# Patient Record
Sex: Female | Born: 1980 | Race: Black or African American | Hispanic: No | Marital: Single | Smoking: Current every day smoker
Health system: Southern US, Community
[De-identification: ages and names within clinical notes are randomized; demographics above are authoritative.]

## PROBLEM LIST (undated history)

## (undated) DIAGNOSIS — T7840XA Allergy, unspecified, initial encounter: Secondary | ICD-10-CM

## (undated) DIAGNOSIS — F329 Major depressive disorder, single episode, unspecified: Secondary | ICD-10-CM

## (undated) DIAGNOSIS — F32A Depression, unspecified: Secondary | ICD-10-CM

## (undated) DIAGNOSIS — F419 Anxiety disorder, unspecified: Secondary | ICD-10-CM

## (undated) DIAGNOSIS — I10 Essential (primary) hypertension: Secondary | ICD-10-CM

## (undated) DIAGNOSIS — E237 Disorder of pituitary gland, unspecified: Secondary | ICD-10-CM

## (undated) DIAGNOSIS — D509 Iron deficiency anemia, unspecified: Secondary | ICD-10-CM

## (undated) DIAGNOSIS — K5900 Constipation, unspecified: Secondary | ICD-10-CM

## (undated) DIAGNOSIS — E039 Hypothyroidism, unspecified: Secondary | ICD-10-CM

## (undated) DIAGNOSIS — I272 Pulmonary hypertension, unspecified: Secondary | ICD-10-CM

## (undated) HISTORY — DX: Disorder of pituitary gland, unspecified: E23.7

## (undated) HISTORY — DX: Pulmonary hypertension, unspecified: I27.20

## (undated) HISTORY — DX: Iron deficiency anemia, unspecified: D50.9

## (undated) HISTORY — PX: TUBAL LIGATION: SHX77

## (undated) HISTORY — PX: OTHER SURGICAL HISTORY: SHX169

## (undated) HISTORY — DX: Allergy, unspecified, initial encounter: T78.40XA

## (undated) HISTORY — PX: BREAST CYST EXCISION: SHX579

## (undated) HISTORY — PX: COLONOSCOPY: SHX174

## (undated) HISTORY — DX: Constipation, unspecified: K59.00

## (undated) HISTORY — PX: UPPER GASTROINTESTINAL ENDOSCOPY: SHX188

## (undated) HISTORY — DX: Hypothyroidism, unspecified: E03.9

---

## 2008-03-01 ENCOUNTER — Emergency Department (HOSPITAL_COMMUNITY): Admission: EM | Admit: 2008-03-01 | Discharge: 2008-03-02 | Payer: Self-pay | Admitting: Emergency Medicine

## 2010-12-23 ENCOUNTER — Encounter: Payer: Self-pay | Admitting: *Deleted

## 2010-12-23 ENCOUNTER — Emergency Department (INDEPENDENT_AMBULATORY_CARE_PROVIDER_SITE_OTHER): Payer: Medicaid Other

## 2010-12-23 ENCOUNTER — Emergency Department (HOSPITAL_BASED_OUTPATIENT_CLINIC_OR_DEPARTMENT_OTHER)
Admission: EM | Admit: 2010-12-23 | Discharge: 2010-12-23 | Disposition: A | Discharge status: Home / Self Care | Payer: Medicaid Other | Attending: Emergency Medicine | Admitting: Emergency Medicine

## 2010-12-23 DIAGNOSIS — F17200 Nicotine dependence, unspecified, uncomplicated: Secondary | ICD-10-CM | POA: Insufficient documentation

## 2010-12-23 DIAGNOSIS — IMO0001 Reserved for inherently not codable concepts without codable children: Secondary | ICD-10-CM | POA: Insufficient documentation

## 2010-12-23 DIAGNOSIS — I10 Essential (primary) hypertension: Secondary | ICD-10-CM | POA: Insufficient documentation

## 2010-12-23 DIAGNOSIS — M79609 Pain in unspecified limb: Secondary | ICD-10-CM

## 2010-12-23 DIAGNOSIS — M791 Myalgia, unspecified site: Secondary | ICD-10-CM

## 2010-12-23 DIAGNOSIS — R0602 Shortness of breath: Secondary | ICD-10-CM | POA: Insufficient documentation

## 2010-12-23 HISTORY — DX: Essential (primary) hypertension: I10

## 2010-12-23 NOTE — ED Notes (Signed)
Pt sts she is concerned about "discoloration" to LLE. Pt denies injury and sts "it just appeared when I was in the shower". Pt sts she feels short of breath since last night. Pt speaking in full sentences, sp02 100%, nad noted.

## 2010-12-23 NOTE — ED Notes (Signed)
Pt c/o left leg pain x 2 weeks with SOB x 2 days

## 2010-12-23 NOTE — ED Provider Notes (Signed)
History     Chief Complaint  Patient presents with  . Leg Pain  . Shortness of Breath   Patient is a 30 y.o. female presenting with leg pain. The history is provided by the patient.  Leg Pain  The incident occurred yesterday. The pain is present in the left leg. The quality of the pain is described as sharp. The pain is moderate. The pain has been constant since onset. Pertinent negatives include no numbness and no tingling. She reports no foreign bodies present. The symptoms are aggravated by palpation. She has tried nothing for the symptoms.  pt c/o left lower leg pain for past day. No hx same. Denies injury. No swelling. No cp or sob. No hx dvt or pe.   Past Medical History  Diagnosis Date  . Hypertension     Past Surgical History  Procedure Date  . Cesarean section   . Breast surgery   . Tubal ligation     History reviewed. No pertinent family history.  History  Substance Use Topics  . Smoking status: Current Everyday Smoker -- 0.5 packs/day  . Smokeless tobacco: Not on file  . Alcohol Use: No    OB History    Grav Para Term Preterm Abortions TAB SAB Ect Mult Living                  Review of Systems  Constitutional: Negative for fever and chills.  HENT: Negative for neck pain.   Eyes: Negative for redness.  Respiratory: Negative for shortness of breath.   Cardiovascular: Negative for chest pain.  Gastrointestinal: Negative for abdominal pain.  Genitourinary: Negative for dysuria.  Skin: Negative for rash.  Neurological: Negative for tingling, numbness and headaches.  Hematological: Does not bruise/bleed easily.  Psychiatric/Behavioral: Negative for behavioral problems.    Physical Exam  BP 138/87  Pulse 94  Temp 98.9 F (37.2 C)  Resp 18  Wt 195 lb (88.451 kg)  SpO2 100%  LMP 12/09/2010  Physical Exam  Nursing note and vitals reviewed. Constitutional: She appears well-developed and well-nourished. No distress.  Eyes: Conjunctivae are normal. No  scleral icterus.  Neck: Neck supple. No tracheal deviation present.  Cardiovascular: Normal rate and normal heart sounds.  Exam reveals no gallop and no friction rub.   No murmur heard. Pulmonary/Chest: Effort normal and breath sounds normal. No respiratory distress.  Abdominal: Normal appearance. She exhibits no distension.  Musculoskeletal: She exhibits no edema.       Tenderness left lower leg laterally. No skin changes or erythema. No sts. No palp cord. Distal pulses palp. Good rom at knee/ankle without pain  Neurological: She is alert.  Skin: Skin is warm and dry. No rash noted.  Psychiatric: She has a normal mood and affect.    ED Course  Procedures  MDM Vascular dopplers ordered Dopplers neg for dvt. Discussed results w pt. No sob/ no cp.      Suzi Roots, MD 12/23/10 [phone]

## 2011-04-28 ENCOUNTER — Other Ambulatory Visit: Payer: Self-pay | Admitting: Internal Medicine

## 2011-04-28 DIAGNOSIS — E010 Iodine-deficiency related diffuse (endemic) goiter: Secondary | ICD-10-CM

## 2011-05-11 ENCOUNTER — Other Ambulatory Visit: Payer: Self-pay

## 2011-05-11 ENCOUNTER — Ambulatory Visit
Admission: RE | Admit: 2011-05-11 | Discharge: 2011-05-11 | Disposition: A | Discharge status: Home / Self Care | Payer: Medicaid Other | Source: Ambulatory Visit | Attending: Internal Medicine | Admitting: Internal Medicine

## 2011-05-11 DIAGNOSIS — E010 Iodine-deficiency related diffuse (endemic) goiter: Secondary | ICD-10-CM

## 2011-06-07 ENCOUNTER — Emergency Department (INDEPENDENT_AMBULATORY_CARE_PROVIDER_SITE_OTHER): Payer: Medicaid Other

## 2011-06-07 ENCOUNTER — Emergency Department (HOSPITAL_BASED_OUTPATIENT_CLINIC_OR_DEPARTMENT_OTHER)
Admission: EM | Admit: 2011-06-07 | Discharge: 2011-06-07 | Disposition: A | Discharge status: Home / Self Care | Payer: Medicaid Other | Attending: Emergency Medicine | Admitting: Emergency Medicine

## 2011-06-07 ENCOUNTER — Encounter (HOSPITAL_BASED_OUTPATIENT_CLINIC_OR_DEPARTMENT_OTHER): Payer: Self-pay | Admitting: Emergency Medicine

## 2011-06-07 DIAGNOSIS — M898X9 Other specified disorders of bone, unspecified site: Secondary | ICD-10-CM

## 2011-06-07 DIAGNOSIS — I10 Essential (primary) hypertension: Secondary | ICD-10-CM | POA: Insufficient documentation

## 2011-06-07 DIAGNOSIS — M7989 Other specified soft tissue disorders: Secondary | ICD-10-CM

## 2011-06-07 DIAGNOSIS — F17200 Nicotine dependence, unspecified, uncomplicated: Secondary | ICD-10-CM | POA: Insufficient documentation

## 2011-06-07 DIAGNOSIS — M25669 Stiffness of unspecified knee, not elsewhere classified: Secondary | ICD-10-CM

## 2011-06-07 DIAGNOSIS — M25462 Effusion, left knee: Secondary | ICD-10-CM

## 2011-06-07 DIAGNOSIS — M25469 Effusion, unspecified knee: Secondary | ICD-10-CM | POA: Insufficient documentation

## 2011-06-07 DIAGNOSIS — M25569 Pain in unspecified knee: Secondary | ICD-10-CM

## 2011-06-07 MED ORDER — IBUPROFEN 800 MG PO TABS: 800.0000 mg | ORAL_TABLET | Freq: Three times a day (TID) | ORAL | Status: AC

## 2011-06-07 NOTE — ED Provider Notes (Signed)
History     CSN: [id number]  Arrival date & time 06/07/11  1623   First MD Initiated Contact with Patient 06/07/11 1658      Chief Complaint  Patient presents with  . Knee Pain    (Consider location/radiation/quality/duration/timing/severity/associated sxs/prior treatment) Patient is a 30 y.o. female presenting with knee pain. The history is provided by the patient. No language interpreter was used.  Knee Pain This is a new problem. The current episode started yesterday. The problem occurs constantly. The problem has been gradually worsening. Associated symptoms include joint swelling. Pertinent negatives include no fever or rash. The symptoms are aggravated by bending. She has tried nothing for the symptoms.  Pt reports left knee is swollen and painful.  Pt denies any injury.  Past Medical History  Diagnosis Date  . Hypertension     Past Surgical History  Procedure Date  . Cesarean section   . Breast surgery   . Tubal ligation     No family history on file.  History  Substance Use Topics  . Smoking status: Current Everyday Smoker -- 0.5 packs/day  . Smokeless tobacco: Not on file  . Alcohol Use: No    OB History    Grav Para Term Preterm Abortions TAB SAB Ect Mult Living                  Review of Systems  Constitutional: Negative for fever.  Musculoskeletal: Positive for joint swelling.  Skin: Negative for rash.  All other systems reviewed and are negative.    Allergies  Codeine; Darvocet; Percocet; and Tylenol  Home Medications   Current Outpatient Rx  Name Route Sig Dispense Refill  . BIOTIN 10 MG PO TABS Oral Take 1 tablet by mouth daily.      Marland Kitchen ONE-DAILY MULTI VITAMINS PO TABS Oral Take 1 tablet by mouth daily.        BP 143/86  Pulse 78  Temp(Src) 98.5 F (36.9 C) (Oral)  Resp 16  SpO2 100%  LMP 06/07/2011  Physical Exam  Nursing note and vitals reviewed. Constitutional: She appears well-developed and well-nourished.  HENT:  Head:  Normocephalic and atraumatic.  Eyes: Pupils are equal, round, and reactive to light.  Musculoskeletal: She exhibits edema and tenderness.  Neurological: She is alert.  Skin: Skin is warm.  Psychiatric: She has a normal mood and affect.    ED Course  Procedures (including critical care time)  Labs Reviewed - No data to display Dg Knee Complete 4 Views Left  06/07/2011  *RADIOLOGY REPORT*  Clinical Data: 30 year old female with left anterior knee pain, swelling and stiffness.  LEFT KNEE - COMPLETE 4+ VIEW  Comparison: None.  Findings: No definite joint effusion.  Degenerative spurring of the patella.  Joint spaces appear relatively preserved.  Mild medial compartment spurring.  No acute fracture or dislocation.  IMPRESSION: Mild degenerative spurring. No acute osseous abnormality identified about the left knee.  Original Report Authenticated By: Harley Hallmark, M.D.     No diagnosis found.    MDM  Pt placed in knee imbolizer,  I advised follow up with orthopaedist for recheck.  rx for [employer], [place] 06/07/11 1750

## 2011-06-07 NOTE — ED Notes (Signed)
Pt c/o pain to LT knee & edema since yesterday, no known injury; also c/o numbess, tingling in hands intermittently x "weeks"

## 2011-06-08 NOTE — ED Provider Notes (Signed)
History/physical exam/procedure(s) were performed by non-physician practitioner and as supervising physician I was immediately available for consultation/collaboration. I have reviewed all notes and am in agreement with care and plan.   Bransen Fassnacht S Evan Mackie, MD 06/08/11 0720 

## 2011-06-26 ENCOUNTER — Encounter (HOSPITAL_BASED_OUTPATIENT_CLINIC_OR_DEPARTMENT_OTHER): Payer: Self-pay | Admitting: *Deleted

## 2011-06-26 ENCOUNTER — Emergency Department (HOSPITAL_BASED_OUTPATIENT_CLINIC_OR_DEPARTMENT_OTHER)
Admission: EM | Admit: 2011-06-26 | Discharge: 2011-06-26 | Disposition: A | Discharge status: Home / Self Care | Payer: Medicaid Other | Attending: Emergency Medicine | Admitting: Emergency Medicine

## 2011-06-26 DIAGNOSIS — N9489 Other specified conditions associated with female genital organs and menstrual cycle: Secondary | ICD-10-CM | POA: Insufficient documentation

## 2011-06-26 DIAGNOSIS — R102 Pelvic and perineal pain: Secondary | ICD-10-CM

## 2011-06-26 DIAGNOSIS — N760 Acute vaginitis: Secondary | ICD-10-CM | POA: Insufficient documentation

## 2011-06-26 DIAGNOSIS — A499 Bacterial infection, unspecified: Secondary | ICD-10-CM | POA: Insufficient documentation

## 2011-06-26 DIAGNOSIS — F17200 Nicotine dependence, unspecified, uncomplicated: Secondary | ICD-10-CM | POA: Insufficient documentation

## 2011-06-26 DIAGNOSIS — I10 Essential (primary) hypertension: Secondary | ICD-10-CM | POA: Insufficient documentation

## 2011-06-26 DIAGNOSIS — B9689 Other specified bacterial agents as the cause of diseases classified elsewhere: Secondary | ICD-10-CM | POA: Insufficient documentation

## 2011-06-26 DIAGNOSIS — R109 Unspecified abdominal pain: Secondary | ICD-10-CM | POA: Insufficient documentation

## 2011-06-26 LAB — URINALYSIS, ROUTINE W REFLEX MICROSCOPIC
Glucose, UA: NEGATIVE mg/dL
Hgb urine dipstick: NEGATIVE
Specific Gravity, Urine: 1.015 (ref 1.005–1.030)

## 2011-06-26 LAB — WET PREP, GENITAL
Trich, Wet Prep: NONE SEEN
Yeast Wet Prep HPF POC: NONE SEEN

## 2011-06-26 LAB — PREGNANCY, URINE: Preg Test, Ur: NEGATIVE

## 2011-06-26 MED ORDER — DOXYCYCLINE HYCLATE 100 MG PO CAPS: 100.0000 mg | ORAL_CAPSULE | Freq: Two times a day (BID) | ORAL | Status: AC

## 2011-06-26 MED ORDER — METRONIDAZOLE 500 MG PO TABS: 500.0000 mg | ORAL_TABLET | Freq: Two times a day (BID) | ORAL | Status: AC

## 2011-06-26 MED ORDER — FLUCONAZOLE 200 MG PO TABS: 200.0000 mg | ORAL_TABLET | Freq: Every day | ORAL | Status: AC

## 2011-06-26 MED ORDER — TRAMADOL HCL 50 MG PO TABS: 50.0000 mg | ORAL_TABLET | Freq: Four times a day (QID) | ORAL | Status: AC | PRN

## 2011-06-26 MED ORDER — CEFTRIAXONE SODIUM 250 MG IJ SOLR
250.0000 mg | Freq: Once | INTRAMUSCULAR | Status: AC
  Administered 2011-06-26: 250 mg via INTRAMUSCULAR
  Filled 2011-06-26: qty 250

## 2011-06-26 NOTE — ED Provider Notes (Signed)
History     CSN: [id number]  Arrival date & time 06/26/11  4098   First MD Initiated Contact with Patient 06/26/11 1002      Chief Complaint  Patient presents with  . Abdominal Pain    (Consider location/radiation/quality/duration/timing/severity/associated sxs/prior treatment) HPI Comments: Patient presents with low back pain across both sides of her low back. This started about 2 weeks ago. It is worse after intercourse. Is not worse with movement. Also complains now of some crampy pain to her lower abdomen. Denies any vaginal bleeding. She does complain of some vaginal discharge. No nausea no vomiting no fevers no difficulty urinating. She was recently treated for bacterial vaginosis and yeast infection by her primary care physician. She currently is essentially active. Denies any abnormal periods. She is status post a tubal ligation.  Patient is a 31 y.o. female presenting with abdominal pain. The history is provided by the patient.  Abdominal Pain The primary symptoms of the illness include abdominal pain and vaginal discharge. The primary symptoms of the illness do not include fever, fatigue, shortness of breath, nausea, vomiting, diarrhea or vaginal bleeding.  Symptoms associated with the illness do not include chills, diaphoresis, hematuria, frequency or back pain.    Past Medical History  Diagnosis Date  . Hypertension     Past Surgical History  Procedure Date  . Cesarean section   . Breast surgery   . Tubal ligation     History reviewed. No pertinent family history.  History  Substance Use Topics  . Smoking status: Current Everyday Smoker -- 0.5 packs/day  . Smokeless tobacco: Not on file  . Alcohol Use: Yes    OB History    Grav Para Term Preterm Abortions TAB SAB Ect Mult Living                  Review of Systems  Constitutional: Negative for fever, chills, diaphoresis and fatigue.  HENT: Negative for congestion, rhinorrhea and sneezing.   Eyes:  Negative.   Respiratory: Negative for cough, chest tightness and shortness of breath.   Cardiovascular: Negative for chest pain and leg swelling.  Gastrointestinal: Positive for abdominal pain. Negative for nausea, vomiting, diarrhea and blood in stool.  Genitourinary: Positive for vaginal discharge. Negative for frequency, hematuria, flank pain, vaginal bleeding and difficulty urinating.  Musculoskeletal: Negative for back pain and arthralgias.  Skin: Negative for rash.  Neurological: Negative for dizziness, speech difficulty, weakness, numbness and headaches.    Allergies  Codeine; Darvocet; Percocet; and Tylenol  Home Medications   Current Outpatient Rx  Name Route Sig Dispense Refill  . BIOTIN 10 MG PO TABS Oral Take 1 tablet by mouth daily.      Marland Kitchen DOXYCYCLINE HYCLATE 100 MG PO CAPS Oral Take 1 capsule (100 mg total) by mouth 2 (two) times daily. 20 capsule 0  . METRONIDAZOLE 500 MG PO TABS Oral Take 1 tablet (500 mg total) by mouth 2 (two) times daily. 14 tablet 0  . ONE-DAILY MULTI VITAMINS PO TABS Oral Take 1 tablet by mouth daily.      . TRAMADOL HCL 50 MG PO TABS Oral Take 1 tablet (50 mg total) by mouth every 6 (six) hours as needed for pain. 15 tablet 0    BP 133/106  Pulse 106  Temp(Src) 98.7 F (37.1 C) (Oral)  Resp 18  SpO2 100%  LMP 06/07/2011  Physical Exam  Constitutional: She is oriented to person, place, and time. She appears well-developed and well-nourished.  HENT:  Head: Normocephalic and atraumatic.  Eyes: Pupils are equal, round, and reactive to light.  Neck: Normal range of motion. Neck supple.  Cardiovascular: Normal rate, regular rhythm and normal heart sounds.   Pulmonary/Chest: Effort normal and breath sounds normal. No respiratory distress. She has no wheezes. She has no rales. She exhibits no tenderness.  Abdominal: Soft. Bowel sounds are normal. There is no tenderness. There is no rebound and no guarding.  Genitourinary: Vaginal discharge  found.       Patient has moderate amount of white discharge. No vaginal bleeding. No cervical motion tenderness. No adnexal tenderness.  Musculoskeletal: Normal range of motion. She exhibits no edema.  Lymphadenopathy:    She has no cervical adenopathy.  Neurological: She is alert and oriented to person, place, and time.  Skin: Skin is warm and dry. No rash noted.  Psychiatric: She has a normal mood and affect.    ED Course  Procedures (including critical care time)  Results for orders placed during the hospital encounter of 06/26/11  URINALYSIS, ROUTINE W REFLEX MICROSCOPIC      Component Value Range   Color, Urine YELLOW  YELLOW    APPearance HAZY (*) CLEAR    Specific Gravity, Urine 1.015  1.005 - 1.030    pH 5.5  5.0 - 8.0    Glucose, UA NEGATIVE  NEGATIVE (mg/dL)   Hgb urine dipstick NEGATIVE  NEGATIVE    Bilirubin Urine NEGATIVE  NEGATIVE    Ketones, ur NEGATIVE  NEGATIVE (mg/dL)   Protein, ur NEGATIVE  NEGATIVE (mg/dL)   Urobilinogen, UA 0.2  0.0 - 1.0 (mg/dL)   Nitrite NEGATIVE  NEGATIVE    Leukocytes, UA NEGATIVE  NEGATIVE   PREGNANCY, URINE      Component Value Range   Preg Test, Ur NEGATIVE    WET PREP, GENITAL      Component Value Range   Yeast, Wet Prep NONE SEEN  NONE SEEN    Trich, Wet Prep NONE SEEN  NONE SEEN    Clue Cells, Wet Prep FEW (*) NONE SEEN    WBC, Wet Prep HPF POC FEW (*) NONE SEEN    Dg Knee Complete 4 Views Left  06/07/2011  *RADIOLOGY REPORT*  Clinical Data: 31 year old female with left anterior knee pain, swelling and stiffness.  LEFT KNEE - COMPLETE 4+ VIEW  Comparison: None.  Findings: No definite joint effusion.  Degenerative spurring of the patella.  Joint spaces appear relatively preserved.  Mild medial compartment spurring.  No acute fracture or dislocation.  IMPRESSION: Mild degenerative spurring. No acute osseous abnormality identified about the left knee.  Original Report Authenticated By: Harley Hallmark, M.D.     No results  found.   1. Pelvic pain in female   2. Bacterial vaginosis       MDM  Patient with tenderness to pelvic area with some mild tenderness to cervix but no true cervical motion tenderness. No evidence of UTI. Exam not consistent with appendicitis, ovarian torsion, or ovarian cyst. Pain seems being mostly midline. Will treat for cervicitis and vaginitis. Will have patient followup with her primary care physician Dr. Lovell Sheehan or return here for any worsening symptoms.       Rolan Bucco, MD 06/26/11 1151

## 2011-06-26 NOTE — ED Notes (Signed)
Patient states she was treated for yeast infection and given diflucan, states within the last two weeks she has back pain after intercourse, vaginal discharge

## 2011-06-29 LAB — GC/CHLAMYDIA PROBE AMP, GENITAL
Chlamydia, DNA Probe: NEGATIVE
GC Probe Amp, Genital: NEGATIVE

## 2011-10-04 ENCOUNTER — Encounter (HOSPITAL_BASED_OUTPATIENT_CLINIC_OR_DEPARTMENT_OTHER): Payer: Self-pay | Admitting: *Deleted

## 2011-10-04 ENCOUNTER — Emergency Department (HOSPITAL_BASED_OUTPATIENT_CLINIC_OR_DEPARTMENT_OTHER)
Admission: EM | Admit: 2011-10-04 | Discharge: 2011-10-04 | Disposition: A | Discharge status: Home / Self Care | Payer: Medicaid Other | Attending: Emergency Medicine | Admitting: Emergency Medicine

## 2011-10-04 ENCOUNTER — Emergency Department (INDEPENDENT_AMBULATORY_CARE_PROVIDER_SITE_OTHER): Payer: Medicaid Other

## 2011-10-04 DIAGNOSIS — N898 Other specified noninflammatory disorders of vagina: Secondary | ICD-10-CM | POA: Insufficient documentation

## 2011-10-04 DIAGNOSIS — I10 Essential (primary) hypertension: Secondary | ICD-10-CM | POA: Insufficient documentation

## 2011-10-04 DIAGNOSIS — R1032 Left lower quadrant pain: Secondary | ICD-10-CM

## 2011-10-04 DIAGNOSIS — R109 Unspecified abdominal pain: Secondary | ICD-10-CM | POA: Insufficient documentation

## 2011-10-04 DIAGNOSIS — N946 Dysmenorrhea, unspecified: Secondary | ICD-10-CM | POA: Insufficient documentation

## 2011-10-04 DIAGNOSIS — Z79899 Other long term (current) drug therapy: Secondary | ICD-10-CM | POA: Insufficient documentation

## 2011-10-04 DIAGNOSIS — N939 Abnormal uterine and vaginal bleeding, unspecified: Secondary | ICD-10-CM

## 2011-10-04 LAB — WET PREP, GENITAL: Clue Cells Wet Prep HPF POC: NONE SEEN

## 2011-10-04 LAB — PREGNANCY, URINE: Preg Test, Ur: NEGATIVE

## 2011-10-04 LAB — URINALYSIS, ROUTINE W REFLEX MICROSCOPIC
Bilirubin Urine: NEGATIVE
Glucose, UA: NEGATIVE mg/dL
Hgb urine dipstick: NEGATIVE
Ketones, ur: NEGATIVE mg/dL
Protein, ur: NEGATIVE mg/dL

## 2011-10-04 MED ORDER — KETOROLAC TROMETHAMINE 60 MG/2ML IM SOLN
60.0000 mg | Freq: Once | INTRAMUSCULAR | Status: AC
  Administered 2011-10-04: 60 mg via INTRAMUSCULAR
  Filled 2011-10-04: qty 2

## 2011-10-04 MED ORDER — TRAMADOL HCL 50 MG PO TABS: 50.0000 mg | ORAL_TABLET | Freq: Four times a day (QID) | ORAL | Status: AC | PRN

## 2011-10-04 MED ORDER — IBUPROFEN 800 MG PO TABS: 800.0000 mg | ORAL_TABLET | Freq: Three times a day (TID) | ORAL | Status: AC

## 2011-10-04 MED ORDER — IBUPROFEN 800 MG PO TABS: 800.0000 mg | ORAL_TABLET | Freq: Three times a day (TID) | ORAL | Status: DC

## 2011-10-04 MED ORDER — TRAMADOL HCL 50 MG PO TABS: 50.0000 mg | ORAL_TABLET | Freq: Four times a day (QID) | ORAL | Status: DC | PRN

## 2011-10-04 NOTE — ED Provider Notes (Signed)
History     CSN: [id number]  Arrival date & time 10/04/11  1624   First MD Initiated Contact with Patient 10/04/11 1719      No chief complaint on file.   (Consider location/radiation/quality/duration/timing/severity/associated sxs/prior treatment) Patient is a 31 y.o. female presenting with abdominal pain. The history is provided by the patient. No language interpreter was used.  Abdominal Pain The primary symptoms of the illness include abdominal pain and vaginal bleeding. The current episode started 13 to 24 hours ago. The onset of the illness was sudden. The problem has been gradually worsening.  Associated with: nothing. The patient states that she believes she is currently not pregnant. The patient has not had a change in bowel habit. Symptoms associated with the illness do not include back pain.    Past Medical History  Diagnosis Date  . Hypertension     Past Surgical History  Procedure Date  . Cesarean section   . Breast surgery   . Tubal ligation     No family history on file.  History  Substance Use Topics  . Smoking status: Current Everyday Smoker -- 0.5 packs/day  . Smokeless tobacco: Not on file  . Alcohol Use: Yes    OB History    Grav Para Term Preterm Abortions TAB SAB Ect Mult Living                  Review of Systems  Gastrointestinal: Positive for abdominal pain.  Genitourinary: Positive for vaginal bleeding.  Musculoskeletal: Negative for back pain.  All other systems reviewed and are negative.    Allergies  Codeine; Darvocet; Percocet; and Tylenol  Home Medications   Current Outpatient Rx  Name Route Sig Dispense Refill  . IBUPROFEN 800 MG PO TABS Oral Take 800 mg by mouth every 8 (eight) hours as needed. Patient used this medication for her pain.    Marland Kitchen BIOTIN 10 MG PO TABS Oral Take 1 tablet by mouth daily.        BP 117/62  Pulse 79  Temp(Src) 98.2 F (36.8 C) (Oral)  Resp 20  SpO2 99%  LMP 10/04/2011  Physical Exam    Nursing note and vitals reviewed. Constitutional: She is oriented to person, place, and time. She appears well-developed and well-nourished.  HENT:  Head: Normocephalic and atraumatic.  Eyes: Conjunctivae and EOM are normal. Pupils are equal, round, and reactive to light.  Neck: Normal range of motion. Neck supple.  Cardiovascular: Normal rate and normal heart sounds.   Pulmonary/Chest: Effort normal.  Abdominal: Soft. There is tenderness.  Genitourinary: Uterus normal.       Vaginal bleeding moderate amount,  Tender left adnexa    Musculoskeletal: She exhibits no tenderness.  Neurological: She is alert and oriented to person, place, and time. She has normal reflexes.  Skin: Skin is warm and dry.  Psychiatric: She has a normal mood and affect.    ED Course  Procedures (including critical care time)  Labs Reviewed - No data to display No results found.   No diagnosis found.    MDM   Results for orders placed during the hospital encounter of 10/04/11  URINALYSIS, ROUTINE W REFLEX MICROSCOPIC      Component Value Range   Color, Urine YELLOW  YELLOW    APPearance CLEAR  CLEAR    Specific Gravity, Urine 1.018  1.005 - 1.030    pH 8.0  5.0 - 8.0    Glucose, UA NEGATIVE  NEGATIVE (mg/dL)  Hgb urine dipstick NEGATIVE  NEGATIVE    Bilirubin Urine NEGATIVE  NEGATIVE    Ketones, ur NEGATIVE  NEGATIVE (mg/dL)   Protein, ur NEGATIVE  NEGATIVE (mg/dL)   Urobilinogen, UA 0.2  0.0 - 1.0 (mg/dL)   Nitrite NEGATIVE  NEGATIVE    Leukocytes, UA NEGATIVE  NEGATIVE   PREGNANCY, URINE      Component Value Range   Preg Test, Ur NEGATIVE  NEGATIVE   WET PREP, GENITAL      Component Value Range   Yeast Wet Prep HPF POC NONE SEEN  NONE SEEN    Trich, Wet Prep NONE SEEN  NONE SEEN    Clue Cells Wet Prep HPF POC NONE SEEN  NONE SEEN    WBC, Wet Prep HPF POC FEW (*) NONE SEEN    US Transvaginal Non-ob  10/04/2011  *RADIOLOGY REPORT*  Clinical Data: Left lower quadrant pain   TRANSABDOMINAL AND TRANSVAGINAL ULTRASOUND OF PELVIS Technique:  Both transabdominal and transvaginal ultrasound examinations of the pelvis were performed. Transabdominal technique was performed for global imaging of the pelvis including uterus, ovaries, adnexal regions, and pelvic cul-de-sac.  Comparison: None   It was necessary to proceed with endovaginal exam following the transabdominal exam to visualize the endometrial complex and ovaries.  Findings:  Uterus: Normal position. 9.2 x 5.7 x 6.0 cm in size.  No focal mass.  Endometrium: Prominent at 13 mm thick.  No endometrial fluid.  Right ovary:  2.8 x 2.5 x 2.6 cm in size.  Question small paraovarian cyst 1.8 x 1.3 x 1.4 cm.  Left ovary: 3.2 x 2.5 x 2.9 cm.  Normal morphology without mass.  Other findings: Trace free pelvic fluid.  IMPRESSION: Minimally prominent endometrial complex 13 mm thick, nonspecific. Question small right paraovarian cyst 1.8 cm greatest size. No cause for left lower quadrant pain identified.  Original Report Authenticated By: Lollie Marrow, M.D.   US Pelvis Complete  10/04/2011  *RADIOLOGY REPORT*  Clinical Data: Left lower quadrant pain  TRANSABDOMINAL AND TRANSVAGINAL ULTRASOUND OF PELVIS Technique:  Both transabdominal and transvaginal ultrasound examinations of the pelvis were performed. Transabdominal technique was performed for global imaging of the pelvis including uterus, ovaries, adnexal regions, and pelvic cul-de-sac.  Comparison: None   It was necessary to proceed with endovaginal exam following the transabdominal exam to visualize the endometrial complex and ovaries.  Findings:  Uterus: Normal position. 9.2 x 5.7 x 6.0 cm in size.  No focal mass.  Endometrium: Prominent at 13 mm thick.  No endometrial fluid.  Right ovary:  2.8 x 2.5 x 2.6 cm in size.  Question small paraovarian cyst 1.8 x 1.3 x 1.4 cm.  Left ovary: 3.2 x 2.5 x 2.9 cm.  Normal morphology without mass.  Other findings: Trace free pelvic fluid.  IMPRESSION:  Minimally prominent endometrial complex 13 mm thick, nonspecific. Question small right paraovarian cyst 1.8 cm greatest size. No cause for left lower quadrant pain identified.  Original Report Authenticated By: Lollie Marrow, M.D.          Lonia Skinner [place], [place] 10/04/11 2043

## 2011-10-04 NOTE — Discharge Instructions (Signed)
Dysmenorrhea Menstrual pain is caused by the muscles of the uterus tightening (contracting) during a menstrual period. The muscles of the uterus contract due to the chemicals in the uterine lining. Primary dysmenorrhea is menstrual cramps that last a couple of days when you start having menstrual periods or soon after. This often begins after a teenager starts having her period. As a woman gets older or has a baby, the cramps will usually lesson or disappear. Secondary dysmenorrhea begins later in life, lasts longer, and the pain may be stronger than primary dysmenorrhea. The pain may start before the period and last a few days after the period. This type of dysmenorrhea is usually caused by an underlying problem such as:  The tissue lining the uterus grows outside of the uterus in other areas of the body (endometriosis).   The endometrial tissue, which normally lines the uterus, is found in or grows into the muscular walls of the uterus (adenomyosis).   The pelvic blood vessels are engorged with blood just before the menstrual period (pelvic congestive syndrome).   Overgrowth of cells in the lining of the uterus or cervix (polyps of the uterus or cervix).   Falling down of the uterus (prolapse) because of loose or stretched ligaments.   Depression.   Bladder problems, infection, or inflammation.   Problems with the intestine, a tumor, or irritable bowel syndrome.   Cancer of the female organs or bladder.   A severely tipped uterus.   A very tight opening or closed cervix.   Noncancerous tumors of the uterus (fibroids).   Pelvic inflammatory disease (PID).   Pelvic scarring (adhesions) from a previous surgery.   Ovarian cyst.   An intrauterine device (IUD) used for birth control.  CAUSES  The cause of menstrual pain is often unknown. SYMPTOMS   Cramping or throbbing pain in your lower abdomen.   Sometimes, a woman may also experience headaches.   Lower back pain.    Feeling sick to your stomach (nausea) or vomiting.   Diarrhea.   Sweating or dizziness.  DIAGNOSIS  A diagnosis is based on your history, symptoms, physical examination, diagnostic tests, or procedures. Diagnostic tests or procedures may include:  Blood tests.   An ultrasound.   An examination of the lining of the uterus (dilation and curettage, D&C).   An examination inside your abdomen or pelvis with a scope (laparoscopy).   X-rays.   CT Scan.   MRI.   An examination inside the bladder with a scope (cystoscopy).   An examination inside the intestine or stomach with a scope (colonoscopy, gastroscopy).  TREATMENT  Treatment depends on the cause of the dysmenorrhea. Treatment may include:  Pain medicine prescribed by your caregiver.   Birth control pills.   Hormone replacement therapy.   Nonsteroidal anti-inflammatory drugs (NSAIDs). These may help stop the production of prostaglandins.   An IUD with progesterone hormone in it.   Acupuncture.   Surgery to remove adhesions, endometriosis, ovarian cyst, or fibroids.   Removal of the uterus (hysterectomy).   Progesterone shots to stop the menstrual period.   Cutting the nerves on the sacrum that go to the female organs (presacral neurectomy).   Electric currant to the sacral nerves (sacral nerve stimulation).   Antidepressant medicine.   Psychiatric therapy, counseling, or group therapy.   Exercise and physical therapy.   Meditation and yoga therapy.  HOME CARE INSTRUCTIONS   Only take over-the-counter or prescription medicines for pain, discomfort, or fever as directed by your   caregiver.   Place a heating pad or hot water bottle on your lower back or abdomen. Do not sleep with the heating pad.   Use aerobic exercises, walking, swimming, biking, and other exercises to help lessen the cramping.   Massage to the lower back or abdomen may help.   Stop smoking.   Avoid alcohol and caffeine.   Yoga,  meditation, or acupuncture may help.  SEEK MEDICAL CARE IF:   The pain does not get better with medicine.   You have pain with sexual intercourse.  SEEK IMMEDIATE MEDICAL CARE IF:   Your pain increases and is not controlled with medicines.   You have a fever.   You develop nausea or vomiting with your period not controlled with medicine.   You have abnormal vaginal bleeding with your period.   You pass out.  MAKE SURE YOU:   Understand these instructions.   Will watch your condition.   Will get help right away if you are not doing well or get worse.  Document Released: 05/24/2005 Document Revised: 05/13/2011 Document Reviewed: 09/09/2008 [employer] Patient Information 2012 [employer]. 

## 2011-10-04 NOTE — ED Notes (Signed)
Abdominal cramps and menses woke her at 2am.

## 2011-10-04 NOTE — ED Notes (Signed)
rx x 2 given at d/c for ibuprofen and tramadol- d/c home with a ride

## 2011-10-04 NOTE — ED Provider Notes (Signed)
Medical screening examination/treatment/procedure(s) were performed by non-physician practitioner and as supervising physician I was immediately available for consultation/collaboration.   Leigh-Ann Madelon Welsch, MD 10/04/11 2055 

## 2011-10-04 NOTE — ED Notes (Signed)
Patient reports "dying in pain"; PA made aware.

## 2011-10-05 LAB — GC/CHLAMYDIA PROBE AMP, GENITAL
Chlamydia, DNA Probe: NEGATIVE
GC Probe Amp, Genital: NEGATIVE

## 2011-10-19 ENCOUNTER — Other Ambulatory Visit (HOSPITAL_COMMUNITY)
Admission: RE | Admit: 2011-10-19 | Discharge: 2011-10-19 | Disposition: A | Discharge status: Home / Self Care | Payer: Medicaid Other | Source: Ambulatory Visit | Attending: Obstetrics and Gynecology | Admitting: Obstetrics and Gynecology

## 2011-10-19 ENCOUNTER — Other Ambulatory Visit: Payer: Self-pay | Admitting: Obstetrics and Gynecology

## 2011-10-19 DIAGNOSIS — Z113 Encounter for screening for infections with a predominantly sexual mode of transmission: Secondary | ICD-10-CM | POA: Insufficient documentation

## 2013-01-18 ENCOUNTER — Other Ambulatory Visit: Payer: Self-pay | Admitting: Internal Medicine

## 2013-01-18 DIAGNOSIS — E042 Nontoxic multinodular goiter: Secondary | ICD-10-CM

## 2013-01-19 ENCOUNTER — Ambulatory Visit
Admission: RE | Admit: 2013-01-19 | Discharge: 2013-01-19 | Disposition: A | Discharge status: Home / Self Care | Payer: Medicaid Other | Source: Ambulatory Visit | Attending: Internal Medicine | Admitting: Internal Medicine

## 2013-01-19 DIAGNOSIS — E042 Nontoxic multinodular goiter: Secondary | ICD-10-CM

## 2013-01-25 ENCOUNTER — Other Ambulatory Visit: Payer: Medicaid Other

## 2013-04-06 ENCOUNTER — Emergency Department (HOSPITAL_BASED_OUTPATIENT_CLINIC_OR_DEPARTMENT_OTHER)
Admission: EM | Admit: 2013-04-06 | Discharge: 2013-04-06 | Disposition: A | Discharge status: Home / Self Care | Payer: Medicaid Other | Attending: Emergency Medicine | Admitting: Emergency Medicine

## 2013-04-06 ENCOUNTER — Encounter (HOSPITAL_BASED_OUTPATIENT_CLINIC_OR_DEPARTMENT_OTHER): Payer: Self-pay | Admitting: Emergency Medicine

## 2013-04-06 DIAGNOSIS — F329 Major depressive disorder, single episode, unspecified: Secondary | ICD-10-CM | POA: Insufficient documentation

## 2013-04-06 DIAGNOSIS — I10 Essential (primary) hypertension: Secondary | ICD-10-CM | POA: Insufficient documentation

## 2013-04-06 DIAGNOSIS — Z79899 Other long term (current) drug therapy: Secondary | ICD-10-CM | POA: Insufficient documentation

## 2013-04-06 DIAGNOSIS — F17200 Nicotine dependence, unspecified, uncomplicated: Secondary | ICD-10-CM | POA: Insufficient documentation

## 2013-04-06 DIAGNOSIS — F3289 Other specified depressive episodes: Secondary | ICD-10-CM | POA: Insufficient documentation

## 2013-04-06 DIAGNOSIS — M62838 Other muscle spasm: Secondary | ICD-10-CM

## 2013-04-06 DIAGNOSIS — F411 Generalized anxiety disorder: Secondary | ICD-10-CM | POA: Insufficient documentation

## 2013-04-06 HISTORY — DX: Major depressive disorder, single episode, unspecified: F32.9

## 2013-04-06 HISTORY — DX: Depression, unspecified: F32.A

## 2013-04-06 HISTORY — DX: Anxiety disorder, unspecified: F41.9

## 2013-04-06 MED ORDER — TRAMADOL HCL 50 MG PO TABS: 50.0000 mg | ORAL_TABLET | Freq: Four times a day (QID) | ORAL | Status: DC | PRN

## 2013-04-06 MED ORDER — DIAZEPAM 5 MG PO TABS: 5.0000 mg | ORAL_TABLET | Freq: Four times a day (QID) | ORAL | Status: DC | PRN

## 2013-04-06 NOTE — ED Notes (Signed)
Pt states was in class yesterday and neck started hurting on right side. Pt state today hurting on both sides of neck. Denies other symptoms. Denies injury. Pt states PCP referred her to a surgeon for a nodule on right side of neck.

## 2013-04-08 NOTE — ED Provider Notes (Signed)
CSN: [id number]     Arrival date & time 04/06/13  1191 History   First MD Initiated Contact with Patient 04/06/13 0945     Chief Complaint  Patient presents with  . Neck Pain   (Consider location/radiation/quality/duration/timing/severity/associated sxs/prior Treatment) HPI Gradual onset constant over the last 2 days of bilateral right worse than left neck sternocleidomastoid muscle region pain and tenderness with no pain to the posterior midline neck no pain to the anterior neck no headache no fever no trauma no weakness or numbness or incoordination no change in bowel or bladder function no chest pain cough shortness of breath no change in speech vision swallowing or understanding or other concerns. There's no treatment prior to arrival. Past Medical History  Diagnosis Date  . Hypertension   . Depression   . Anxiety    Past Surgical History  Procedure Laterality Date  . Cesarean section    . Breast surgery    . Tubal ligation     No family history on file. History  Substance Use Topics  . Smoking status: Current Every Day Smoker -- 0.50 packs/day    Types: Cigarettes  . Smokeless tobacco: Not on file  . Alcohol Use: Yes   OB History   Grav Para Term Preterm Abortions TAB SAB Ect Mult Living                 Review of Systems 10 Systems reviewed and are negative for acute change except as noted in the HPI. Allergies  Codeine; Darvocet; Percocet; and Tylenol  Home Medications   Current Outpatient Rx  Name  Route  Sig  Dispense  Refill  . clonazePAM (KLONOPIN) 1 MG tablet   Oral   Take 1 mg by mouth at bedtime as needed for anxiety.         Marland Kitchen FLUoxetine (PROZAC) 20 MG capsule   Oral   Take 20 mg by mouth daily.         . diazepam (VALIUM) 5 MG tablet   Oral   Take 1 tablet (5 mg total) by mouth every 6 (six) hours as needed for anxiety (spasms).   10 tablet   0   . traMADol (ULTRAM) 50 MG tablet   Oral   Take 1 tablet (50 mg total) by mouth every 6 (six)  hours as needed for pain.   15 tablet   0    BP 155/94  Pulse 70  Temp(Src) 98.5 F (36.9 C) (Oral)  Resp 16  Ht 5\' 7"  (1.702 m)  Wt 190 lb (86.183 kg)  BMI 29.75 kg/m2  SpO2 100%  LMP 04/06/2013 Physical Exam  Nursing note and vitals reviewed. Constitutional:  Awake, alert, nontoxic appearance with baseline speech for patient.  HENT:  Head: Atraumatic.  Mouth/Throat: No oropharyngeal exudate.  Eyes: EOM are normal. Pupils are equal, round, and reactive to light. Right eye exhibits no discharge. Left eye exhibits no discharge.  Neck: Neck supple. No thyromegaly present.  No posterior midline tenderness; patient has reproducible tenderness to the bilateral sternocleidomastoid muscle regions without excess warmth erythema induration fluctuance or masses palpated.  Cardiovascular: Normal rate and regular rhythm.   No murmur heard. Pulmonary/Chest: Effort normal and breath sounds normal. No stridor. No respiratory distress. She has no wheezes. She has no rales. She exhibits no tenderness.  Abdominal: Soft. Bowel sounds are normal. She exhibits no mass. There is no tenderness. There is no rebound.  Musculoskeletal: She exhibits no tenderness.  Baseline ROM, moves extremities  with no obvious new focal weakness.  Lymphadenopathy:    She has no cervical adenopathy.  Neurological: She is alert.  Awake, alert, cooperative and aware of situation; motor strength 5/5 bilaterally; sensation normal to light touch bilaterally; peripheral visual fields full to confrontation; no facial asymmetry; tongue midline; major cranial nerves appear intact; no pronator drift, normal finger to nose bilaterally, baseline gait without new ataxia.  Skin: No rash noted.  Psychiatric:  Appears anxious    ED Course  Procedures (including critical care time) Patient informed of clinical course, understand medical decision-making process, and agree with plan. Labs Review Labs Reviewed - No data to  display Imaging Review No results found.  EKG Interpretation   None       MDM   1. Muscle spasms of neck    I doubt any other EMC precluding discharge at this time including, but not necessarily limited to the following:SBI, radiculopathy, cervical myelopathy.    Hurman Horn, MD 04/08/13 2100

## 2013-06-07 HISTORY — PX: THYROIDECTOMY: SHX17

## 2013-06-21 ENCOUNTER — Emergency Department (HOSPITAL_BASED_OUTPATIENT_CLINIC_OR_DEPARTMENT_OTHER)
Admission: EM | Admit: 2013-06-21 | Discharge: 2013-06-21 | Disposition: A | Discharge status: Home / Self Care | Payer: Medicaid Other | Attending: Emergency Medicine | Admitting: Emergency Medicine

## 2013-06-21 ENCOUNTER — Encounter (HOSPITAL_BASED_OUTPATIENT_CLINIC_OR_DEPARTMENT_OTHER): Payer: Self-pay | Admitting: Emergency Medicine

## 2013-06-21 DIAGNOSIS — Y929 Unspecified place or not applicable: Secondary | ICD-10-CM | POA: Insufficient documentation

## 2013-06-21 DIAGNOSIS — F411 Generalized anxiety disorder: Secondary | ICD-10-CM | POA: Insufficient documentation

## 2013-06-21 DIAGNOSIS — I10 Essential (primary) hypertension: Secondary | ICD-10-CM | POA: Insufficient documentation

## 2013-06-21 DIAGNOSIS — M25469 Effusion, unspecified knee: Secondary | ICD-10-CM | POA: Insufficient documentation

## 2013-06-21 DIAGNOSIS — Y9389 Activity, other specified: Secondary | ICD-10-CM | POA: Insufficient documentation

## 2013-06-21 DIAGNOSIS — N72 Inflammatory disease of cervix uteri: Secondary | ICD-10-CM | POA: Insufficient documentation

## 2013-06-21 DIAGNOSIS — Z3202 Encounter for pregnancy test, result negative: Secondary | ICD-10-CM | POA: Insufficient documentation

## 2013-06-21 DIAGNOSIS — X19XXXA Contact with other heat and hot substances, initial encounter: Secondary | ICD-10-CM | POA: Insufficient documentation

## 2013-06-21 DIAGNOSIS — R6883 Chills (without fever): Secondary | ICD-10-CM | POA: Insufficient documentation

## 2013-06-21 DIAGNOSIS — F329 Major depressive disorder, single episode, unspecified: Secondary | ICD-10-CM | POA: Insufficient documentation

## 2013-06-21 DIAGNOSIS — F17200 Nicotine dependence, unspecified, uncomplicated: Secondary | ICD-10-CM | POA: Insufficient documentation

## 2013-06-21 DIAGNOSIS — T2124XA Burn of second degree of lower back, initial encounter: Secondary | ICD-10-CM | POA: Insufficient documentation

## 2013-06-21 DIAGNOSIS — F3289 Other specified depressive episodes: Secondary | ICD-10-CM | POA: Insufficient documentation

## 2013-06-21 DIAGNOSIS — M25462 Effusion, left knee: Secondary | ICD-10-CM

## 2013-06-21 LAB — URINALYSIS, ROUTINE W REFLEX MICROSCOPIC
BILIRUBIN URINE: NEGATIVE
Glucose, UA: NEGATIVE mg/dL
Hgb urine dipstick: NEGATIVE
KETONES UR: NEGATIVE mg/dL
Leukocytes, UA: NEGATIVE
NITRITE: NEGATIVE
PROTEIN: NEGATIVE mg/dL
Specific Gravity, Urine: 1.02 (ref 1.005–1.030)
UROBILINOGEN UA: 0.2 mg/dL (ref 0.0–1.0)
pH: 5.5 (ref 5.0–8.0)

## 2013-06-21 LAB — WET PREP, GENITAL
CLUE CELLS WET PREP: NONE SEEN
Trich, Wet Prep: NONE SEEN
Yeast Wet Prep HPF POC: NONE SEEN

## 2013-06-21 LAB — PREGNANCY, URINE: Preg Test, Ur: NEGATIVE

## 2013-06-21 MED ORDER — AZITHROMYCIN 250 MG PO TABS
1000.0000 mg | ORAL_TABLET | Freq: Once | ORAL | Status: AC
  Administered 2013-06-21: 1000 mg via ORAL
  Filled 2013-06-21: qty 4

## 2013-06-21 MED ORDER — AZITHROMYCIN 250 MG PO TABS
ORAL_TABLET | ORAL | Status: AC
  Filled 2013-06-21: qty 1

## 2013-06-21 NOTE — ED Provider Notes (Signed)
Medical screening examination/treatment/procedure(s) were performed by non-physician practitioner and as supervising physician I was immediately available for consultation/collaboration.  EKG Interpretation   None         [place], DO 06/21/13 1510

## 2013-06-21 NOTE — ED Provider Notes (Signed)
CSN: [id number]     Arrival date & time 06/21/13  1128 History   First MD Initiated Contact with Patient 06/21/13 1200     Chief Complaint  Patient presents with  . Vaginal Discharge   (Consider location/radiation/quality/duration/timing/severity/associated sxs/prior Treatment) Patient is a 33 y.o. female presenting with vaginal discharge. The history is provided by the patient.  Vaginal Discharge Quality:  Watery Onset quality:  Gradual Duration:  5 days Timing:  Intermittent Progression:  Worsening Chronicity:  New Context: spontaneously   Relieved by:  None tried Worsened by:  Nothing tried Ineffective treatments:  None tried Associated symptoms: urinary frequency and vaginal itching   Associated symptoms: no dysuria, no fever, no genital lesions, no nausea, no urinary hesitancy, no urinary incontinence and no vomiting   Risk factors: no gynecological surgery, no new sexual partner, no prior miscarriage and no STI exposure    Nicole Nicholson is a 33 y.o. female who presents to the ED with vaginal discharge. She has been with her current sex partner x 6 years. She is a G4 P4, no hx of STI's, normal pap one year ago. She also complains of a burn to her lower back that happened 2 weeks ago. She uses a heating pad for back pain when she has her period and went to sleep and the heating pad was to hot and burned her back. She has one wound left on the right lower back that is healing but she states that she gets a sharp pain from the burn area up her back sometimes. She also complains of left knee swelling that has been going on for months. She states that the knee hurts and then swells. It seems to be going below knee know.  Past Medical History  Diagnosis Date  . Hypertension   . Depression   . Anxiety    Past Surgical History  Procedure Laterality Date  . Cesarean section    . Breast surgery    . Tubal ligation     No family history on file. History  Substance Use Topics  .  Smoking status: Current Every Day Smoker -- 0.50 packs/day    Types: Cigarettes  . Smokeless tobacco: Not on file  . Alcohol Use: Yes       OB History   Grav Para Term Preterm Abortions TAB SAB Ect Mult Living                 Review of Systems  Constitutional: Positive for chills. Negative for fever.  HENT: Negative.   Eyes: Negative for pain and visual disturbance.  Gastrointestinal: Negative for nausea and vomiting.  Genitourinary: Positive for vaginal discharge. Negative for bladder incontinence, dysuria, hesitancy, urgency and frequency.  Musculoskeletal: Positive for back pain.  Skin: Positive for wound.  Allergic/Immunologic: Negative for immunocompromised state.  Neurological: Negative for syncope and headaches.  Psychiatric/Behavioral: Negative for confusion. The patient is not nervous/anxious.     Allergies  Codeine; Darvocet; Percocet; and Tylenol  Home Medications   Current Outpatient Rx  Name  Route  Sig  Dispense  Refill  . clonazePAM (KLONOPIN) 1 MG tablet   Oral   Take 1 mg by mouth at bedtime as needed for anxiety.         . diazepam (VALIUM) 5 MG tablet   Oral   Take 1 tablet (5 mg total) by mouth every 6 (six) hours as needed for anxiety (spasms).   10 tablet   0   . FLUoxetine (PROZAC) 20  MG capsule   Oral   Take 20 mg by mouth daily.         . traMADol (ULTRAM) 50 MG tablet   Oral   Take 1 tablet (50 mg total) by mouth every 6 (six) hours as needed for pain.   15 tablet   0    BP 132/84  Pulse 70  Temp(Src) 97.8 F (36.6 C) (Oral)  Resp 20  Wt 190 lb (86.183 kg)  SpO2 100%  LMP 06/07/2013 Physical Exam  Nursing note and vitals reviewed. Constitutional: She is oriented to person, place, and time. She appears well-developed and well-nourished.  HENT:  Head: Normocephalic and atraumatic.  Eyes: Conjunctivae and EOM are normal.  Neck: Normal range of motion. Neck supple.  Cardiovascular: Normal rate, regular rhythm and normal  heart sounds.   Pulmonary/Chest: Effort normal. No respiratory distress. She has no wheezes. She has no rales.  Abdominal: Soft. Bowel sounds are normal. There is no tenderness.  Genitourinary:  External genitalia without lesions. Watery discharge vaginal vault. Cervix with strawberry appearance. No CMT, no adnexal tenderness, uterus without palpable enlargement.    Musculoskeletal: Normal range of motion.       Left knee: She exhibits swelling. She exhibits normal range of motion, no ecchymosis, no deformity, no laceration, no erythema, normal alignment and normal patellar mobility. Tenderness found.       Legs: No calf pain on exam. Pedal pulses equal bilateral, adequate circulation.  Neurological: She is alert and oriented to person, place, and time. She has normal strength. No cranial nerve deficit or sensory deficit. Gait normal.  Skin: Skin is warm and dry. Burn noted.     Small healing burn to right lower back without signs of infection.   Psychiatric: She has a normal mood and affect. Her behavior is normal.    ED Course  Procedures (including critical care time) Labs Review Results for orders placed during the hospital encounter of 06/21/13 (from the past 24 hour(s))  URINALYSIS, ROUTINE W REFLEX MICROSCOPIC     Status: Abnormal   Collection Time    06/21/13 11:43 AM      Result Value Range   Color, Urine YELLOW  YELLOW   APPearance CLOUDY (*) CLEAR   Specific Gravity, Urine 1.020  1.005 - 1.030   pH 5.5  5.0 - 8.0   Glucose, UA NEGATIVE  NEGATIVE mg/dL   Hgb urine dipstick NEGATIVE  NEGATIVE   Bilirubin Urine NEGATIVE  NEGATIVE   Ketones, ur NEGATIVE  NEGATIVE mg/dL   Protein, ur NEGATIVE  NEGATIVE mg/dL   Urobilinogen, UA 0.2  0.0 - 1.0 mg/dL   Nitrite NEGATIVE  NEGATIVE   Leukocytes, UA NEGATIVE  NEGATIVE  PREGNANCY, URINE     Status: None   Collection Time    06/21/13 11:43 AM      Result Value Range   Preg Test, Ur NEGATIVE  NEGATIVE  WET PREP, GENITAL      Status: Abnormal   Collection Time    06/21/13 12:16 PM      Result Value Range   Yeast Wet Prep HPF POC NONE SEEN  NONE SEEN   Trich, Wet Prep NONE SEEN  NONE SEEN   Clue Cells Wet Prep HPF POC NONE SEEN  NONE SEEN   WBC, Wet Prep HPF POC FEW (*) NONE SEEN    MDM 33 y.o. female with complaint of vaginal discharge. Cervix is inflamed and I will treat for cervicitis with Zithromax 1 gram PO  here today.  Normal urine and negative pregnancy. Patient also has healing burn to right lower back without signs of infection. Chronic left knee swelling that has been ongoing for months. Discussed with the patient clinical and lab findings and plan of care. Discussed and encouraged patient to follow up with her PCP for her chronic knee pain and referral as needed. She will take ibuprofen for the knee and back pain.  All questioned fully answered.   Medication List    ASK your doctor about these medications       clonazePAM 1 MG tablet  Commonly known as:  KLONOPIN  Take 1 mg by mouth at bedtime as needed for anxiety.     diazepam 5 MG tablet  Commonly known as:  VALIUM  Take 1 tablet (5 mg total) by mouth every 6 (six) hours as needed for anxiety (spasms).     FLUoxetine 20 MG capsule  Commonly known as:  PROZAC  Take 20 mg by mouth daily.     traMADol 50 MG tablet  Commonly known as:  ULTRAM  Take 1 tablet (50 mg total) by mouth every 6 (six) hours as needed for pain.         [place], NP 06/21/13 1257

## 2013-06-21 NOTE — ED Notes (Signed)
Pelvic cart at bedside. 

## 2013-06-21 NOTE — Discharge Instructions (Signed)
Your urine shows no infection today. Your pelvic exam shows redness and inflammation of the cervix. We have treated you with antibiotics for this. You will need to use Neosporin Ointment to the burn area on your back.  Follow up with Dr. Arnoldo Morale for further evaluation of the swelling of your knee and referral as needed. Take ibuprofen as needed for discomfort in our knee and back. Return as needed.

## 2013-06-21 NOTE — ED Notes (Signed)
Vaginal discharge and dark urine. States she has lower back pain with each menses. She used a heating pad for pain 2 weeks ago and fell sleep. C.o pain from burns. Healing small dime size burns noted. Radiating sharp pain and swelling in her left knee for "a long time". Has been sweating for the past 3 nights.

## 2013-06-22 LAB — GC/CHLAMYDIA PROBE AMP
CT Probe RNA: NEGATIVE
GC Probe RNA: NEGATIVE

## 2013-07-15 ENCOUNTER — Emergency Department (HOSPITAL_BASED_OUTPATIENT_CLINIC_OR_DEPARTMENT_OTHER)
Admission: EM | Admit: 2013-07-15 | Discharge: 2013-07-15 | Disposition: A | Discharge status: Home / Self Care | Payer: Medicaid Other | Attending: Emergency Medicine | Admitting: Emergency Medicine

## 2013-07-15 ENCOUNTER — Encounter (HOSPITAL_BASED_OUTPATIENT_CLINIC_OR_DEPARTMENT_OTHER): Payer: Self-pay | Admitting: Emergency Medicine

## 2013-07-15 DIAGNOSIS — B372 Candidiasis of skin and nail: Secondary | ICD-10-CM | POA: Insufficient documentation

## 2013-07-15 DIAGNOSIS — Z79899 Other long term (current) drug therapy: Secondary | ICD-10-CM | POA: Insufficient documentation

## 2013-07-15 DIAGNOSIS — F329 Major depressive disorder, single episode, unspecified: Secondary | ICD-10-CM | POA: Insufficient documentation

## 2013-07-15 DIAGNOSIS — F411 Generalized anxiety disorder: Secondary | ICD-10-CM | POA: Insufficient documentation

## 2013-07-15 DIAGNOSIS — Z8639 Personal history of other endocrine, nutritional and metabolic disease: Secondary | ICD-10-CM | POA: Insufficient documentation

## 2013-07-15 DIAGNOSIS — I10 Essential (primary) hypertension: Secondary | ICD-10-CM | POA: Insufficient documentation

## 2013-07-15 DIAGNOSIS — IMO0002 Reserved for concepts with insufficient information to code with codable children: Secondary | ICD-10-CM | POA: Insufficient documentation

## 2013-07-15 DIAGNOSIS — Z862 Personal history of diseases of the blood and blood-forming organs and certain disorders involving the immune mechanism: Secondary | ICD-10-CM | POA: Insufficient documentation

## 2013-07-15 DIAGNOSIS — F3289 Other specified depressive episodes: Secondary | ICD-10-CM | POA: Insufficient documentation

## 2013-07-15 DIAGNOSIS — Y838 Other surgical procedures as the cause of abnormal reaction of the patient, or of later complication, without mention of misadventure at the time of the procedure: Secondary | ICD-10-CM | POA: Insufficient documentation

## 2013-07-15 DIAGNOSIS — F17200 Nicotine dependence, unspecified, uncomplicated: Secondary | ICD-10-CM | POA: Insufficient documentation

## 2013-07-15 MED ORDER — FLUCONAZOLE 100 MG PO TABS
200.0000 mg | ORAL_TABLET | Freq: Once | ORAL | Status: AC
  Administered 2013-07-15: 200 mg via ORAL
  Filled 2013-07-15: qty 2

## 2013-07-15 MED ORDER — CETIRIZINE HCL 5 MG/5ML PO SYRP
10.0000 mg | ORAL_SOLUTION | Freq: Once | ORAL | Status: AC
  Administered 2013-07-15: 10 mg via ORAL
  Filled 2013-07-15: qty 10

## 2013-07-15 MED ORDER — TERBINAFINE HCL 1 % EX CREA: TOPICAL_CREAM | CUTANEOUS | Status: DC

## 2013-07-15 MED ORDER — CETIRIZINE HCL 10 MG PO TABS: ORAL_TABLET | ORAL | Status: DC

## 2013-07-15 NOTE — ED Notes (Signed)
Had thyroidectomy on January 27th.  Went back for a post op visit on Thursday.  C/o lump under incision, c/o genital itching and burning(1 1/2 weeks).  Is not sexually active.

## 2013-07-15 NOTE — Discharge Instructions (Signed)
Cutaneous Candidiasis Cutaneous candidiasis is a condition in which there is an overgrowth of yeast (candida) on the skin. Yeast normally live on the skin, but in small enough numbers not to cause any symptoms. In certain cases, increased growth of the yeast may cause an actual yeast infection. This kind of infection usually occurs in areas of the skin that are constantly warm and moist, such as the armpits or the groin. Yeast is the most common cause of diaper rash in babies and in people who cannot control their bowel movements (incontinence). CAUSES  The fungus that most often causes cutaneous candidiasis is Candida albicans. Conditions that can increase the risk of getting a yeast infection of the skin include:  Obesity.  Pregnancy.  Diabetes.  Taking antibiotic medicine.  Taking birth control pills.  Taking steroid medicines.  Thyroid disease.  An iron or zinc deficiency.  Problems with the immune system. SYMPTOMS   Red, swollen area of the skin.  Bumps on the skin.  Itchiness. DIAGNOSIS  The diagnosis of cutaneous candidiasis is usually based on its appearance. Light scrapings of the skin may also be taken and viewed under a microscope to identify the presence of yeast. TREATMENT  Antifungal creams may be applied to the infected skin. In severe cases, oral medicines may be needed.  HOME CARE INSTRUCTIONS   Keep your skin clean and dry.  Maintain a healthy weight.  If you have diabetes, keep your blood sugar under control. SEEK IMMEDIATE MEDICAL CARE IF:  Your rash continues to spread despite treatment.  You have a fever, chills, or abdominal pain. Document Released: 02/09/2011 Document Revised: 08/16/2011 Document Reviewed: 02/09/2011 [employer] Patient Information 2014 [employer].  

## 2013-07-15 NOTE — ED Provider Notes (Signed)
CSN: [id number]     Arrival date & time 07/15/13  0048 History   First MD Initiated Contact with Patient 07/15/13 0153     Chief Complaint  Patient presents with  . Post-op Problem   (Consider location/radiation/quality/duration/timing/severity/associated sxs/prior Treatment) HPI This is a 33 year old 2-year-old female who had a thyroidectomy on January 27 for multinodular goiter. She had her Steri-Strips removed 4 days ago. Yesterday she noted a small tender knot on the right lateral aspect of the wound. It is not erythematous. It is not hot. It has not been draining. She has not had a fever. She is also having an intertriginous rash in her groin folds as well as having generalized itching. She attributes this to an antibiotic she was prescribed. She states that she stopped taking the antibiotics because of the itching. When asked when she stopped taking the antibiotic she stated she could not remember. She does not know the name of the antibiotic. She states the itching began when she was still in the hospital postoperatively. She states the pain associated with the rash is severe. She has tried multiple over-the-counter lotions and creams without relief.  Past Medical History  Diagnosis Date  . Hypertension   . Depression   . Anxiety    Past Surgical History  Procedure Laterality Date  . Cesarean section    . Breast surgery    . Tubal ligation    . Thyroidectomy     No family history on file. History  Substance Use Topics  . Smoking status: Current Every Day Smoker -- 0.50 packs/day    Types: Cigarettes  . Smokeless tobacco: Not on file  . Alcohol Use: Yes   OB History   Grav Para Term Preterm Abortions TAB SAB Ect Mult Living                 Review of Systems  All other systems reviewed and are negative.    Allergies  Codeine; Darvocet; Percocet; and Tylenol  Home Medications   Current Outpatient Rx  Name  Route  Sig  Dispense  Refill  . ketorolac (TORADOL) 10 MG  tablet   Oral   Take 10 mg by mouth every 6 (six) hours as needed.         Marland Kitchen levothyroxine (SYNTHROID, LEVOTHROID) 112 MCG tablet   Oral   Take 112 mcg by mouth daily before breakfast.         . clonazePAM (KLONOPIN) 1 MG tablet   Oral   Take 1 mg by mouth at bedtime as needed for anxiety.         . diazepam (VALIUM) 5 MG tablet   Oral   Take 1 tablet (5 mg total) by mouth every 6 (six) hours as needed for anxiety (spasms).   10 tablet   0   . FLUoxetine (PROZAC) 20 MG capsule   Oral   Take 20 mg by mouth daily.         . traMADol (ULTRAM) 50 MG tablet   Oral   Take 1 tablet (50 mg total) by mouth every 6 (six) hours as needed for pain.   15 tablet   0    BP 134/81  Pulse 65  Temp(Src) 97.9 F (36.6 C) (Oral)  Resp 20  Ht 5\' 7"  (1.702 m)  Wt 215 lb (97.523 kg)  BMI 33.67 kg/m2  SpO2 99%  LMP 07/06/2013  Physical Exam General: Well-developed, well-nourished female in no acute distress; appearance consistent with age  of record HENT: normocephalic; atraumatic; well-healing surgical incision of the anterior neck. Small, tender nodule about 0.5 cm in diameter on the right lateral aspect of the wound without erythema, warmth, drainage, surrounding edema or pointing. Eyes: pupils equal, round and reactive to light; extraocular muscles intact Neck: supple Heart: regular rate and rhythm Lungs: clear to auscultation bilaterally Abdomen: soft; nondistended; nontender; no masses or hepatosplenomegaly; bowel sounds present Extremities: No deformity; full range of motion; pulses normal Neurologic: Awake, alert and oriented; motor function intact in all extremities and symmetric; no facial droop Skin: Warm and dry; intertriginous rash of the groin folds and vulva Psychiatric: Flat affect    ED Course  Procedures (including critical care time)   MDM  The nodule and the patient's wound does not look infected at this time. She has a followup appointment with her  surgeon in 4 days. She was advised that if it turns red, enlarged as are begins draining purulent material she should have it reevaluated. Her rash is consistent with tinea.    Wynetta Fines, MD 07/15/13 [phone]

## 2014-11-13 ENCOUNTER — Other Ambulatory Visit: Payer: Self-pay | Admitting: Hematology and Oncology

## 2014-11-13 ENCOUNTER — Other Ambulatory Visit: Payer: Self-pay

## 2014-11-13 ENCOUNTER — Encounter: Payer: Self-pay | Admitting: Hematology and Oncology

## 2014-11-13 ENCOUNTER — Emergency Department (HOSPITAL_BASED_OUTPATIENT_CLINIC_OR_DEPARTMENT_OTHER)
Admission: EM | Admit: 2014-11-13 | Discharge: 2014-11-13 | Disposition: A | Discharge status: Home / Self Care | Payer: Medicaid Other | Attending: Emergency Medicine | Admitting: Emergency Medicine

## 2014-11-13 ENCOUNTER — Encounter (HOSPITAL_BASED_OUTPATIENT_CLINIC_OR_DEPARTMENT_OTHER): Payer: Self-pay | Admitting: Emergency Medicine

## 2014-11-13 ENCOUNTER — Emergency Department (HOSPITAL_BASED_OUTPATIENT_CLINIC_OR_DEPARTMENT_OTHER): Payer: Medicaid Other

## 2014-11-13 DIAGNOSIS — Z720 Tobacco use: Secondary | ICD-10-CM | POA: Diagnosis not present

## 2014-11-13 DIAGNOSIS — I10 Essential (primary) hypertension: Secondary | ICD-10-CM | POA: Insufficient documentation

## 2014-11-13 DIAGNOSIS — Z79899 Other long term (current) drug therapy: Secondary | ICD-10-CM | POA: Insufficient documentation

## 2014-11-13 DIAGNOSIS — Z86018 Personal history of other benign neoplasm: Secondary | ICD-10-CM | POA: Insufficient documentation

## 2014-11-13 DIAGNOSIS — J40 Bronchitis, not specified as acute or chronic: Secondary | ICD-10-CM

## 2014-11-13 DIAGNOSIS — D509 Iron deficiency anemia, unspecified: Secondary | ICD-10-CM | POA: Diagnosis not present

## 2014-11-13 DIAGNOSIS — F329 Major depressive disorder, single episode, unspecified: Secondary | ICD-10-CM | POA: Insufficient documentation

## 2014-11-13 DIAGNOSIS — Z3202 Encounter for pregnancy test, result negative: Secondary | ICD-10-CM | POA: Diagnosis not present

## 2014-11-13 DIAGNOSIS — E860 Dehydration: Secondary | ICD-10-CM | POA: Insufficient documentation

## 2014-11-13 DIAGNOSIS — R51 Headache: Secondary | ICD-10-CM | POA: Diagnosis present

## 2014-11-13 DIAGNOSIS — J209 Acute bronchitis, unspecified: Secondary | ICD-10-CM | POA: Insufficient documentation

## 2014-11-13 DIAGNOSIS — F419 Anxiety disorder, unspecified: Secondary | ICD-10-CM | POA: Diagnosis not present

## 2014-11-13 HISTORY — DX: Iron deficiency anemia, unspecified: D50.9

## 2014-11-13 LAB — CBC WITH DIFFERENTIAL/PLATELET
BASOS ABS: 0 10*3/uL (ref 0.0–0.1)
Basophils Relative: 1 % (ref 0–1)
EOS ABS: 0.1 10*3/uL (ref 0.0–0.7)
EOS PCT: 1 % (ref 0–5)
HCT: 25.3 % — ABNORMAL LOW (ref 36.0–46.0)
Hemoglobin: 7.4 g/dL — ABNORMAL LOW (ref 12.0–15.0)
LYMPHS ABS: 1.3 10*3/uL (ref 0.7–4.0)
Lymphocytes Relative: 25 % (ref 12–46)
MCH: 21.2 pg — ABNORMAL LOW (ref 26.0–34.0)
MCHC: 29.2 g/dL — AB (ref 30.0–36.0)
MCV: 72.5 fL — AB (ref 78.0–100.0)
MONO ABS: 0.7 10*3/uL (ref 0.1–1.0)
MONOS PCT: 13 % — AB (ref 3–12)
NEUTROS ABS: 3.2 10*3/uL (ref 1.7–7.7)
NEUTROS PCT: 61 % (ref 43–77)
Platelets: 287 10*3/uL (ref 150–400)
RBC: 3.49 MIL/uL — ABNORMAL LOW (ref 3.87–5.11)
RDW: 16.7 % — ABNORMAL HIGH (ref 11.5–15.5)
WBC: 5.3 10*3/uL (ref 4.0–10.5)

## 2014-11-13 LAB — URINE MICROSCOPIC-ADD ON

## 2014-11-13 LAB — URINALYSIS, ROUTINE W REFLEX MICROSCOPIC
BILIRUBIN URINE: NEGATIVE
GLUCOSE, UA: NEGATIVE mg/dL
KETONES UR: 15 mg/dL — AB
Leukocytes, UA: NEGATIVE
NITRITE: NEGATIVE
PH: 6 (ref 5.0–8.0)
PROTEIN: NEGATIVE mg/dL
Specific Gravity, Urine: 1.035 — ABNORMAL HIGH (ref 1.005–1.030)
Urobilinogen, UA: 0.2 mg/dL (ref 0.0–1.0)

## 2014-11-13 LAB — PREGNANCY, URINE: PREG TEST UR: NEGATIVE

## 2014-11-13 LAB — COMPREHENSIVE METABOLIC PANEL
ALBUMIN: 3.5 g/dL (ref 3.5–5.0)
ALT: 12 U/L — ABNORMAL LOW (ref 14–54)
ANION GAP: 5 (ref 5–15)
AST: 21 U/L (ref 15–41)
Alkaline Phosphatase: 54 U/L (ref 38–126)
BUN: 8 mg/dL (ref 6–20)
CO2: 25 mmol/L (ref 22–32)
CREATININE: 1.32 mg/dL — AB (ref 0.44–1.00)
Calcium: 8.3 mg/dL — ABNORMAL LOW (ref 8.9–10.3)
Chloride: 104 mmol/L (ref 101–111)
GFR, EST AFRICAN AMERICAN: 60 mL/min — AB (ref >60)
GFR, EST NON AFRICAN AMERICAN: 52 mL/min — AB (ref >60)
Glucose, Bld: 91 mg/dL (ref 65–99)
POTASSIUM: 3.7 mmol/L (ref 3.5–5.1)
Sodium: 134 mmol/L — ABNORMAL LOW (ref 135–145)
Total Bilirubin: 0.1 mg/dL — ABNORMAL LOW (ref 0.3–1.2)
Total Protein: 7.6 g/dL (ref 6.5–8.1)

## 2014-11-13 LAB — CBG MONITORING, ED: Glucose-Capillary: 83 mg/dL (ref 65–99)

## 2014-11-13 LAB — TROPONIN I: Troponin I: <0.03 ng/mL (ref <0.031)

## 2014-11-13 MED ORDER — FLUCONAZOLE 50 MG PO TABS
150.0000 mg | ORAL_TABLET | Freq: Once | ORAL | Status: AC
  Administered 2014-11-13: 150 mg via ORAL
  Filled 2014-11-13 (×2): qty 1

## 2014-11-13 MED ORDER — IBUPROFEN 800 MG PO TABS: 800.0000 mg | ORAL_TABLET | Freq: Three times a day (TID) | ORAL | Status: DC

## 2014-11-13 MED ORDER — MORPHINE SULFATE 4 MG/ML IJ SOLN
4.0000 mg | Freq: Once | INTRAMUSCULAR | Status: AC
  Administered 2014-11-13: 4 mg via INTRAVENOUS
  Filled 2014-11-13: qty 1

## 2014-11-13 MED ORDER — SODIUM CHLORIDE 0.9 % IV BOLUS (SEPSIS)
1000.0000 mL | Freq: Once | INTRAVENOUS | Status: AC
  Administered 2014-11-13: 1000 mL via INTRAVENOUS

## 2014-11-13 MED ORDER — LEVOFLOXACIN IN D5W 750 MG/150ML IV SOLN
750.0000 mg | Freq: Once | INTRAVENOUS | Status: AC
  Administered 2014-11-13: 750 mg via INTRAVENOUS
  Filled 2014-11-13: qty 150

## 2014-11-13 MED ORDER — ONDANSETRON HCL 4 MG/2ML IJ SOLN
4.0000 mg | Freq: Once | INTRAMUSCULAR | Status: AC
  Administered 2014-11-13: 4 mg via INTRAVENOUS
  Filled 2014-11-13: qty 2

## 2014-11-13 MED ORDER — DIPHENHYDRAMINE HCL 50 MG/ML IJ SOLN
12.5000 mg | Freq: Once | INTRAMUSCULAR | Status: AC
  Administered 2014-11-13: 12.5 mg via INTRAVENOUS
  Filled 2014-11-13: qty 1

## 2014-11-13 MED ORDER — KETOROLAC TROMETHAMINE 30 MG/ML IJ SOLN
30.0000 mg | Freq: Once | INTRAMUSCULAR | Status: AC
  Administered 2014-11-13: 30 mg via INTRAVENOUS
  Filled 2014-11-13: qty 1

## 2014-11-13 MED ORDER — METOCLOPRAMIDE HCL 5 MG/ML IJ SOLN
10.0000 mg | Freq: Once | INTRAMUSCULAR | Status: AC
  Administered 2014-11-13: 10 mg via INTRAVENOUS
  Filled 2014-11-13: qty 2

## 2014-11-13 MED ORDER — LEVOFLOXACIN 750 MG PO TABS: 750.0000 mg | ORAL_TABLET | Freq: Every day | ORAL | Status: DC

## 2014-11-13 NOTE — ED Provider Notes (Signed)
CSN: [id number]     Arrival date & time 11/13/14  0532 History   First MD Initiated Contact with Patient 11/13/14 [phone]     Chief Complaint  Patient presents with  . Headache     (Consider location/radiation/quality/duration/timing/severity/associated sxs/prior Treatment) The history is provided by the patient.  Taje Dagostino is a 34 y.o. female history of depression, hypertension, pituitary adenoma, goiter status post resection here presenting with cough, fever, headache. Nonproductive cough for the last 2-3 days. States that when she coughs she has worsening headaches. Denies any neck pain or neck stiffness. Feel lightheaded and dizzy. Also has some diffuse myalgias. Her son is also sick with similar symptoms. Denies any urinary symptoms and patient is currently on her period.    Past Medical History  Diagnosis Date  . Hypertension   . Depression   . Anxiety   . Iron deficiency anemia 11/13/2014   Past Surgical History  Procedure Laterality Date  . Cesarean section    . Breast surgery    . Tubal ligation    . Thyroidectomy     History reviewed. No pertinent family history. History  Substance Use Topics  . Smoking status: Current Every Day Smoker -- 0.50 packs/day    Types: Cigarettes  . Smokeless tobacco: Not on file  . Alcohol Use: Yes   OB History    No data available     Review of Systems  Respiratory: Positive for cough.   Neurological: Positive for headaches.  All other systems reviewed and are negative.     Allergies  Codeine; Darvocet; Percocet; and Tylenol  Home Medications   Prior to Admission medications   Medication Sig Start Date End Date Taking? Authorizing Provider  cetirizine (ZYRTEC) 10 MG tablet Take 1 tablet at night as needed for itching. 07/15/13   John Molpus, MD  clonazePAM (KLONOPIN) 1 MG tablet Take 1 mg by mouth at bedtime as needed for anxiety.    Historical Provider, MD  diazepam (VALIUM) 5 MG tablet Take 1 tablet (5 mg total) by mouth  every 6 (six) hours as needed for anxiety (spasms). 04/06/13   Riki Altes, MD  FLUoxetine (PROZAC) 20 MG capsule Take 20 mg by mouth daily.    Historical Provider, MD  ketorolac (TORADOL) 10 MG tablet Take 10 mg by mouth every 6 (six) hours as needed.    Historical Provider, MD  levothyroxine (SYNTHROID, LEVOTHROID) 112 MCG tablet Take 112 mcg by mouth daily before breakfast.    Historical Provider, MD  terbinafine (LAMISIL) 1 % cream Apply to rash twice daily. 07/15/13   John Molpus, MD  traMADol (ULTRAM) 50 MG tablet Take 1 tablet (50 mg total) by mouth every 6 (six) hours as needed for pain. 04/06/13   Riki Altes, MD   BP 103/50 mmHg  Pulse 87  Temp(Src) 101.3 F (38.5 C) (Oral)  Resp 16  Ht 5\' 7"  (1.702 m)  Wt 235 lb (106.595 kg)  BMI 36.80 kg/m2  SpO2 99%  LMP 11/13/2014 Physical Exam  Constitutional: She is oriented to person, place, and time.  Tired, dehydrated   HENT:  Head: Normocephalic.  MM slightly dry. Posterior pharynx not red, tonsils not enlarged   Eyes: Conjunctivae are normal. Pupils are equal, round, and reactive to light.  Neck: Normal range of motion. Neck supple.  No meningeal signs   Cardiovascular: Normal rate, regular rhythm and normal heart sounds.   Pulmonary/Chest: Effort normal.  Diminished throughout, no wheezing or crackles   Abdominal: Soft.  Bowel sounds are normal. She exhibits no distension. There is no tenderness. There is no rebound.  Musculoskeletal: Normal range of motion. She exhibits no edema or tenderness.  Neurological: She is alert and oriented to person, place, and time. No cranial nerve deficit.  Skin: Skin is warm and dry.  Psychiatric: She has a normal mood and affect. Her behavior is normal. Judgment and thought content normal.  Nursing note and vitals reviewed.   ED Course  Procedures (including critical care time) Labs Review Labs Reviewed  URINALYSIS, ROUTINE W REFLEX MICROSCOPIC (NOT AT [facility]) - Abnormal; Notable for the  following:    APPearance CLOUDY (*)    Specific Gravity, Urine 1.035 (*)    Hgb urine dipstick LARGE (*)    Ketones, ur 15 (*)    All other components within normal limits  URINE MICROSCOPIC-ADD ON - Abnormal; Notable for the following:    Squamous Epithelial / LPF FEW (*)    Bacteria, UA MANY (*)    All other components within normal limits  CBC WITH DIFFERENTIAL/PLATELET - Abnormal; Notable for the following:    RBC 3.49 (*)    Hemoglobin 7.4 (*)    HCT 25.3 (*)    MCV 72.5 (*)    MCH 21.2 (*)    MCHC 29.2 (*)    RDW 16.7 (*)    Monocytes Relative 13 (*)    All other components within normal limits  COMPREHENSIVE METABOLIC PANEL - Abnormal; Notable for the following:    Sodium 134 (*)    Creatinine, Ser 1.32 (*)    Calcium 8.3 (*)    ALT 12 (*)    Total Bilirubin 0.1 (*)    GFR calc non Af Amer 52 (*)    GFR calc Af Amer 60 (*)    All other components within normal limits  PREGNANCY, URINE  TROPONIN I  CBG MONITORING, ED    Imaging Review Dg Chest 2 View  11/13/2014   CLINICAL DATA:  Cough and headache, 2 days duration.  EXAM: CHEST  2 VIEW  COMPARISON:  None.  FINDINGS: The heart size and mediastinal contours are within normal limits. Both lungs are clear. The visualized skeletal structures are unremarkable.  IMPRESSION: No active cardiopulmonary disease.   Electronically Signed   By: Andreas Newport M.D.   On: 11/13/2014 06:24   Ct Head Wo Contrast  11/13/2014   CLINICAL DATA:  Headache, dizziness, blurred vision and fever.  EXAM: CT HEAD WITHOUT CONTRAST  TECHNIQUE: Contiguous axial images were obtained from the base of the skull through the vertex without intravenous contrast.  COMPARISON:  A prior MRI of the brain on 11/15/2013 cannot be retrieved. The report is reviewed.  FINDINGS: The brain demonstrates no evidence of hemorrhage, infarction, edema, mass effect, extra-axial fluid collection, hydrocephalus or mass lesion. Findings similar to that described on the MRI  report of an enlarged sella demonstrating low attenuation. Findings are suggestive of some type of cystic abnormality. The skull is unremarkable.  IMPRESSION: No acute findings. Similar to findings reported on a prior MRI, the sella is enlarged and contains a cystic abnormality. Correlation suggested with any prior workup as a dedicated pre and post contrast MRI of the pituitary region was recommended previously.   Electronically Signed   By: Aletta Edouard M.D.   On: 11/13/2014 08:00     EKG Interpretation   Date/Time:  Wednesday November 13 2014 07:20:46 EDT Ventricular Rate:  88 PR Interval:  152 QRS Duration: 90  QT Interval:  322 QTC Calculation: 389 R Axis:   39 Text Interpretation:  Unusual P axis, possible ectopic atrial rhythm Low  voltage QRS Nonspecific T wave abnormality Abnormal ECG No previous ECGs  available Confirmed by Tanelle Lanzo  MD, Remon Quinto ([id number]) on 11/13/2014 7:22:36 AM      MDM   Final diagnoses:  None   Tayloranne Lorman is a 34 y.o. female here with cough, myalgias, headaches. Likely viral bronchitis vs pneumonia vs UTI. No neck stiffness or concern for meningitis. Given hx of pituitary adenoma, will get CT head. Will check labs, CXR, UA.   10:00 AM Fever improved to 101 from 102. Not orthostatic. CT head showed pituitary adenoma, unchanged in size. Anemic to 7.5. Baseline Hg 10. Recently finished her period. Has stomacytes on smear. Consulted Dr. Alvy Bimler, who is not concerned for TTP. States that likely baseline iron deficiency anemia exacerbated by infection. Started on levaquin for bronchitis/ early pneumonia since she is a smoker. Dr. Alvy Bimler, suggest prenatal vitamins in AM and iron pills in PM and she will see patient next Wed for possible iron infusion.      Wandra Arthurs, MD 11/13/14 1003

## 2014-11-13 NOTE — ED Notes (Signed)
Dr Darl Householder aware of temp. Will await CT results before meds.

## 2014-11-13 NOTE — Discharge Instructions (Signed)
Take motrin for fever.   Take levaquin daily for a week.   Take prenatal vitamin in the morning and iron pill in the evening daily.   See Dr. Alvy Bimler next Wed at [place]. Her office will contact you.   Return to ER if you have worse headaches, fever, vomiting.

## 2014-11-13 NOTE — ED Notes (Signed)
Patient reports that she has had a Cough and Headache for the last 2 days. The patient reports that she has chest pain when she coughs. Her head is dizzy and blurry. Patient reports that she can not sleep.

## 2014-11-13 NOTE — ED Notes (Signed)
Pt instructed to take ibuprofen for pain and fever but if bleeding increases to stop taking in per Dr Darl Householder. Pt verbalized understanding.

## 2014-11-22 ENCOUNTER — Telehealth: Payer: Self-pay | Admitting: *Deleted

## 2014-11-22 ENCOUNTER — Other Ambulatory Visit: Payer: Medicaid Other

## 2014-11-22 NOTE — Telephone Encounter (Signed)
Per staff message from [place]is  I have scheduled appts. Advised Tifanny of appts. JMW

## 2014-11-26 ENCOUNTER — Telehealth: Payer: Self-pay | Admitting: Hematology and Oncology

## 2014-11-26 NOTE — Telephone Encounter (Signed)
returned call and transferred pt to HIM

## 2014-11-28 ENCOUNTER — Other Ambulatory Visit (HOSPITAL_BASED_OUTPATIENT_CLINIC_OR_DEPARTMENT_OTHER): Payer: Medicaid Other

## 2014-11-28 ENCOUNTER — Ambulatory Visit: Payer: Medicaid Other | Admitting: Hematology and Oncology

## 2014-11-28 ENCOUNTER — Ambulatory Visit: Payer: Medicaid Other

## 2014-11-28 DIAGNOSIS — D509 Iron deficiency anemia, unspecified: Secondary | ICD-10-CM

## 2014-11-28 LAB — CBC & DIFF AND RETIC
BASO%: 0.9 % (ref 0.0–2.0)
Basophils Absolute: 0.1 10*3/uL (ref 0.0–0.1)
EOS ABS: 0.2 10*3/uL (ref 0.0–0.5)
EOS%: 2.7 % (ref 0.0–7.0)
HEMATOCRIT: 29.4 % — AB (ref 34.8–46.6)
HEMOGLOBIN: 8.6 g/dL — AB (ref 11.6–15.9)
Immature Retic Fract: 16.3 % — ABNORMAL HIGH (ref 1.60–10.00)
LYMPH%: 39.5 % (ref 14.0–49.7)
MCH: 21.7 pg — ABNORMAL LOW (ref 25.1–34.0)
MCHC: 29.3 g/dL — ABNORMAL LOW (ref 31.5–36.0)
MCV: 74.1 fL — AB (ref 79.5–101.0)
MONO#: 0.4 10*3/uL (ref 0.1–0.9)
MONO%: 7.5 % (ref 0.0–14.0)
NEUT#: 2.9 10*3/uL (ref 1.5–6.5)
NEUT%: 49.4 % (ref 38.4–76.8)
PLATELETS: 355 10*3/uL (ref 145–400)
RBC: 3.97 10*6/uL (ref 3.70–5.45)
RDW: 19.8 % — AB (ref 11.2–14.5)
RETIC CT ABS: 67.49 10*3/uL (ref 33.70–90.70)
Retic %: 1.7 % (ref 0.70–2.10)
WBC: 5.9 10*3/uL (ref 3.9–10.3)
lymph#: 2.3 10*3/uL (ref 0.9–3.3)

## 2014-11-28 LAB — FERRITIN CHCC: FERRITIN: 22 ng/mL (ref 9–269)

## 2014-11-28 LAB — IRON AND TIBC CHCC
%SAT: 4 % — ABNORMAL LOW (ref 21–57)
Iron: 18 ug/dL — ABNORMAL LOW (ref 41–142)
TIBC: 429 ug/dL (ref 236–444)
UIBC: 411 ug/dL — AB (ref 120–384)

## 2014-11-28 LAB — HOLD TUBE, BLOOD BANK

## 2014-11-29 LAB — SEDIMENTATION RATE: Sed Rate: 47 mm/hr — ABNORMAL HIGH (ref 0–20)

## 2014-12-02 ENCOUNTER — Telehealth: Payer: Self-pay | Admitting: Hematology and Oncology

## 2014-12-02 NOTE — Telephone Encounter (Signed)
Per response from NG she can she patient 6/30 @ 9:45 am. FC/NP-NG/inf moved from 6/29 to 6/30. Date/time per NG due to per patient she only works part-time MWF and requested that appointment be made on Tues/Thurs. Left message for patient re new date/time. Patient stopped by on Friday to change appointments and message was sent to NG for new date/time. Patient was aware to expect a call from me re new date/time and has my direct number to confirm.

## 2014-12-02 NOTE — Telephone Encounter (Signed)
Patient called back to confirm receiving my message re new appointment date/time for 12/05/14 @ 9:15 am.

## 2014-12-04 ENCOUNTER — Ambulatory Visit: Payer: Medicaid Other

## 2014-12-04 ENCOUNTER — Ambulatory Visit: Payer: Medicaid Other | Admitting: Hematology and Oncology

## 2014-12-05 ENCOUNTER — Telehealth: Payer: Self-pay | Admitting: Hematology and Oncology

## 2014-12-05 ENCOUNTER — Ambulatory Visit (HOSPITAL_BASED_OUTPATIENT_CLINIC_OR_DEPARTMENT_OTHER): Payer: Medicaid Other | Admitting: Hematology and Oncology

## 2014-12-05 ENCOUNTER — Telehealth: Payer: Self-pay | Admitting: *Deleted

## 2014-12-05 ENCOUNTER — Ambulatory Visit (HOSPITAL_BASED_OUTPATIENT_CLINIC_OR_DEPARTMENT_OTHER): Payer: Medicaid Other

## 2014-12-05 ENCOUNTER — Encounter: Payer: Self-pay | Admitting: Hematology and Oncology

## 2014-12-05 ENCOUNTER — Ambulatory Visit: Payer: Medicaid Other

## 2014-12-05 VITALS — BP 128/79 | HR 74 | Temp 98.2°F | Resp 18 | Ht 67.0 in | Wt 240.5 lb

## 2014-12-05 VITALS — BP 105/73 | HR 71 | Temp 98.1°F | Resp 18

## 2014-12-05 DIAGNOSIS — N920 Excessive and frequent menstruation with regular cycle: Secondary | ICD-10-CM

## 2014-12-05 DIAGNOSIS — D509 Iron deficiency anemia, unspecified: Secondary | ICD-10-CM | POA: Diagnosis not present

## 2014-12-05 DIAGNOSIS — Z716 Tobacco abuse counseling: Secondary | ICD-10-CM | POA: Diagnosis not present

## 2014-12-05 DIAGNOSIS — R5383 Other fatigue: Secondary | ICD-10-CM

## 2014-12-05 MED ORDER — ONDANSETRON HCL 8 MG PO TABS
8.0000 mg | ORAL_TABLET | Freq: Three times a day (TID) | ORAL | Status: DC | PRN
Start: 2014-12-05 — End: 2016-11-04

## 2014-12-05 MED ORDER — SODIUM CHLORIDE 0.9 % IV SOLN
Freq: Once | INTRAVENOUS | Status: AC
  Administered 2014-12-05: 11:00:00 via INTRAVENOUS

## 2014-12-05 MED ORDER — ONDANSETRON HCL 8 MG PO TABS
8.0000 mg | ORAL_TABLET | Freq: Once | ORAL | Status: AC
Start: 2014-12-05 — End: 2014-12-05
  Administered 2014-12-05: 8 mg via ORAL

## 2014-12-05 MED ORDER — SODIUM CHLORIDE 0.9 % IV SOLN
510.0000 mg | Freq: Once | INTRAVENOUS | Status: AC
  Administered 2014-12-05: 510 mg via INTRAVENOUS
  Filled 2014-12-05: qty 17

## 2014-12-05 MED ORDER — ONDANSETRON HCL 8 MG PO TABS
ORAL_TABLET | ORAL | Status: AC
  Filled 2014-12-05: qty 1

## 2014-12-05 NOTE — Progress Notes (Signed)
[facility] CONSULT NOTE  Patient Care Team: Windell Hummingbird, PA-C as PCP - General (Physician Assistant)  CHIEF COMPLAINTS/PURPOSE OF CONSULTATION:  Severe iron deficiency anemia  HISTORY OF PRESENTING ILLNESS:  Nicole Nicholson 34 y.o. female is here because of recent ER visit for severe symptomatic anemia and severe fatigue  She was found to have abnormal CBC from recent ER/urgent care visit. She has "anemia all her life". She had multiple pregnancies in the past and was told she was anemic She denies recent chest pain on exertion, but does have shortness of breath on minimal exertion, pre-syncopal episodes, or palpitations. She had not noticed any recent bleeding such as epistaxis, hematuria or hematochezia The patient has occasional over the counter NSAID ingestion. She is not on antiplatelets agents.  She had no prior history or diagnosis of cancer. Her age appropriate screening programs are up-to-date. She complained of pica but eats a variety of diet. She never donated blood or received blood transfusion The patient was prescribed oral iron supplements and she takes 1 daily but tolerated that poorly. She has excessive menorrhagia monthly and was told she has fibroids/ovarian cysts. She had thyroid surgery in the past and complained of chronic fatigue  MEDICAL HISTORY:  Past Medical History  Diagnosis Date  . Hypertension   . Depression   . Anxiety   . Iron deficiency anemia 11/13/2014    SURGICAL HISTORY: Past Surgical History  Procedure Laterality Date  . Cesarean section    . Breast surgery    . Tubal ligation    . Thyroidectomy      SOCIAL HISTORY: History   Social History  . Marital Status: Single    Spouse Name: N/A  . Number of Children: N/A  . Years of Education: N/A   Occupational History  . Not on file.   Social History Main Topics  . Smoking status: Current Every Day Smoker -- 0.50 packs/day    Types: Cigarettes  . Smokeless tobacco:  Never Used  . Alcohol Use: Yes  . Drug Use: No  . Sexual Activity: Not on file   Other Topics Concern  . Not on file   Social History Narrative    FAMILY HISTORY: History reviewed. No pertinent family history.  ALLERGIES:  is allergic to codeine; darvocet; percocet; and tylenol.  MEDICATIONS:  Current Outpatient Prescriptions  Medication Sig Dispense Refill  . clonazePAM (KLONOPIN) 1 MG tablet Take 1 mg by mouth at bedtime as needed for anxiety.    Marland Kitchen FLUoxetine (PROZAC) 20 MG capsule Take 20 mg by mouth daily.    Marland Kitchen ibuprofen (ADVIL,MOTRIN) 800 MG tablet Take 1 tablet (800 mg total) by mouth 3 (three) times daily. 21 tablet 0  . levothyroxine (SYNTHROID, LEVOTHROID) 112 MCG tablet Take 112 mcg by mouth daily before breakfast.    . ondansetron (ZOFRAN) 8 MG tablet Take 1 tablet (8 mg total) by mouth every 8 (eight) hours as needed for nausea or vomiting. 60 tablet 1   No current facility-administered medications for this visit.    REVIEW OF SYSTEMS:   Constitutional: Denies fevers, chills or abnormal night sweats Eyes: Denies blurriness of vision, double vision or watery eyes Ears, nose, mouth, throat, and face: Denies mucositis or sore throat Gastrointestinal:  Denies nausea, heartburn or change in bowel habits Skin: Denies abnormal skin rashes Lymphatics: Denies new lymphadenopathy or easy bruising Neurological:Denies numbness, tingling or new weaknesses Behavioral/Psych: Mood is stable, no new changes  All other systems were reviewed with the  patient and are negative.  PHYSICAL EXAMINATION: ECOG PERFORMANCE STATUS: 1 - Symptomatic but completely ambulatory  Filed Vitals:   12/05/14 0942  BP: 128/79  Pulse: 74  Temp: 98.2 F (36.8 C)  Resp: 18   Filed Weights   12/05/14 0942  Weight: 240 lb 8 oz (109.09 kg)    GENERAL:alert, no distress and comfortable SKIN: skin color, texture, turgor are normal, no rashes or significant lesions EYES: normal, conjunctiva are  pale and non-injected, sclera clear OROPHARYNX:no exudate, no erythema and lips, buccal mucosa, and tongue normal  NECK: supple, thyroid normal size, non-tender, without nodularity LYMPH:  no palpable lymphadenopathy in the cervical, axillary or inguinal LUNGS: clear to auscultation and percussion with normal breathing effort HEART: regular rate & rhythm and no murmurs and no lower extremity edema ABDOMEN:abdomen soft, non-tender and normal bowel sounds Musculoskeletal:no cyanosis of digits and no clubbing  PSYCH: alert & oriented x 3 with fluent speech NEURO: no focal motor/sensory deficits  LABORATORY DATA:  I have reviewed the data as listed Recent Results (from the past 2160 hour(s))  Urinalysis, Routine w reflex microscopic (not at [facility])     Status: Abnormal   Collection Time: 11/13/14  6:31 AM  Result Value Ref Range   Color, Urine YELLOW YELLOW   APPearance CLOUDY (A) CLEAR   Specific Gravity, Urine 1.035 (H) 1.005 - 1.030   pH 6.0 5.0 - 8.0   Glucose, UA NEGATIVE NEGATIVE mg/dL   Hgb urine dipstick LARGE (A) NEGATIVE   Bilirubin Urine NEGATIVE NEGATIVE   Ketones, ur 15 (A) NEGATIVE mg/dL   Protein, ur NEGATIVE NEGATIVE mg/dL   Urobilinogen, UA 0.2 0.0 - 1.0 mg/dL   Nitrite NEGATIVE NEGATIVE   Leukocytes, UA NEGATIVE NEGATIVE  Pregnancy, urine     Status: None   Collection Time: 11/13/14  6:31 AM  Result Value Ref Range   Preg Test, Ur NEGATIVE NEGATIVE    Comment:        THE SENSITIVITY OF THIS METHODOLOGY IS >20 mIU/mL.   Urine microscopic-add on     Status: Abnormal   Collection Time: 11/13/14  6:31 AM  Result Value Ref Range   Squamous Epithelial / LPF FEW (A) RARE   WBC, UA 0-2 <3 WBC/hpf   RBC / HPF TOO NUMEROUS TO COUNT <3 RBC/hpf   Bacteria, UA MANY (A) RARE   Urine-Other MUCOUS PRESENT   POC CBG, ED     Status: None   Collection Time: 11/13/14  6:47 AM  Result Value Ref Range   Glucose-Capillary 83 65 - 99 mg/dL  CBC with Differential     Status:  Abnormal   Collection Time: 11/13/14  7:20 AM  Result Value Ref Range   WBC 5.3 4.0 - 10.5 K/uL   RBC 3.49 (L) 3.87 - 5.11 MIL/uL   Hemoglobin 7.4 (L) 12.0 - 15.0 g/dL   HCT 25.3 (L) 36.0 - 46.0 %   MCV 72.5 (L) 78.0 - 100.0 fL   MCH 21.2 (L) 26.0 - 34.0 pg   MCHC 29.2 (L) 30.0 - 36.0 g/dL   RDW 16.7 (H) 11.5 - 15.5 %   Platelets 287 150 - 400 K/uL   Neutrophils Relative % 61 43 - 77 %   Neutro Abs 3.2 1.7 - 7.7 K/uL   Lymphocytes Relative 25 12 - 46 %   Lymphs Abs 1.3 0.7 - 4.0 K/uL   Monocytes Relative 13 (H) 3 - 12 %   Monocytes Absolute 0.7 0.1 - 1.0 K/uL  Eosinophils Relative 1 0 - 5 %   Eosinophils Absolute 0.1 0.0 - 0.7 K/uL   Basophils Relative 1 0 - 1 %   Basophils Absolute 0.0 0.0 - 0.1 K/uL   WBC Morphology HYPERSEGMENTED NEUT    RBC Morphology STOMATOCYTES   Comprehensive metabolic panel     Status: Abnormal   Collection Time: 11/13/14  7:20 AM  Result Value Ref Range   Sodium 134 (L) 135 - 145 mmol/L   Potassium 3.7 3.5 - 5.1 mmol/L   Chloride 104 101 - 111 mmol/L   CO2 25 22 - 32 mmol/L   Glucose, Bld 91 65 - 99 mg/dL   BUN 8 6 - 20 mg/dL   Creatinine, Ser 1.32 (H) 0.44 - 1.00 mg/dL   Calcium 8.3 (L) 8.9 - 10.3 mg/dL   Total Protein 7.6 6.5 - 8.1 g/dL   Albumin 3.5 3.5 - 5.0 g/dL   AST 21 15 - 41 U/L   ALT 12 (L) 14 - 54 U/L   Alkaline Phosphatase 54 38 - 126 U/L   Total Bilirubin 0.1 (L) 0.3 - 1.2 mg/dL   GFR calc non Af Amer 52 (L) >60 mL/min   GFR calc Af Amer 60 (L) >60 mL/min    Comment: (NOTE) The eGFR has been calculated using the CKD EPI equation. This calculation has not been validated in all clinical situations. eGFR's persistently <60 mL/min signify possible Chronic Kidney Disease.    Anion gap 5 5 - 15  Troponin I     Status: None   Collection Time: 11/13/14  7:20 AM  Result Value Ref Range   Troponin I <0.03 <0.031 ng/mL    Comment:        NO INDICATION OF MYOCARDIAL INJURY.   CBC & Diff and Retic     Status: Abnormal   Collection  Time: 11/28/14 11:19 AM  Result Value Ref Range   WBC 5.9 3.9 - 10.3 10e3/uL   NEUT# 2.9 1.5 - 6.5 10e3/uL   HGB 8.6 (L) 11.6 - 15.9 g/dL   HCT 29.4 (L) 34.8 - 46.6 %   Platelets 355 145 - 400 10e3/uL   MCV 74.1 (L) 79.5 - 101.0 fL   MCH 21.7 (L) 25.1 - 34.0 pg   MCHC 29.3 (L) 31.5 - 36.0 g/dL   RBC 3.97 3.70 - 5.45 10e6/uL   RDW 19.8 (H) 11.2 - 14.5 %   lymph# 2.3 0.9 - 3.3 10e3/uL   MONO# 0.4 0.1 - 0.9 10e3/uL   Eosinophils Absolute 0.2 0.0 - 0.5 10e3/uL   Basophils Absolute 0.1 0.0 - 0.1 10e3/uL   NEUT% 49.4 38.4 - 76.8 %   LYMPH% 39.5 14.0 - 49.7 %   MONO% 7.5 0.0 - 14.0 %   EOS% 2.7 0.0 - 7.0 %   BASO% 0.9 0.0 - 2.0 %   Retic % 1.70 0.70 - 2.10 %   Retic Ct Abs 67.49 33.70 - 90.70 10e3/uL   Immature Retic Fract 16.30 (H) 1.60 - 10.00 %  Hold Tube, Blood Bank     Status: None   Collection Time: 11/28/14 11:19 AM  Result Value Ref Range   Hold Tube, Blood Bank Blood Bank Order Cancelled   Ferritin     Status: None   Collection Time: 11/28/14 11:19 AM  Result Value Ref Range   Ferritin 22 9 - 269 ng/ml  Iron and TIBC     Status: Abnormal   Collection Time: 11/28/14 11:19 AM  Result Value Ref  Range   Iron 18 (L) 41 - 142 ug/dL   TIBC 429 236 - 444 ug/dL   UIBC 411 (H) 120 - 384 ug/dL   %SAT 4 (L) 21 - 57 %  Sedimentation rate     Status: Abnormal   Collection Time: 11/28/14 11:19 AM  Result Value Ref Range   Sed Rate 47 (H) 0 - 20 mm/hr    RADIOGRAPHIC STUDIES: I have personally reviewed the radiological images as listed and agreed with the findings in the report. Dg Chest 2 View  11/13/2014   CLINICAL DATA:  Cough and headache, 2 days duration.  EXAM: CHEST  2 VIEW  COMPARISON:  None.  FINDINGS: The heart size and mediastinal contours are within normal limits. Both lungs are clear. The visualized skeletal structures are unremarkable.  IMPRESSION: No active cardiopulmonary disease.   Electronically Signed   By: Andreas Newport M.D.   On: 11/13/2014 06:24   Ct Head  Wo Contrast  11/13/2014   CLINICAL DATA:  Headache, dizziness, blurred vision and fever.  EXAM: CT HEAD WITHOUT CONTRAST  TECHNIQUE: Contiguous axial images were obtained from the base of the skull through the vertex without intravenous contrast.  COMPARISON:  A prior MRI of the brain on 11/15/2013 cannot be retrieved. The report is reviewed.  FINDINGS: The brain demonstrates no evidence of hemorrhage, infarction, edema, mass effect, extra-axial fluid collection, hydrocephalus or mass lesion. Findings similar to that described on the MRI report of an enlarged sella demonstrating low attenuation. Findings are suggestive of some type of cystic abnormality. The skull is unremarkable.  IMPRESSION: No acute findings. Similar to findings reported on a prior MRI, the sella is enlarged and contains a cystic abnormality. Correlation suggested with any prior workup as a dedicated pre and post contrast MRI of the pituitary region was recommended previously.   Electronically Signed   By: Aletta Edouard M.D.   On: 11/13/2014 08:00    ASSESSMENT & PLAN:  Iron deficiency anemia The most likely cause of her anemia is due to chronic blood loss/malabsorption syndrome. We discussed some of the risks, benefits, and alternatives of intravenous iron infusions. The patient is symptomatic from anemia and the iron level is critically low. She tolerated oral iron supplement poorly and desires to achieved higher levels of iron faster for adequate hematopoesis. Some of the side-effects to be expected including risks of infusion reactions, phlebitis, headaches, nausea and fatigue.  The patient is willing to proceed. Patient education material was dispensed.  Goal is to keep ferritin level greater than 100 I recommend she continues to try taking iron supplement at night and prenatal vitamin in the morning I plan to recheck her levels after IV iron and see her back in a month  Menorrhagia with regular cycle She declined hysterectomy  or hormonal therapy I recommend she returns to gynaecology for other options such as possible uterine ablation  Other fatigue She has prior thyroid surgery I recommend close monitoring of thyroid function by PCP    All questions were answered. The patient knows to call the clinic with any problems, questions or concerns. I spent 40 minutes counseling the patient face to face. The total time spent in the appointment was 60 minutes and more than 50% was on counseling.     [facility], [place], MD 12/05/2014 10:51 PM

## 2014-12-05 NOTE — Patient Instructions (Signed)

## 2014-12-05 NOTE — Telephone Encounter (Signed)
pls do, 60 tabs, 1 refill

## 2014-12-05 NOTE — Assessment & Plan Note (Signed)
She has prior thyroid surgery I recommend close monitoring of thyroid function by PCP

## 2014-12-05 NOTE — Assessment & Plan Note (Signed)
She declined hysterectomy or hormonal therapy I recommend she returns to gynaecology for other options such as possible uterine ablation

## 2014-12-05 NOTE — Telephone Encounter (Signed)
LVM informing pt of Rx sent to her pharmacy.

## 2014-12-05 NOTE — Telephone Encounter (Signed)
per pof to sch pt appt-gave pt copy of avs-MW sch fera °

## 2014-12-05 NOTE — Telephone Encounter (Signed)
Pt says the zofran she got in infusion today worked well for her nausea.  Better than the compazine she usually takes at home.  She asks if Dr. Alvy Bimler will send a rx for zofran to her pharmacy,  [facility] in [place]?

## 2014-12-05 NOTE — Assessment & Plan Note (Signed)
The most likely cause of her anemia is due to chronic blood loss/malabsorption syndrome. We discussed some of the risks, benefits, and alternatives of intravenous iron infusions. The patient is symptomatic from anemia and the iron level is critically low. She tolerated oral iron supplement poorly and desires to achieved higher levels of iron faster for adequate hematopoesis. Some of the side-effects to be expected including risks of infusion reactions, phlebitis, headaches, nausea and fatigue.  The patient is willing to proceed. Patient education material was dispensed.  Goal is to keep ferritin level greater than 100 I recommend she continues to try taking iron supplement at night and prenatal vitamin in the morning I plan to recheck her levels after IV iron and see her back in a month

## 2014-12-17 ENCOUNTER — Ambulatory Visit (HOSPITAL_BASED_OUTPATIENT_CLINIC_OR_DEPARTMENT_OTHER): Payer: Medicaid Other

## 2014-12-17 VITALS — BP 120/76 | HR 74 | Temp 98.3°F | Resp 20

## 2014-12-17 DIAGNOSIS — D509 Iron deficiency anemia, unspecified: Secondary | ICD-10-CM

## 2014-12-17 DIAGNOSIS — K909 Intestinal malabsorption, unspecified: Secondary | ICD-10-CM | POA: Diagnosis not present

## 2014-12-17 MED ORDER — SODIUM CHLORIDE 0.9 % IV SOLN
Freq: Once | INTRAVENOUS | Status: AC
  Administered 2014-12-17: 09:00:00 via INTRAVENOUS

## 2014-12-17 MED ORDER — SODIUM CHLORIDE 0.9 % IV SOLN
510.0000 mg | Freq: Once | INTRAVENOUS | Status: AC
  Administered 2014-12-17: 510 mg via INTRAVENOUS
  Filled 2014-12-17: qty 17

## 2014-12-17 NOTE — Patient Instructions (Signed)

## 2015-02-06 ENCOUNTER — Other Ambulatory Visit (HOSPITAL_BASED_OUTPATIENT_CLINIC_OR_DEPARTMENT_OTHER): Payer: Medicaid Other

## 2015-02-06 DIAGNOSIS — D509 Iron deficiency anemia, unspecified: Secondary | ICD-10-CM

## 2015-02-06 LAB — CBC & DIFF AND RETIC
BASO%: 0.5 % (ref 0.0–2.0)
BASOS ABS: 0 10*3/uL (ref 0.0–0.1)
EOS%: 2 % (ref 0.0–7.0)
Eosinophils Absolute: 0.1 10*3/uL (ref 0.0–0.5)
HEMATOCRIT: 37 % (ref 34.8–46.6)
HGB: 12.2 g/dL (ref 11.6–15.9)
Immature Retic Fract: 7.1 % (ref 1.60–10.00)
LYMPH%: 35.2 % (ref 14.0–49.7)
MCH: 29.2 pg (ref 25.1–34.0)
MCHC: 33 g/dL (ref 31.5–36.0)
MCV: 88.5 fL (ref 79.5–101.0)
MONO#: 0.5 10*3/uL (ref 0.1–0.9)
MONO%: 9.9 % (ref 0.0–14.0)
NEUT#: 2.9 10*3/uL (ref 1.5–6.5)
NEUT%: 52.4 % (ref 38.4–76.8)
Platelets: 266 10*3/uL (ref 145–400)
RBC: 4.18 10*6/uL (ref 3.70–5.45)
RDW: 18 % — ABNORMAL HIGH (ref 11.2–14.5)
RETIC %: 0.98 % (ref 0.70–2.10)
Retic Ct Abs: 40.96 10*3/uL (ref 33.70–90.70)
WBC: 5.5 10*3/uL (ref 3.9–10.3)
lymph#: 1.9 10*3/uL (ref 0.9–3.3)
nRBC: 0 % (ref 0–0)

## 2015-02-06 LAB — FERRITIN CHCC: Ferritin: 38 ng/ml (ref 9–269)

## 2015-02-11 ENCOUNTER — Encounter: Payer: Self-pay | Admitting: Hematology and Oncology

## 2015-02-11 ENCOUNTER — Ambulatory Visit (HOSPITAL_BASED_OUTPATIENT_CLINIC_OR_DEPARTMENT_OTHER): Payer: Medicaid Other

## 2015-02-11 ENCOUNTER — Telehealth: Payer: Self-pay | Admitting: *Deleted

## 2015-02-11 ENCOUNTER — Telehealth: Payer: Self-pay | Admitting: Hematology and Oncology

## 2015-02-11 ENCOUNTER — Ambulatory Visit (HOSPITAL_BASED_OUTPATIENT_CLINIC_OR_DEPARTMENT_OTHER): Payer: Medicaid Other | Admitting: Hematology and Oncology

## 2015-02-11 VITALS — BP 127/78 | HR 69 | Temp 98.3°F | Resp 18 | Ht 67.0 in | Wt 238.5 lb

## 2015-02-11 VITALS — BP 136/87 | HR 67 | Temp 98.1°F | Resp 18

## 2015-02-11 DIAGNOSIS — N920 Excessive and frequent menstruation with regular cycle: Secondary | ICD-10-CM | POA: Diagnosis not present

## 2015-02-11 DIAGNOSIS — D509 Iron deficiency anemia, unspecified: Secondary | ICD-10-CM

## 2015-02-11 DIAGNOSIS — R5383 Other fatigue: Secondary | ICD-10-CM

## 2015-02-11 MED ORDER — SODIUM CHLORIDE 0.9 % IV SOLN
Freq: Once | INTRAVENOUS | Status: AC
  Administered 2015-02-11: 11:00:00 via INTRAVENOUS

## 2015-02-11 MED ORDER — SODIUM CHLORIDE 0.9 % IV SOLN
510.0000 mg | Freq: Once | INTRAVENOUS | Status: AC
  Administered 2015-02-11: 510 mg via INTRAVENOUS
  Filled 2015-02-11: qty 17

## 2015-02-11 NOTE — Telephone Encounter (Signed)
per pof to sch pt appt-sent MW email pt IV iron-adv pt will call after reply

## 2015-02-11 NOTE — Assessment & Plan Note (Signed)
The most likely cause of her anemia is due to chronic blood loss/malabsorption syndrome. We discussed some of the risks, benefits, and alternatives of intravenous iron infusions. The patient is symptomatic from anemia and the iron level is critically low. She tolerated oral iron supplement poorly and desires to achieved higher levels of iron faster for adequate hematopoesis. Some of the side-effects to be expected including risks of infusion reactions, phlebitis, headaches, nausea and fatigue.  The patient is willing to proceed. Patient education material was dispensed.  Goal is to keep ferritin level greater than 100 I recommend she continues to try taking iron supplement at night and prenatal vitamin in the morning I plan to recheck her levels after IV iron and see her back in 3 months

## 2015-02-11 NOTE — Telephone Encounter (Signed)
Per staff message and POF I have scheduled appts. Advised scheduler of appts. JMW  

## 2015-02-11 NOTE — Assessment & Plan Note (Addendum)
She has prior thyroid surgery I recommend close monitoring of thyroid function by PCP I suspect her thyroid disorder is the most likely cause of her fatigue and anemia since her anemia is now corrected after recent IV iron.

## 2015-02-11 NOTE — Assessment & Plan Note (Signed)
She declined hysterectomy or hormonal therapy I recommend she returns to gynaecology for other options

## 2015-02-11 NOTE — Patient Instructions (Signed)

## 2015-02-11 NOTE — Progress Notes (Signed)
[facility] OFFICE PROGRESS NOTE  TAYLOR, AMANDA, PA-C SUMMARY OF HEMATOLOGIC HISTORY: Nicole Nicholson 34 y.o. female is here because of recent ER visit for severe symptomatic anemia and severe fatigue  She was found to have abnormal CBC from recent ER/urgent care visit. She has "anemia all her life". She had multiple pregnancies in the past and was told she was anemic She denies recent chest pain on exertion, but does have shortness of breath on minimal exertion, pre-syncopal episodes, or palpitations. She had not noticed any recent bleeding such as epistaxis, hematuria or hematochezia The patient has occasional over the counter NSAID ingestion. She is not on antiplatelets agents.  She had no prior history or diagnosis of cancer. Her age appropriate screening programs are up-to-date. She complained of pica but eats a variety of diet. She never donated blood or received blood transfusion The patient was prescribed oral iron supplements and she takes 1 daily but tolerated that poorly. She has excessive menorrhagia monthly and was told she has fibroids/ovarian cysts. She had thyroid surgery in the past and complained of chronic fatigue She received 2 doses of IV iron in June and July 2016  INTERVAL HISTORY: Nicole Nicholson 34 y.o. female returns for further follow-up. Her energy level has improved just a little bit. She continues to have persistent menorrhagia. She is started on recent medication to treat her thyroid disorder The patient denies any recent signs or symptoms of bleeding such as spontaneous epistaxis, hematuria or hematochezia.   I have reviewed the past medical history, past surgical history, social history and family history with the patient and they are unchanged from previous note.  ALLERGIES:  is allergic to codeine; darvocet; percocet; and tylenol.  MEDICATIONS:  Current Outpatient Prescriptions  Medication Sig Dispense Refill  . BYETTA 5 MCG PEN 5  MCG/0.02ML SOPN injection ADM 5 MCG [place] BID WC  5  . clonazePAM (KLONOPIN) 1 MG tablet Take 1 mg by mouth at bedtime as needed for anxiety.    Marland Kitchen FLUoxetine (PROZAC) 20 MG capsule Take 20 mg by mouth daily.    . furosemide (LASIX) 40 MG tablet TK 1 T PO D PRN  1  . hydrOXYzine (ATARAX/VISTARIL) 25 MG tablet TK 1 T PO Q 4-6 H PRF ITCHINESS  1  . ibuprofen (ADVIL,MOTRIN) 800 MG tablet Take 1 tablet (800 mg total) by mouth 3 (three) times daily. 21 tablet 0  . NATURE-THROID 97.5 MG TABS TK 1 AND 1/2 TS PO QD  11  . ondansetron (ZOFRAN) 8 MG tablet Take 1 tablet (8 mg total) by mouth every 8 (eight) hours as needed for nausea or vomiting. 60 tablet 1   No current facility-administered medications for this visit.     REVIEW OF SYSTEMS:   Constitutional: Denies fevers, chills or night sweats Eyes: Denies blurriness of vision Ears, nose, mouth, throat, and face: Denies mucositis or sore throat Respiratory: Denies cough, dyspnea or wheezes Cardiovascular: Denies palpitation, chest discomfort or lower extremity swelling Gastrointestinal:  Denies nausea, heartburn or change in bowel habits Skin: Denies abnormal skin rashes Lymphatics: Denies new lymphadenopathy or easy bruising Neurological:Denies numbness, tingling or new weaknesses Behavioral/Psych: Mood is stable, no new changes  All other systems were reviewed with the patient and are negative.  PHYSICAL EXAMINATION: ECOG PERFORMANCE STATUS: 1 - Symptomatic but completely ambulatory  Filed Vitals:   02/11/15 0910  BP: 127/78  Pulse: 69  Temp: 98.3 F (36.8 C)  Resp: 18   Filed Weights  02/11/15 0910  Weight: 238 lb 8 oz (108.183 kg)    GENERAL:alert, no distress and comfortable SKIN: skin color, texture, turgor are normal, no rashes or significant lesions EYES: normal, Conjunctiva are pink and non-injected, sclera clear Musculoskeletal:no cyanosis of digits and no clubbing  NEURO: alert & oriented x 3 with fluent speech, no  focal motor/sensory deficits  LABORATORY DATA:  I have reviewed the data as listed No results found for this or any previous visit (from the past 48 hour(s)).  Lab Results  Component Value Date   WBC 5.5 02/06/2015   HGB 12.2 02/06/2015   HCT 37.0 02/06/2015   MCV 88.5 02/06/2015   PLT 266 02/06/2015    ASSESSMENT & PLAN:  Iron deficiency anemia The most likely cause of her anemia is due to chronic blood loss/malabsorption syndrome. We discussed some of the risks, benefits, and alternatives of intravenous iron infusions. The patient is symptomatic from anemia and the iron level is critically low. She tolerated oral iron supplement poorly and desires to achieved higher levels of iron faster for adequate hematopoesis. Some of the side-effects to be expected including risks of infusion reactions, phlebitis, headaches, nausea and fatigue.  The patient is willing to proceed. Patient education material was dispensed.  Goal is to keep ferritin level greater than 100 I recommend she continues to try taking iron supplement at night and prenatal vitamin in the morning I plan to recheck her levels after IV iron and see her back in 3 months  Menorrhagia with regular cycle She declined hysterectomy or hormonal therapy I recommend she returns to gynaecology for other options   Other fatigue She has prior thyroid surgery I recommend close monitoring of thyroid function by PCP I suspect her thyroid disorder is the most likely cause of her fatigue and anemia since her anemia is now corrected after recent IV iron.     All questions were answered. The patient knows to call the clinic with any problems, questions or concerns. No barriers to learning was detected.  I spent 15 minutes counseling the patient face to face. The total time spent in the appointment was 20 minutes and more than 50% was on counseling.     [facility], Xela Oregel, MD 9/6/20169:31 AM

## 2015-02-13 ENCOUNTER — Emergency Department (HOSPITAL_BASED_OUTPATIENT_CLINIC_OR_DEPARTMENT_OTHER): Payer: Medicaid Other

## 2015-02-13 ENCOUNTER — Other Ambulatory Visit: Payer: Self-pay

## 2015-02-13 ENCOUNTER — Encounter (HOSPITAL_BASED_OUTPATIENT_CLINIC_OR_DEPARTMENT_OTHER): Payer: Self-pay | Admitting: *Deleted

## 2015-02-13 ENCOUNTER — Emergency Department (HOSPITAL_BASED_OUTPATIENT_CLINIC_OR_DEPARTMENT_OTHER)
Admission: EM | Admit: 2015-02-13 | Discharge: 2015-02-13 | Disposition: A | Discharge status: Home / Self Care | Payer: Medicaid Other | Attending: Emergency Medicine | Admitting: Emergency Medicine

## 2015-02-13 DIAGNOSIS — I10 Essential (primary) hypertension: Secondary | ICD-10-CM | POA: Diagnosis not present

## 2015-02-13 DIAGNOSIS — J3489 Other specified disorders of nose and nasal sinuses: Secondary | ICD-10-CM | POA: Insufficient documentation

## 2015-02-13 DIAGNOSIS — M7989 Other specified soft tissue disorders: Secondary | ICD-10-CM | POA: Diagnosis not present

## 2015-02-13 DIAGNOSIS — Z79899 Other long term (current) drug therapy: Secondary | ICD-10-CM | POA: Diagnosis not present

## 2015-02-13 DIAGNOSIS — Z791 Long term (current) use of non-steroidal anti-inflammatories (NSAID): Secondary | ICD-10-CM | POA: Diagnosis not present

## 2015-02-13 DIAGNOSIS — Z3202 Encounter for pregnancy test, result negative: Secondary | ICD-10-CM | POA: Insufficient documentation

## 2015-02-13 DIAGNOSIS — Z862 Personal history of diseases of the blood and blood-forming organs and certain disorders involving the immune mechanism: Secondary | ICD-10-CM | POA: Insufficient documentation

## 2015-02-13 DIAGNOSIS — R079 Chest pain, unspecified: Secondary | ICD-10-CM | POA: Diagnosis not present

## 2015-02-13 DIAGNOSIS — Z720 Tobacco use: Secondary | ICD-10-CM | POA: Diagnosis not present

## 2015-02-13 DIAGNOSIS — R067 Sneezing: Secondary | ICD-10-CM | POA: Insufficient documentation

## 2015-02-13 DIAGNOSIS — F419 Anxiety disorder, unspecified: Secondary | ICD-10-CM | POA: Diagnosis not present

## 2015-02-13 DIAGNOSIS — F329 Major depressive disorder, single episode, unspecified: Secondary | ICD-10-CM | POA: Insufficient documentation

## 2015-02-13 DIAGNOSIS — R0602 Shortness of breath: Secondary | ICD-10-CM | POA: Diagnosis not present

## 2015-02-13 LAB — CBC WITH DIFFERENTIAL/PLATELET
Basophils Absolute: 0 10*3/uL (ref 0.0–0.1)
Basophils Relative: 0 % (ref 0–1)
EOS ABS: 0.1 10*3/uL (ref 0.0–0.7)
Eosinophils Relative: 1 % (ref 0–5)
HEMATOCRIT: 34.6 % — AB (ref 36.0–46.0)
HEMOGLOBIN: 11.3 g/dL — AB (ref 12.0–15.0)
LYMPHS ABS: 2 10*3/uL (ref 0.7–4.0)
Lymphocytes Relative: 29 % (ref 12–46)
MCH: 29 pg (ref 26.0–34.0)
MCHC: 32.7 g/dL (ref 30.0–36.0)
MCV: 88.7 fL (ref 78.0–100.0)
MONOS PCT: 9 % (ref 3–12)
Monocytes Absolute: 0.7 10*3/uL (ref 0.1–1.0)
NEUTROS PCT: 61 % (ref 43–77)
Neutro Abs: 4.3 10*3/uL (ref 1.7–7.7)
Platelets: 297 10*3/uL (ref 150–400)
RBC: 3.9 MIL/uL (ref 3.87–5.11)
RDW: 16.2 % — ABNORMAL HIGH (ref 11.5–15.5)
WBC: 7.1 10*3/uL (ref 4.0–10.5)

## 2015-02-13 LAB — URINALYSIS, ROUTINE W REFLEX MICROSCOPIC
Bilirubin Urine: NEGATIVE
GLUCOSE, UA: NEGATIVE mg/dL
Hgb urine dipstick: NEGATIVE
Ketones, ur: NEGATIVE mg/dL
LEUKOCYTES UA: NEGATIVE
Nitrite: NEGATIVE
PH: 7.5 (ref 5.0–8.0)
Protein, ur: NEGATIVE mg/dL
SPECIFIC GRAVITY, URINE: 1.011 (ref 1.005–1.030)
Urobilinogen, UA: 0.2 mg/dL (ref 0.0–1.0)

## 2015-02-13 LAB — BASIC METABOLIC PANEL
Anion gap: 7 (ref 5–15)
BUN: 12 mg/dL (ref 6–20)
CO2: 28 mmol/L (ref 22–32)
Calcium: 9.2 mg/dL (ref 8.9–10.3)
Chloride: 104 mmol/L (ref 101–111)
Creatinine, Ser: 0.92 mg/dL (ref 0.44–1.00)
GFR calc Af Amer: >60 mL/min (ref >60)
GLUCOSE: 84 mg/dL (ref 65–99)
POTASSIUM: 3.7 mmol/L (ref 3.5–5.1)
Sodium: 139 mmol/L (ref 135–145)

## 2015-02-13 LAB — D-DIMER, QUANTITATIVE: D-Dimer, Quant: 0.38 ug/mL-FEU (ref 0.00–0.48)

## 2015-02-13 LAB — BRAIN NATRIURETIC PEPTIDE: B Natriuretic Peptide: 47.2 pg/mL (ref 0.0–100.0)

## 2015-02-13 LAB — TROPONIN I: Troponin I: <0.03 ng/mL (ref <0.031)

## 2015-02-13 LAB — TSH: TSH: 2.441 u[IU]/mL (ref 0.350–4.500)

## 2015-02-13 LAB — PREGNANCY, URINE: Preg Test, Ur: NEGATIVE

## 2015-02-13 NOTE — ED Provider Notes (Signed)
CSN: [id number]     Arrival date & time 02/13/15  1710 History   First MD Initiated Contact with Patient 02/13/15 1711     Chief Complaint  Patient presents with  . Chest Pain   Nicole Nicholson is a 34 y.o. female with a history of hypertension, depression, anxiety, iron deficiency anemia and hypothyroidism who presents to the emergency department complaining of intermittent chest pain since yesterday. Patient which is intermittent episodes lasting a few minutes of chest pain which then resolve. She was a currently she has no chest pain. She reports she has had episodes of shortness of breath associated with that. The chest pain and currently she feels slightly short of breath. She was also feels like her jaw feels tight. Patient also complains of intermittent left arm and bilateral pedal edema since yesterday. Patient approaches have this issue previously and has had DVT ultrasounds performed a year ago on her left arm and leg which are unremarkable. The patient reports she takes Lasix by her primary care provider for edema and only takes this intermittently. Patient reports that her chest pain feels like it shooting when calms. She denies any current chest pain. She denies palpitations. Her last cycle was last week. The patient denies personal or close family history of MI. She denies personal or close family history of DVTs or PEs. Patient denies fevers, chills, cough, wheezing, nausea, vomiting, diarrhea, palpitations, current arm or leg swelling, or rashes.  (Consider location/radiation/quality/duration/timing/severity/associated sxs/prior Treatment) HPI  Past Medical History  Diagnosis Date  . Hypertension   . Depression   . Anxiety   . Iron deficiency anemia 11/13/2014   Past Surgical History  Procedure Laterality Date  . Cesarean section    . Breast surgery    . Tubal ligation    . Thyroidectomy     No family history on file. Social History  Substance Use Topics  . Smoking status:  Current Every Day Smoker -- 0.50 packs/day    Types: Cigarettes  . Smokeless tobacco: Never Used  . Alcohol Use: Yes   OB History    No data available     Review of Systems  Constitutional: Negative for fever and chills.  HENT: Positive for rhinorrhea and sneezing. Negative for congestion, sore throat and trouble swallowing.   Eyes: Negative for visual disturbance.  Respiratory: Positive for shortness of breath. Negative for cough, chest tightness and wheezing.   Cardiovascular: Positive for chest pain and leg swelling. Negative for palpitations.  Gastrointestinal: Negative for nausea, vomiting, diarrhea and blood in stool.  Genitourinary: Negative for dysuria and difficulty urinating.  Musculoskeletal: Negative for back pain and neck pain.  Skin: Negative for rash.  Neurological: Negative for weakness, numbness and headaches.      Allergies  Codeine; Darvocet; Percocet; and Tylenol  Home Medications   Prior to Admission medications   Medication Sig Start Date End Date Taking? Authorizing Provider  BYETTA 5 MCG PEN 5 MCG/0.02ML SOPN injection ADM 5 MCG [place] BID WC 01/23/15   Historical Provider, MD  clonazePAM (KLONOPIN) 1 MG tablet Take 1 mg by mouth at bedtime as needed for anxiety.    Historical Provider, MD  FLUoxetine (PROZAC) 20 MG capsule Take 20 mg by mouth daily.    Historical Provider, MD  furosemide (LASIX) 40 MG tablet TK 1 T PO D PRN 01/01/15   Historical Provider, MD  hydrOXYzine (ATARAX/VISTARIL) 25 MG tablet TK 1 T PO Q 4-6 H PRF ITCHINESS 01/16/15   Historical Provider, MD  ibuprofen (ADVIL,MOTRIN) 800 MG tablet Take 1 tablet (800 mg total) by mouth 3 (three) times daily. 11/13/14   Wandra Arthurs, MD  NATURE-THROID 97.5 MG TABS TK 1 AND 1/2 TS PO QD 01/29/15   Historical Provider, MD  ondansetron (ZOFRAN) 8 MG tablet Take 1 tablet (8 mg total) by mouth every 8 (eight) hours as needed for nausea or vomiting. 12/05/14   Heath Lark, MD   BP 115/75 mmHg  Pulse 70   Temp(Src) 98.5 F (36.9 C) (Oral)  Resp 20  Ht 5\' 7"  (1.702 m)  Wt 238 lb (107.956 kg)  BMI 37.27 kg/m2  SpO2 99%  LMP 02/05/2015 Physical Exam  Constitutional: She is oriented to person, place, and time. She appears well-developed and well-nourished. No distress.  Nontoxic appearing.  HENT:  Head: Normocephalic and atraumatic.  Mouth/Throat: Oropharynx is clear and moist. No oropharyngeal exudate.  Eyes: Conjunctivae are normal. Pupils are equal, round, and reactive to light. Right eye exhibits no discharge. Left eye exhibits no discharge.  Neck: Normal range of motion. Neck supple. No JVD present. No tracheal deviation present.  Cardiovascular: Normal rate, regular rhythm, normal heart sounds and intact distal pulses.  Exam reveals no gallop and no friction rub.   No murmur heard. Bilateral radial, posterior tibialis and dorsalis pedis pulses are intact.    Pulmonary/Chest: Effort normal and breath sounds normal. No respiratory distress. She has no wheezes. She has no rales. She exhibits tenderness.  Lungs are clear to auscultation bilaterally. Mild chest wall tenderness to palpation that does not completely reproduce her chest pain.  Abdominal: Soft. Bowel sounds are normal. She exhibits no distension. There is no tenderness. There is no guarding.  Musculoskeletal: Normal range of motion. She exhibits no edema or tenderness.  No arm or leg edema noted bilaterally. No left arm tenderness to palpation. No arm ecchymosis, edema or erythema noted. Patient has tenderness over her left shin but no calf edema or tenderness to her left leg. No pedal edema noted.  Lymphadenopathy:    She has no cervical adenopathy.  Neurological: She is alert and oriented to person, place, and time. Coordination normal.  Skin: Skin is warm and dry. No rash noted. She is not diaphoretic. No erythema. No pallor.  Psychiatric: She has a normal mood and affect. Her behavior is normal.  Nursing note and vitals  reviewed.   ED Course  Procedures (including critical care time) Labs Review Labs Reviewed  CBC WITH DIFFERENTIAL/PLATELET - Abnormal; Notable for the following:    Hemoglobin 11.3 (*)    HCT 34.6 (*)    RDW 16.2 (*)    All other components within normal limits  URINALYSIS, ROUTINE W REFLEX MICROSCOPIC (NOT AT [facility]) - Abnormal; Notable for the following:    APPearance CLOUDY (*)    All other components within normal limits  BASIC METABOLIC PANEL  TROPONIN I  BRAIN NATRIURETIC PEPTIDE  PREGNANCY, URINE  D-DIMER, QUANTITATIVE (NOT AT [facility])  TSH    Imaging Review Dg Chest 2 View  02/13/2015   CLINICAL DATA:  Severe chest pain with shortness of breath. Tingling in the left upper extremity.  EXAM: CHEST  2 VIEW  COMPARISON:  11/13/2014  FINDINGS: The heart size and mediastinal contours are within normal limits. Both lungs are clear. The visualized skeletal structures are unremarkable.  IMPRESSION: Normal exam.   Electronically Signed   By: Lorriane Shire M.D.   On: 02/13/2015 19:37   I have personally reviewed and evaluated these  images and lab results as part of my medical decision-making.   EKG Interpretation None      Filed Vitals:   02/13/15 1830 02/13/15 1847 02/13/15 1900 02/13/15 2000  BP: 135/80  130/64 115/75  Pulse: 70 72 66 70  Temp:      TempSrc:      Resp: 17 20 22 20   Height:      Weight:      SpO2: 96% 96% 98% 99%     MDM   Meds given in ED:  Medications - No data to display  New Prescriptions   No medications on file    Final diagnoses:  Chest pain, unspecified chest pain type   Patient presented with intermittent chest pain to the ED. Patient is to be discharged with recommendation to follow up with PCP in regards to today's hospital visit. Chest pain is not likely of cardiac or pulmonary etiology due to presentation, D-dimer negative, VSS, no tracheal deviation, no JVD or new murmur, RRR, breath sounds equal bilaterally, EKG without acute  abnormalities, negative troponin, and negative CXR. HEART score is 2. Patient has been advised to return to the ED if chest pain becomes exertional, associated with diaphoresis or nausea, radiates to left jaw/arm, worsens or becomes concerning in any way. TSH is still pending and I advised her to follow-up with primary care. Patient appears reliable for follow up and is agreeable to discharge. I advised the patient to follow-up with their primary care provider this week. I advised the patient to return to the emergency department with new or worsening symptoms or new concerns. The patient verbalized understanding and agreement with plan.    This patient was discussed with Dr. Colin Rhein who agrees with assessment and plan.     Waynetta Pean, PA-C 02/13/15 2047  Debby Freiberg, MD 02/24/15 [phone]

## 2015-02-13 NOTE — Discharge Instructions (Signed)
Chest Pain (Nonspecific) °It is often hard to give a specific diagnosis for the cause of chest pain. There is always a chance that your pain could be related to something serious, such as a heart attack or a blood clot in the lungs. You need to follow up with your health care provider for further evaluation. °CAUSES  °· Heartburn. °· Pneumonia or bronchitis. °· Anxiety or stress. °· Inflammation around your heart (pericarditis) or lung (pleuritis or pleurisy). °· A blood clot in the lung. °· A collapsed lung (pneumothorax). It can develop suddenly on its own (spontaneous pneumothorax) or from trauma to the chest. °· Shingles infection (herpes zoster virus). °The chest wall is composed of bones, muscles, and cartilage. Any of these can be the source of the pain. °· The bones can be bruised by injury. °· The muscles or cartilage can be strained by coughing or overwork. °· The cartilage can be affected by inflammation and become sore (costochondritis). °DIAGNOSIS  °Lab tests or other studies may be needed to find the cause of your pain. Your health care provider may have you take a test called an ambulatory electrocardiogram (ECG). An ECG records your heartbeat patterns over a 24-hour period. You may also have other tests, such as: °· Transthoracic echocardiogram (TTE). During echocardiography, sound waves are used to evaluate how blood flows through your heart. °· Transesophageal echocardiogram (TEE). °· Cardiac monitoring. This allows your health care provider to monitor your heart rate and rhythm in real time. °· Holter monitor. This is a portable device that records your heartbeat and can help diagnose heart arrhythmias. It allows your health care provider to track your heart activity for several days, if needed. °· Stress tests by exercise or by giving medicine that makes the heart beat faster. °TREATMENT  °· Treatment depends on what may be causing your chest pain. Treatment may include: °· Acid blockers for  heartburn. °· Anti-inflammatory medicine. °· Pain medicine for inflammatory conditions. °· Antibiotics if an infection is present. °· You may be advised to change lifestyle habits. This includes stopping smoking and avoiding alcohol, caffeine, and chocolate. °· You may be advised to keep your head raised (elevated) when sleeping. This reduces the chance of acid going backward from your stomach into your esophagus. °Most of the time, nonspecific chest pain will improve within 2-3 days with rest and mild pain medicine.  °HOME CARE INSTRUCTIONS  °· If antibiotics were prescribed, take them as directed. Finish them even if you start to feel better. °· For the next few days, avoid physical activities that bring on chest pain. Continue physical activities as directed. °· Do not use any tobacco products, including cigarettes, chewing tobacco, or electronic cigarettes. °· Avoid drinking alcohol. °· Only take medicine as directed by your health care provider. °· Follow your health care provider's suggestions for further testing if your chest pain does not go away. °· Keep any follow-up appointments you made. If you do not go to an appointment, you could develop lasting (chronic) problems with pain. If there is any problem keeping an appointment, call to reschedule. °SEEK MEDICAL CARE IF:  °· Your chest pain does not go away, even after treatment. °· You have a rash with blisters on your chest. °· You have a fever. °SEEK IMMEDIATE MEDICAL CARE IF:  °· You have increased chest pain or pain that spreads to your arm, neck, jaw, back, or abdomen. °· You have shortness of breath. °· You have an increasing cough, or you cough   up blood. °· You have severe back or abdominal pain. °· You feel nauseous or vomit. °· You have severe weakness. °· You faint. °· You have chills. °This is an emergency. Do not wait to see if the pain will go away. Get medical help at once. Call your local emergency services (911 in [place] Do not drive  yourself to the hospital. °MAKE SURE YOU:  °· Understand these instructions. °· Will watch your condition. °· Will get help right away if you are not doing well or get worse. °Document Released: 03/03/2005 Document Revised: 05/29/2013 Document Reviewed: 12/28/2007 °Exit[employer]® Patient Information ©2015 [employer]. This information is not intended to replace advice given to you by your health care provider. Make sure you discuss any questions you have with your health care provider. °Shortness of Breath °Shortness of breath means you have trouble breathing. It could also mean that you have a medical problem. You should get immediate medical care for shortness of breath. °CAUSES  °· Not enough oxygen in the air such as with high altitudes or a smoke-filled room. °· Certain lung diseases, infections, or problems. °· Heart disease or conditions, such as angina or heart failure. °· Low red blood cells (anemia). °· Poor physical fitness, which can cause shortness of breath when you exercise. °· Chest or back injuries or stiffness. °· Being overweight. °· Smoking. °· Anxiety, which can make you feel like you are not getting enough air. °DIAGNOSIS  °Serious medical problems can often be found during your physical exam. Tests may also be done to determine why you are having shortness of breath. Tests may include: °· Chest X-rays. °· Lung function tests. °· Blood tests. °· An electrocardiogram (ECG). °· An ambulatory electrocardiogram. An ambulatory ECG records your heartbeat patterns over a 24-hour period. °· Exercise testing. °· A transthoracic echocardiogram (TTE). During echocardiography, sound waves are used to evaluate how blood flows through your heart. °· A transesophageal echocardiogram (TEE). °· Imaging scans. °Your health care provider may not be able to find a cause for your shortness of breath after your exam. In this case, it is important to have a follow-up exam with your health care provider as directed.    °TREATMENT  °Treatment for shortness of breath depends on the cause of your symptoms and can vary greatly. °HOME CARE INSTRUCTIONS  °· Do not smoke. Smoking is a common cause of shortness of breath. If you smoke, ask for help to quit. °· Avoid being around chemicals or things that may bother your breathing, such as paint fumes and dust. °· Rest as needed. Slowly resume your usual activities. °· If medicines were prescribed, take them as directed for the full length of time directed. This includes oxygen and any inhaled medicines. °· Keep all follow-up appointments as directed by your health care provider. °SEEK MEDICAL CARE IF:  °· Your condition does not improve in the time expected. °· You have a hard time doing your normal activities even with rest. °· You have any new symptoms. °SEEK IMMEDIATE MEDICAL CARE IF:  °· Your shortness of breath gets worse. °· You feel light-headed, faint, or develop a cough not controlled with medicines. °· You start coughing up blood. °· You have pain with breathing. °· You have chest pain or pain in your arms, shoulders, or abdomen. °· You have a fever. °· You are unable to walk up stairs or exercise the way you normally do. °MAKE SURE YOU: °· Understand these instructions. °· Will watch your condition. °·   Will get help right away if you are not doing well or get worse. °Document Released: 02/16/2001 Document Revised: 05/29/2013 Document Reviewed: 08/09/2011 °ExitCare® Patient Information ©2015 [employer]. This information is not intended to replace advice given to you by your health care provider. Make sure you discuss any questions you have with your health care provider. ° °

## 2015-02-13 NOTE — ED Notes (Signed)
Chest pain woke her yesterday. Felt like an electrical current in her chest. Since that time she has had several more episodes. Her left arm is puffy.

## 2015-02-13 NOTE — ED Notes (Signed)
Pt states she wants to leave. EDPA notified and at bedside to speak to pt

## 2015-02-13 NOTE — ED Notes (Signed)
Patient voices concerns about her ED visit. Geraldine Solar, Charge Nurse, made aware.

## 2015-02-13 NOTE — ED Notes (Signed)
Patient transported to X-ray 

## 2015-02-13 NOTE — ED Notes (Deleted)
PA at bedside.

## 2015-04-25 ENCOUNTER — Other Ambulatory Visit: Payer: Self-pay | Admitting: Hematology and Oncology

## 2015-05-08 ENCOUNTER — Other Ambulatory Visit (HOSPITAL_BASED_OUTPATIENT_CLINIC_OR_DEPARTMENT_OTHER): Payer: Medicaid Other

## 2015-05-08 ENCOUNTER — Other Ambulatory Visit: Payer: Self-pay | Admitting: Hematology and Oncology

## 2015-05-08 DIAGNOSIS — D509 Iron deficiency anemia, unspecified: Secondary | ICD-10-CM | POA: Diagnosis present

## 2015-05-08 LAB — CBC & DIFF AND RETIC
BASO%: 0.3 % (ref 0.0–2.0)
BASOS ABS: 0 10*3/uL (ref 0.0–0.1)
EOS%: 1.7 % (ref 0.0–7.0)
Eosinophils Absolute: 0.1 10*3/uL (ref 0.0–0.5)
HEMATOCRIT: 35.4 % (ref 34.8–46.6)
HEMOGLOBIN: 11.8 g/dL (ref 11.6–15.9)
Immature Retic Fract: 10.6 % — ABNORMAL HIGH (ref 1.60–10.00)
LYMPH#: 1.8 10*3/uL (ref 0.9–3.3)
LYMPH%: 28.3 % (ref 14.0–49.7)
MCH: 29.1 pg (ref 25.1–34.0)
MCHC: 33.3 g/dL (ref 31.5–36.0)
MCV: 87.4 fL (ref 79.5–101.0)
MONO#: 0.6 10*3/uL (ref 0.1–0.9)
MONO%: 8.9 % (ref 0.0–14.0)
NEUT#: 3.9 10*3/uL (ref 1.5–6.5)
NEUT%: 60.8 % (ref 38.4–76.8)
PLATELETS: 296 10*3/uL (ref 145–400)
RBC: 4.05 10*6/uL (ref 3.70–5.45)
RDW: 13.1 % (ref 11.2–14.5)
RETIC %: 1.9 % (ref 0.70–2.10)
Retic Ct Abs: 76.95 10*3/uL (ref 33.70–90.70)
WBC: 6.4 10*3/uL (ref 3.9–10.3)
nRBC: 0 % (ref 0–0)

## 2015-05-08 LAB — FERRITIN CHCC: Ferritin: 12 ng/ml (ref 9–269)

## 2015-05-12 ENCOUNTER — Telehealth: Payer: Self-pay | Admitting: Hematology and Oncology

## 2015-05-12 ENCOUNTER — Telehealth: Payer: Self-pay | Admitting: *Deleted

## 2015-05-12 ENCOUNTER — Ambulatory Visit (HOSPITAL_BASED_OUTPATIENT_CLINIC_OR_DEPARTMENT_OTHER): Payer: Medicaid Other

## 2015-05-12 ENCOUNTER — Ambulatory Visit (HOSPITAL_BASED_OUTPATIENT_CLINIC_OR_DEPARTMENT_OTHER): Payer: Medicaid Other | Admitting: Hematology and Oncology

## 2015-05-12 ENCOUNTER — Encounter: Payer: Self-pay | Admitting: Hematology and Oncology

## 2015-05-12 VITALS — BP 129/68 | HR 65 | Temp 98.7°F | Resp 16

## 2015-05-12 VITALS — BP 134/74 | HR 73 | Temp 98.1°F | Resp 18 | Ht 67.0 in | Wt 239.1 lb

## 2015-05-12 DIAGNOSIS — D509 Iron deficiency anemia, unspecified: Secondary | ICD-10-CM

## 2015-05-12 DIAGNOSIS — N920 Excessive and frequent menstruation with regular cycle: Secondary | ICD-10-CM

## 2015-05-12 DIAGNOSIS — E611 Iron deficiency: Secondary | ICD-10-CM

## 2015-05-12 MED ORDER — SODIUM CHLORIDE 0.9 % IV SOLN
Freq: Once | INTRAVENOUS | Status: AC
  Administered 2015-05-12: 10:00:00 via INTRAVENOUS

## 2015-05-12 MED ORDER — FERUMOXYTOL INJECTION 510 MG/17 ML
510.0000 mg | Freq: Once | INTRAVENOUS | Status: AC
  Administered 2015-05-12: 510 mg via INTRAVENOUS
  Filled 2015-05-12: qty 17

## 2015-05-12 NOTE — Telephone Encounter (Signed)
Per staff message and POF I have scheduled appts. Advised scheduler of appts. JMW  

## 2015-05-12 NOTE — Telephone Encounter (Signed)
perp of to sch pt appt-gave pot copy of avs-sent MW email to sch fera-pt aware

## 2015-05-12 NOTE — Progress Notes (Signed)
[place] OFFICE PROGRESS NOTE  TAYLOR, AMANDA, PA-C SUMMARY OF HEMATOLOGIC HISTORY:  Nicole Nicholson is here because of recent ER visit for severe symptomatic anemia and severe fatigue  She was found to have abnormal CBC from recent ER/urgent care visit. She has "anemia all her life". She had multiple pregnancies in the past and was told she was anemic She denies recent chest pain on exertion, but does have shortness of breath on minimal exertion, pre-syncopal episodes, or palpitations. She had not noticed any recent bleeding such as epistaxis, hematuria or hematochezia The patient has occasional over the counter NSAID ingestion. She is not on antiplatelets agents.  She had no prior history or diagnosis of cancer. Her age appropriate screening programs are up-to-date. She complained of pica but eats a variety of diet. She never donated blood or received blood transfusion The patient was prescribed oral iron supplements and she takes 1 daily but tolerated that poorly. She has excessive menorrhagia monthly and was told she has fibroids/ovarian cysts. She had thyroid surgery in the past and complained of chronic fatigue She received 2 doses of IV iron in June and July 2016, and another dose in September 2016  INTERVAL HISTORY: Nicole Nicholson 34 y.o. female returns for further follow-up The patient denies any recent signs or symptoms of bleeding such as spontaneous epistaxis, hematuria or hematochezia. She continues to have menorrhagia. She have some chest pain and shortness of breath recently and was diagnosed with pulmonary hypertension elsewhere She is undergoing some evaluation for this.  I have reviewed the past medical history, past surgical history, social history and family history with the patient and they are unchanged from previous note.  ALLERGIES:  is allergic to codeine; darvocet; percocet; and tylenol.  MEDICATIONS:  Current Outpatient Prescriptions   Medication Sig Dispense Refill  . clonazePAM (KLONOPIN) 1 MG tablet Take 1 mg by mouth at bedtime as needed for anxiety.    . furosemide (LASIX) 40 MG tablet TK 1 T PO D PRN  1  . hydrOXYzine (ATARAX/VISTARIL) 25 MG tablet TK 1 T PO Q 4-6 H PRF ITCHINESS  1  . ibuprofen (ADVIL,MOTRIN) 800 MG tablet Take 1 tablet (800 mg total) by mouth 3 (three) times daily. 21 tablet 0  . NATURE-THROID 97.5 MG TABS TK 1 AND 1/2 TS PO QD  11  . ondansetron (ZOFRAN) 8 MG tablet Take 1 tablet (8 mg total) by mouth every 8 (eight) hours as needed for nausea or vomiting. 60 tablet 1   No current facility-administered medications for this visit.     REVIEW OF SYSTEMS:   Constitutional: Denies fevers, chills or night sweats Eyes: Denies blurriness of vision Ears, nose, mouth, throat, and face: Denies mucositis or sore throat Respiratory: Denies cough, dyspnea or wheezes Cardiovascular: Denies palpitation, chest discomfort or lower extremity swelling Gastrointestinal:  Denies nausea, heartburn or change in bowel habits Skin: Denies abnormal skin rashes Lymphatics: Denies new lymphadenopathy or easy bruising Neurological:Denies numbness, tingling or new weaknesses Behavioral/Psych: Mood is stable, no new changes  All other systems were reviewed with the patient and are negative.  PHYSICAL EXAMINATION: ECOG PERFORMANCE STATUS: 0 - Asymptomatic  Filed Vitals:   05/12/15 0923  BP: 134/74  Pulse: 73  Temp: 98.1 F (36.7 C)  Resp: 18   Filed Weights   05/12/15 0923  Weight: 239 lb 1.6 oz (108.455 kg)    GENERAL:alert, no distress and comfortable SKIN: skin color, texture, turgor are normal, no rashes or significant lesions  EYES: normal, Conjunctiva are pink and non-injected, sclera clear Musculoskeletal:no cyanosis of digits and no clubbing  NEURO: alert & oriented x 3 with fluent speech, no focal motor/sensory deficits  LABORATORY DATA:  I have reviewed the data as listed No results found for  this or any previous visit (from the past 48 hour(s)).  Lab Results  Component Value Date   WBC 6.4 05/08/2015   HGB 11.8 05/08/2015   HCT 35.4 05/08/2015   MCV 87.4 05/08/2015   PLT 296 05/08/2015   ASSESSMENT & PLAN:  Iron deficiency anemia The most likely cause of her iron deficiency is due to chronic blood loss/malabsorption menorrhagia. syndrome. Even though she is technically not anemic, she is getting profoundly iron deficient from We discussed some of the risks, benefits, and alternatives of intravenous iron infusions. The patient is symptomatic from anemia and the iron level is critically low. She tolerated oral iron supplement poorly and desires to achieved higher levels of iron faster for adequate hematopoesis. Some of the side-effects to be expected including risks of infusion reactions, phlebitis, headaches, nausea and fatigue.  The patient is willing to proceed. Patient education material was dispensed.  Goal is to keep ferritin level greater than 100 I recommend she continues to try taking iron supplement at night and prenatal vitamin in the morning I plan to recheck her levels after IV iron and see her back in 3 months  Menorrhagia with regular cycle She declined hysterectomy or hormonal therapy I recommend she returns to gynaecology for other options      All questions were answered. The patient knows to call the clinic with any problems, questions or concerns. No barriers to learning was detected.  I spent 15 minutes counseling the patient face to face. The total time spent in the appointment was 20 minutes and more than 50% was on counseling.     [name], MD 12/5/20169:40 AM

## 2015-05-12 NOTE — Patient Instructions (Signed)

## 2015-05-12 NOTE — Assessment & Plan Note (Signed)
The most likely cause of her iron deficiency is due to chronic blood loss/malabsorption menorrhagia. syndrome. Even though she is technically not anemic, she is getting profoundly iron deficient from We discussed some of the risks, benefits, and alternatives of intravenous iron infusions. The patient is symptomatic from anemia and the iron level is critically low. She tolerated oral iron supplement poorly and desires to achieved higher levels of iron faster for adequate hematopoesis. Some of the side-effects to be expected including risks of infusion reactions, phlebitis, headaches, nausea and fatigue.  The patient is willing to proceed. Patient education material was dispensed.  Goal is to keep ferritin level greater than 100 I recommend she continues to try taking iron supplement at night and prenatal vitamin in the morning I plan to recheck her levels after IV iron and see her back in 3 months

## 2015-05-12 NOTE — Assessment & Plan Note (Signed)
She declined hysterectomy or hormonal therapy I recommend she returns to gynaecology for other options  

## 2015-05-28 ENCOUNTER — Other Ambulatory Visit: Payer: Self-pay | Admitting: Hematology and Oncology

## 2015-08-11 ENCOUNTER — Other Ambulatory Visit: Payer: Self-pay | Admitting: Hematology and Oncology

## 2015-08-11 ENCOUNTER — Other Ambulatory Visit (HOSPITAL_BASED_OUTPATIENT_CLINIC_OR_DEPARTMENT_OTHER): Payer: Medicaid Other

## 2015-08-11 DIAGNOSIS — D509 Iron deficiency anemia, unspecified: Secondary | ICD-10-CM

## 2015-08-11 LAB — CBC & DIFF AND RETIC
BASO%: 0.4 % (ref 0.0–2.0)
BASOS ABS: 0 10*3/uL (ref 0.0–0.1)
EOS ABS: 0.1 10*3/uL (ref 0.0–0.5)
EOS%: 1.6 % (ref 0.0–7.0)
HEMATOCRIT: 32.8 % — AB (ref 34.8–46.6)
HEMOGLOBIN: 10.7 g/dL — AB (ref 11.6–15.9)
IMMATURE RETIC FRACT: 7.1 % (ref 1.60–10.00)
LYMPH#: 2 10*3/uL (ref 0.9–3.3)
LYMPH%: 35.6 % (ref 14.0–49.7)
MCH: 27 pg (ref 25.1–34.0)
MCHC: 32.6 g/dL (ref 31.5–36.0)
MCV: 82.8 fL (ref 79.5–101.0)
MONO#: 0.8 10*3/uL (ref 0.1–0.9)
MONO%: 13.3 % (ref 0.0–14.0)
NEUT#: 2.8 10*3/uL (ref 1.5–6.5)
NEUT%: 49.1 % (ref 38.4–76.8)
PLATELETS: 275 10*3/uL (ref 145–400)
RBC: 3.96 10*6/uL (ref 3.70–5.45)
RDW: 13.5 % (ref 11.2–14.5)
RETIC %: 1.51 % (ref 0.70–2.10)
RETIC CT ABS: 59.8 10*3/uL (ref 33.70–90.70)
WBC: 5.6 10*3/uL (ref 3.9–10.3)
nRBC: 0 % (ref 0–0)

## 2015-08-11 LAB — FERRITIN: Ferritin: 14 ng/ml (ref 9–269)

## 2015-08-18 ENCOUNTER — Telehealth: Payer: Self-pay | Admitting: Hematology and Oncology

## 2015-08-18 ENCOUNTER — Telehealth: Payer: Self-pay | Admitting: *Deleted

## 2015-08-18 ENCOUNTER — Ambulatory Visit (HOSPITAL_BASED_OUTPATIENT_CLINIC_OR_DEPARTMENT_OTHER): Payer: Medicaid Other

## 2015-08-18 ENCOUNTER — Encounter: Payer: Self-pay | Admitting: Hematology and Oncology

## 2015-08-18 ENCOUNTER — Ambulatory Visit (HOSPITAL_BASED_OUTPATIENT_CLINIC_OR_DEPARTMENT_OTHER): Payer: Medicaid Other | Admitting: Hematology and Oncology

## 2015-08-18 VITALS — BP 125/68 | HR 82 | Temp 98.0°F | Resp 18 | Ht 67.0 in | Wt 241.4 lb

## 2015-08-18 VITALS — BP 122/78 | HR 86 | Temp 98.2°F | Resp 20

## 2015-08-18 DIAGNOSIS — R5383 Other fatigue: Secondary | ICD-10-CM

## 2015-08-18 DIAGNOSIS — D509 Iron deficiency anemia, unspecified: Secondary | ICD-10-CM | POA: Diagnosis present

## 2015-08-18 DIAGNOSIS — N920 Excessive and frequent menstruation with regular cycle: Secondary | ICD-10-CM

## 2015-08-18 MED ORDER — SODIUM CHLORIDE 0.9 % IV SOLN
Freq: Once | INTRAVENOUS | Status: AC
  Administered 2015-08-18: 10:00:00 via INTRAVENOUS

## 2015-08-18 MED ORDER — SODIUM CHLORIDE 0.9 % IV SOLN
510.0000 mg | Freq: Once | INTRAVENOUS | Status: AC
  Administered 2015-08-18: 510 mg via INTRAVENOUS
  Filled 2015-08-18: qty 17

## 2015-08-18 NOTE — Assessment & Plan Note (Signed)
The most likely cause of her anemia is due to chronic blood loss/malabsorption syndrome. We discussed some of the risks, benefits, and alternatives of intravenous iron infusions. The patient is symptomatic from anemia and the iron level is critically low. She tolerated oral iron supplement poorly and desires to achieved higher levels of iron faster for adequate hematopoesis. Some of the side-effects to be expected including risks of infusion reactions, phlebitis, headaches, nausea and fatigue.  The patient is willing to proceed. Patient education material was dispensed.  Goal is to keep ferritin level greater than 50 I recommend she take prenatal vitamin daily for effective erythropoiesis I recommend giving HER-2 doses week apart and that should last her minimum 4 months before I bring her back for recheck of her blood count and iron studies

## 2015-08-18 NOTE — Progress Notes (Signed)
[facility] OFFICE PROGRESS NOTE  Nicholson, AMANDA, PA-C SUMMARY OF HEMATOLOGIC HISTORY:  Nicole Nicholson is here because of recent ER visit for severe symptomatic anemia and severe fatigue  She was found to have abnormal CBC from recent ER/urgent care visit. She has "anemia all her life". She had multiple pregnancies in the past and was told she was anemic She denies recent chest pain on exertion, but does have shortness of breath on minimal exertion, pre-syncopal episodes, or palpitations. She had not noticed any recent bleeding such as epistaxis, hematuria or hematochezia The patient has occasional over the counter NSAID ingestion. She is not on antiplatelets agents.  She had no prior history or diagnosis of cancer. Her age appropriate screening programs are up-to-date. She complained of pica but eats a variety of diet. She never donated blood or received blood transfusion The patient was prescribed oral iron supplements and she takes 1 daily but tolerated that poorly. She has excessive menorrhagia monthly and was told she has fibroids/ovarian cysts. She had thyroid surgery in the past and complained of chronic fatigue She received multiple doses of IV iron in 2016 with improvement of energy level INTERVAL HISTORY: Nicole Nicholson 35 y.o. female returns for further follow-up. She complained of fatigue again. She continues to have heavy menstruation on a monthly basis. The patient denies any recent signs or symptoms of bleeding such as spontaneous epistaxis, hematuria or hematochezia.   I have reviewed the past medical history, past surgical history, social history and family history with the patient and they are unchanged from previous note.  ALLERGIES:  is allergic to codeine; darvocet; percocet; and tylenol.  MEDICATIONS:  Current Outpatient Prescriptions  Medication Sig Dispense Refill  . clonazePAM (KLONOPIN) 1 MG tablet Take 1 mg by mouth at bedtime as needed for  anxiety.    . furosemide (LASIX) 40 MG tablet TK 1 T PO D PRN  1  . hydrOXYzine (ATARAX/VISTARIL) 25 MG tablet TK 1 T PO Q 4-6 H PRF ITCHINESS  1  . ibuprofen (ADVIL,MOTRIN) 800 MG tablet Take 1 tablet (800 mg total) by mouth 3 (three) times daily. 21 tablet 0  . NATURE-THROID 97.5 MG TABS TK 1 AND 1/2 TS PO QD  11  . ondansetron (ZOFRAN) 8 MG tablet Take 1 tablet (8 mg total) by mouth every 8 (eight) hours as needed for nausea or vomiting. 60 tablet 1   No current facility-administered medications for this visit.     REVIEW OF SYSTEMS:   Constitutional: Denies fevers, chills or night sweats Eyes: Denies blurriness of vision Ears, nose, mouth, throat, and face: Denies mucositis or sore throat Respiratory: Denies cough, dyspnea or wheezes Cardiovascular: Denies palpitation, chest discomfort or lower extremity swelling Gastrointestinal:  Denies nausea, heartburn or change in bowel habits Skin: Denies abnormal skin rashes Lymphatics: Denies new lymphadenopathy or easy bruising Neurological:Denies numbness, tingling or new weaknesses Behavioral/Psych: Mood is stable, no new changes  All other systems were reviewed with the patient and are negative.  PHYSICAL EXAMINATION: ECOG PERFORMANCE STATUS: 1 - Symptomatic but completely ambulatory  Filed Vitals:   08/18/15 0959  BP: 125/68  Pulse: 82  Temp: 98 F (36.7 C)  Resp: 18   Filed Weights   08/18/15 0959  Weight: 241 lb 6.4 oz (109.498 kg)    GENERAL:alert, no distress and comfortable SKIN: skin color, texture, turgor are normal, no rashes or significant lesions EYES: normal, Conjunctiva are pink and non-injected, sclera clear Musculoskeletal:no cyanosis of digits and no  clubbing  NEURO: alert & oriented x 3 with fluent speech, no focal motor/sensory deficits  LABORATORY DATA:  I have reviewed the data as listed No results found for this or any previous visit (from the past 48 hour(s)).  Lab Results  Component Value Date    WBC 5.6 08/11/2015   HGB 10.7* 08/11/2015   HCT 32.8* 08/11/2015   MCV 82.8 08/11/2015   PLT 275 08/11/2015    ASSESSMENT & PLAN:  Iron deficiency anemia The most likely cause of her anemia is due to chronic blood loss/malabsorption syndrome. We discussed some of the risks, benefits, and alternatives of intravenous iron infusions. The patient is symptomatic from anemia and the iron level is critically low. She tolerated oral iron supplement poorly and desires to achieved higher levels of iron faster for adequate hematopoesis. Some of the side-effects to be expected including risks of infusion reactions, phlebitis, headaches, nausea and fatigue.  The patient is willing to proceed. Patient education material was dispensed.  Goal is to keep ferritin level greater than 50 I recommend she take prenatal vitamin daily for effective erythropoiesis I recommend giving HER-2 doses week apart and that should last her minimum 4 months before I bring her back for recheck of her blood count and iron studies  Menorrhagia with regular cycle She declined hysterectomy or hormonal therapy I recommend she returns to gynaecology for other options    Other fatigue Likely due to anemia Will proceed with IV iron replacement as above    All questions were answered. The patient knows to call the clinic with any problems, questions or concerns. No barriers to learning was detected.  I spent 15 minutes counseling the patient face to face. The total time spent in the appointment was 20 minutes and more than 50% was on counseling.     [facility], Avanna Sowder, MD 3/13/201710:12 AM

## 2015-08-18 NOTE — Patient Instructions (Signed)

## 2015-08-18 NOTE — Assessment & Plan Note (Signed)
Likely due to anemia Will proceed with IV iron replacement as above

## 2015-08-18 NOTE — Telephone Encounter (Signed)
per pof to sch pt appt-sent MW email to sch fera-pt to get updated copy in trmt room

## 2015-08-18 NOTE — Telephone Encounter (Signed)
Per staff message and POF I have scheduled appts. Advised scheduler of appts. JMW  

## 2015-08-18 NOTE — Assessment & Plan Note (Signed)
She declined hysterectomy or hormonal therapy I recommend she returns to gynaecology for other options  

## 2015-08-25 ENCOUNTER — Ambulatory Visit (HOSPITAL_BASED_OUTPATIENT_CLINIC_OR_DEPARTMENT_OTHER): Payer: Medicaid Other

## 2015-08-25 VITALS — BP 109/85 | HR 86 | Temp 98.0°F | Resp 18

## 2015-08-25 DIAGNOSIS — N920 Excessive and frequent menstruation with regular cycle: Secondary | ICD-10-CM | POA: Diagnosis not present

## 2015-08-25 DIAGNOSIS — D509 Iron deficiency anemia, unspecified: Secondary | ICD-10-CM | POA: Diagnosis not present

## 2015-08-25 MED ORDER — SODIUM CHLORIDE 0.9 % IV SOLN
510.0000 mg | Freq: Once | INTRAVENOUS | Status: AC
  Administered 2015-08-25: 510 mg via INTRAVENOUS
  Filled 2015-08-25: qty 17

## 2015-08-25 MED ORDER — SODIUM CHLORIDE 0.9 % IV SOLN
Freq: Once | INTRAVENOUS | Status: AC
  Administered 2015-08-25: 15:00:00 via INTRAVENOUS

## 2015-08-25 NOTE — Patient Instructions (Signed)

## 2015-08-28 ENCOUNTER — Other Ambulatory Visit: Payer: Self-pay | Admitting: Hematology and Oncology

## 2015-12-19 ENCOUNTER — Other Ambulatory Visit: Payer: Self-pay | Admitting: Hematology and Oncology

## 2015-12-19 DIAGNOSIS — D509 Iron deficiency anemia, unspecified: Secondary | ICD-10-CM

## 2015-12-22 ENCOUNTER — Other Ambulatory Visit (HOSPITAL_BASED_OUTPATIENT_CLINIC_OR_DEPARTMENT_OTHER): Payer: Medicaid Other

## 2015-12-22 ENCOUNTER — Other Ambulatory Visit: Payer: Self-pay | Admitting: Hematology and Oncology

## 2015-12-22 ENCOUNTER — Telehealth: Payer: Self-pay | Admitting: *Deleted

## 2015-12-22 DIAGNOSIS — D509 Iron deficiency anemia, unspecified: Secondary | ICD-10-CM

## 2015-12-22 LAB — CBC & DIFF AND RETIC
BASO%: 0.3 % (ref 0.0–2.0)
Basophils Absolute: 0 10*3/uL (ref 0.0–0.1)
EOS%: 2.2 % (ref 0.0–7.0)
Eosinophils Absolute: 0.1 10*3/uL (ref 0.0–0.5)
HCT: 33.2 % — ABNORMAL LOW (ref 34.8–46.6)
HGB: 11.2 g/dL — ABNORMAL LOW (ref 11.6–15.9)
IMMATURE RETIC FRACT: 6.5 % (ref 1.60–10.00)
LYMPH%: 36.9 % (ref 14.0–49.7)
MCH: 28.9 pg (ref 25.1–34.0)
MCHC: 33.7 g/dL (ref 31.5–36.0)
MCV: 85.8 fL (ref 79.5–101.0)
MONO#: 0.5 10*3/uL (ref 0.1–0.9)
MONO%: 8.8 % (ref 0.0–14.0)
NEUT%: 51.8 % (ref 38.4–76.8)
NEUTROS ABS: 3 10*3/uL (ref 1.5–6.5)
Platelets: 312 10*3/uL (ref 145–400)
RBC: 3.87 10*6/uL (ref 3.70–5.45)
RDW: 13 % (ref 11.2–14.5)
Retic %: 1.57 % (ref 0.70–2.10)
Retic Ct Abs: 60.76 10*3/uL (ref 33.70–90.70)
WBC: 5.9 10*3/uL (ref 3.9–10.3)
lymph#: 2.2 10*3/uL (ref 0.9–3.3)

## 2015-12-22 LAB — FERRITIN: FERRITIN: 14 ng/mL (ref 9–269)

## 2015-12-22 NOTE — Telephone Encounter (Signed)
Pt notified of message below. Will keep appts

## 2015-12-22 NOTE — Telephone Encounter (Signed)
Left message to call.

## 2015-12-22 NOTE — Telephone Encounter (Signed)
-----   Message from Heath Lark, MD sent at 12/22/2015 10:55 AM EDT ----- Regarding: iv iron pls let her know she will need IV iron Please make sure she keeps her appt next week ----- Message -----    From: [facility] Interface    Sent: 12/22/2015   9:39 AM      To: Heath Lark, MD

## 2015-12-26 ENCOUNTER — Other Ambulatory Visit: Payer: Self-pay | Admitting: Hematology and Oncology

## 2015-12-29 ENCOUNTER — Ambulatory Visit (HOSPITAL_BASED_OUTPATIENT_CLINIC_OR_DEPARTMENT_OTHER): Payer: Medicaid Other

## 2015-12-29 ENCOUNTER — Ambulatory Visit (HOSPITAL_BASED_OUTPATIENT_CLINIC_OR_DEPARTMENT_OTHER): Payer: Medicaid Other | Admitting: Hematology and Oncology

## 2015-12-29 ENCOUNTER — Encounter: Payer: Self-pay | Admitting: Hematology and Oncology

## 2015-12-29 ENCOUNTER — Telehealth: Payer: Self-pay | Admitting: Hematology and Oncology

## 2015-12-29 VITALS — BP 118/72 | HR 74 | Temp 99.2°F | Resp 18

## 2015-12-29 VITALS — BP 134/69 | HR 70 | Temp 98.3°F | Resp 18 | Ht 67.0 in | Wt 228.2 lb

## 2015-12-29 DIAGNOSIS — Z716 Tobacco abuse counseling: Secondary | ICD-10-CM

## 2015-12-29 DIAGNOSIS — D509 Iron deficiency anemia, unspecified: Secondary | ICD-10-CM

## 2015-12-29 DIAGNOSIS — N920 Excessive and frequent menstruation with regular cycle: Secondary | ICD-10-CM | POA: Diagnosis not present

## 2015-12-29 MED ORDER — SODIUM CHLORIDE 0.9 % IV SOLN
Freq: Once | INTRAVENOUS | Status: AC
  Administered 2015-12-29: 11:00:00 via INTRAVENOUS

## 2015-12-29 MED ORDER — SODIUM CHLORIDE 0.9 % IV SOLN
510.0000 mg | Freq: Once | INTRAVENOUS | Status: AC
  Administered 2015-12-29: 510 mg via INTRAVENOUS
  Filled 2015-12-29: qty 17

## 2015-12-29 NOTE — Patient Instructions (Addendum)

## 2015-12-29 NOTE — Telephone Encounter (Signed)
Gave patient avs report and appointments for July, November and December.

## 2015-12-29 NOTE — Progress Notes (Signed)
[facility] PROGRESS [facility]  TAYLOR, AMANDA, PA-C SUMMARY OF HEMATOLOGIC HISTORY:  Nicole Nicholson is here because of recent ER visit for severe symptomatic anemia and severe fatigue  She was found to have abnormal CBC from recent ER/urgent care visit. She has "anemia all her life". She had multiple pregnancies in the past and was told she was anemic She denies recent chest pain on exertion, but does have shortness of breath on minimal exertion, pre-syncopal episodes, or palpitations. She had not noticed any recent bleeding such as epistaxis, hematuria or hematochezia The patient has occasional over the counter NSAID ingestion. She is not on antiplatelets agents.  She had no prior history or diagnosis of cancer. Her age appropriate screening programs are up-to-date. She complained of pica but eats a variety of diet. She never donated blood or received blood transfusion The patient was prescribed oral iron supplements and she takes 1 daily but tolerated that poorly. She has excessive menorrhagia monthly and was told she has fibroids/ovarian cysts. She had thyroid surgery in the past and complained of chronic fatigue She received multiple doses of IV iron in 2016 & 2017 with improvement of energy level INTERVAL HISTORY: Nicole Nicholson 35 y.o. female returns for further follow-up. She complained of fatigue again. She continues to have heavy menstruation on a monthly basis. The patient denies any recent signs or symptoms of bleeding such as spontaneous epistaxis, hematuria or hematochezia. She is not interested to discuss further management for menorrhagia  I have reviewed the past medical history, past surgical history, social history and family history with the patient and they are unchanged from previous note.  ALLERGIES:  is allergic to codeine; darvocet [propoxyphene n-acetaminophen]; percocet [oxycodone-acetaminophen]; and tylenol [acetaminophen].  MEDICATIONS:   Current Outpatient Prescriptions  Medication Sig Dispense Refill  . clonazePAM (KLONOPIN) 1 MG tablet Take 1 mg by mouth at bedtime as needed for anxiety.    . furosemide (LASIX) 40 MG tablet TK 1 T PO D PRN  1  . hydrOXYzine (ATARAX/VISTARIL) 25 MG tablet TK 1 T PO Q 4-6 H PRF ITCHINESS  1  . ibuprofen (ADVIL,MOTRIN) 800 MG tablet Take 1 tablet (800 mg total) by mouth 3 (three) times daily. 21 tablet 0  . NATURE-THROID 97.5 MG TABS TK 1 AND 1/2 TS PO QD  11  . ondansetron (ZOFRAN) 8 MG tablet Take 1 tablet (8 mg total) by mouth every 8 (eight) hours as needed for nausea or vomiting. 60 tablet 1   No current facility-administered medications for this visit.      REVIEW OF SYSTEMS:   Constitutional: Denies fevers, chills or night sweats Eyes: Denies blurriness of vision Ears, nose, mouth, throat, and face: Denies mucositis or sore throat Respiratory: Denies cough, dyspnea or wheezes Cardiovascular: Denies palpitation, chest discomfort or lower extremity swelling Gastrointestinal:  Denies nausea, heartburn or change in bowel habits Skin: Denies abnormal skin rashes Lymphatics: Denies new lymphadenopathy or easy bruising Neurological:Denies numbness, tingling or new weaknesses Behavioral/Psych: Mood is stable, no new changes  All other systems were reviewed with the patient and are negative.  PHYSICAL EXAMINATION: ECOG PERFORMANCE STATUS: 1 - Symptomatic but completely ambulatory  Vitals:   12/29/15 1033  BP: 134/69  Pulse: 70  Resp: 18  Temp: 98.3 F (36.8 C)   Filed Weights   12/29/15 1033  Weight: 228 lb 3.2 oz (103.5 kg)    GENERAL:alert, no distress and comfortable SKIN: skin color, texture, turgor are normal, no rashes or significant lesions   EYES: normal, Conjunctiva are pink and non-injected, sclera clear OROPHARYNX:no exudate, no erythema and lips, buccal mucosa, and tongue normal  NECK: supple, thyroid normal size, non-tender, without nodularity LYMPH:  no  palpable lymphadenopathy in the cervical, axillary or inguinal LUNGS: clear to auscultation and percussion with normal breathing effort HEART: regular rate & rhythm and no murmurs and no lower extremity edema ABDOMEN:abdomen soft, non-tender and normal bowel sounds Musculoskeletal:no cyanosis of digits and no clubbing  NEURO: alert & oriented x 3 with fluent speech, no focal motor/sensory deficits  LABORATORY DATA:  I have reviewed the data as listed     Component Value Date/Time   NA 139 02/13/2015 1815   K 3.7 02/13/2015 1815   CL 104 02/13/2015 1815   CO2 28 02/13/2015 1815   GLUCOSE 84 02/13/2015 1815   BUN 12 02/13/2015 1815   CREATININE 0.92 02/13/2015 1815   CALCIUM 9.2 02/13/2015 1815   PROT 7.6 11/13/2014 0720   ALBUMIN 3.5 11/13/2014 0720   AST 21 11/13/2014 0720   ALT 12 (L) 11/13/2014 0720   ALKPHOS 54 11/13/2014 0720   BILITOT 0.1 (L) 11/13/2014 0720   GFRNONAA >60 02/13/2015 1815   GFRAA >60 02/13/2015 1815    No results found for: SPEP, UPEP  Lab Results  Component Value Date   WBC 5.9 12/22/2015   NEUTROABS 3.0 12/22/2015   HGB 11.2 (L) 12/22/2015   HCT 33.2 (L) 12/22/2015   MCV 85.8 12/22/2015   PLT 312 12/22/2015      Chemistry      Component Value Date/Time   NA 139 02/13/2015 1815   K 3.7 02/13/2015 1815   CL 104 02/13/2015 1815   CO2 28 02/13/2015 1815   BUN 12 02/13/2015 1815   CREATININE 0.92 02/13/2015 1815      Component Value Date/Time   CALCIUM 9.2 02/13/2015 1815   ALKPHOS 54 11/13/2014 0720   AST 21 11/13/2014 0720   ALT 12 (L) 11/13/2014 0720   BILITOT 0.1 (L) 11/13/2014 0720       ASSESSMENT & PLAN:  Iron deficiency anemia The most likely cause of her anemia is due to chronic blood loss/malabsorption syndrome. We discussed some of the risks, benefits, and alternatives of intravenous iron infusions. The patient is symptomatic from anemia and the iron level is critically low. She tolerated oral iron supplement poorly and  desires to achieved higher levels of iron faster for adequate hematopoesis. Some of the side-effects to be expected including risks of infusion reactions, phlebitis, headaches, nausea and fatigue.  The patient is willing to proceed. Patient education material was dispensed.  Goal is to keep ferritin level greater than 50 I recommend she take prenatal vitamin daily for effective erythropoiesis I recommend giving HER-2 doses a week apart and that should last her minimum 4 months before I bring her back for recheck of her blood count and iron studies  Menorrhagia with regular cycle She declined hysterectomy or hormonal therapy I recommend she returns to gynaecology for other options    Tobacco abuse counseling I spent some time counseling the patient the importance of tobacco cessation. She is currently not interested to quit now.   Orders Placed This Encounter  Procedures  . Ferritin    Standing Status:   Future    Standing Expiration Date:   02/01/2017  . CBC & Diff and Retic    Standing Status:   Future    Standing Expiration Date:   02/01/2017  . Ambulatory referral to  Gastroenterology    Referral Priority:   Routine    Referral Type:   Consultation    Referral Reason:   Specialty Services Required    Number of Visits Requested:   1     All questions were answered. The patient knows to call the clinic with any problems, questions or concerns. No barriers to learning was detected.  I spent 15 minutes counseling the patient face to face. The total time spent in the appointment was 20 minutes and more than 50% was on counseling.     GORSUCH, NI, MD 7/24/20174:00 PM    

## 2015-12-30 NOTE — Assessment & Plan Note (Addendum)
The most likely cause of her anemia is due to chronic blood loss/malabsorption syndrome. We discussed some of the risks, benefits, and alternatives of intravenous iron infusions. The patient is symptomatic from anemia and the iron level is critically low. She tolerated oral iron supplement poorly and desires to achieved higher levels of iron faster for adequate hematopoesis. Some of the side-effects to be expected including risks of infusion reactions, phlebitis, headaches, nausea and fatigue.  The patient is willing to proceed. Patient education material was dispensed.  Goal is to keep ferritin level greater than 50 I recommend she take prenatal vitamin daily for effective erythropoiesis I recommend giving HER-2 doses a week apart and that should last her minimum 4 months before I bring her back for recheck of her blood count and iron studies We discussed Gi evaluation for IDA and she agreed to proceed

## 2015-12-30 NOTE — Assessment & Plan Note (Signed)
She declined hysterectomy or hormonal therapy I recommend she returns to gynaecology for other options

## 2015-12-30 NOTE — Assessment & Plan Note (Signed)
I spent some time counseling the patient the importance of tobacco cessation. She is currently not interested to quit now. 

## 2016-01-05 ENCOUNTER — Ambulatory Visit (HOSPITAL_BASED_OUTPATIENT_CLINIC_OR_DEPARTMENT_OTHER): Payer: Medicaid Other

## 2016-01-05 VITALS — BP 103/75 | HR 73 | Temp 98.5°F | Resp 18

## 2016-01-05 DIAGNOSIS — N920 Excessive and frequent menstruation with regular cycle: Secondary | ICD-10-CM

## 2016-01-05 DIAGNOSIS — D509 Iron deficiency anemia, unspecified: Secondary | ICD-10-CM

## 2016-01-05 MED ORDER — SODIUM CHLORIDE 0.9 % IV SOLN
Freq: Once | INTRAVENOUS | Status: AC
  Administered 2016-01-05: 13:00:00 via INTRAVENOUS

## 2016-01-05 MED ORDER — SODIUM CHLORIDE 0.9 % IV SOLN
510.0000 mg | Freq: Once | INTRAVENOUS | Status: AC
  Administered 2016-01-05: 510 mg via INTRAVENOUS
  Filled 2016-01-05: qty 17

## 2016-01-05 NOTE — Patient Instructions (Signed)

## 2016-02-02 ENCOUNTER — Encounter: Payer: Self-pay | Admitting: Internal Medicine

## 2016-04-09 ENCOUNTER — Ambulatory Visit: Payer: Medicaid Other | Admitting: Internal Medicine

## 2016-04-30 ENCOUNTER — Other Ambulatory Visit (HOSPITAL_BASED_OUTPATIENT_CLINIC_OR_DEPARTMENT_OTHER): Payer: Medicaid Other

## 2016-04-30 ENCOUNTER — Telehealth: Payer: Self-pay | Admitting: *Deleted

## 2016-04-30 DIAGNOSIS — D509 Iron deficiency anemia, unspecified: Secondary | ICD-10-CM | POA: Diagnosis not present

## 2016-04-30 LAB — CBC & DIFF AND RETIC
BASO%: 0.4 % (ref 0.0–2.0)
Basophils Absolute: 0 10*3/uL (ref 0.0–0.1)
EOS%: 2.3 % (ref 0.0–7.0)
Eosinophils Absolute: 0.1 10*3/uL (ref 0.0–0.5)
HCT: 30.6 % — ABNORMAL LOW (ref 34.8–46.6)
HEMOGLOBIN: 9.9 g/dL — AB (ref 11.6–15.9)
IMMATURE RETIC FRACT: 9.5 % (ref 1.60–10.00)
LYMPH%: 32.9 % (ref 14.0–49.7)
MCH: 27.7 pg (ref 25.1–34.0)
MCHC: 32.4 g/dL (ref 31.5–36.0)
MCV: 85.5 fL (ref 79.5–101.0)
MONO#: 0.5 10*3/uL (ref 0.1–0.9)
MONO%: 8.6 % (ref 0.0–14.0)
NEUT%: 55.8 % (ref 38.4–76.8)
NEUTROS ABS: 3 10*3/uL (ref 1.5–6.5)
PLATELETS: 259 10*3/uL (ref 145–400)
RBC: 3.58 10*6/uL — AB (ref 3.70–5.45)
RDW: 13 % (ref 11.2–14.5)
Retic %: 1.66 % (ref 0.70–2.10)
Retic Ct Abs: 59.43 10*3/uL (ref 33.70–90.70)
WBC: 5.3 10*3/uL (ref 3.9–10.3)
lymph#: 1.8 10*3/uL (ref 0.9–3.3)

## 2016-04-30 LAB — FERRITIN: FERRITIN: 11 ng/mL (ref 9–269)

## 2016-04-30 NOTE — Telephone Encounter (Signed)
Nicole Nicholson came in today for labs. States she cannot come at 1000 on 12/1 to see Dr Alvy Bimler and iron. Can come 1230 or later. Has presentation at school that morning.   Appt times can be changed to 1345 Dr Alvy Bimler, 1445 iron.  Please confirm this is OK. Then call patient to confirm- it is OK to leave a message on her phone.

## 2016-05-04 ENCOUNTER — Telehealth: Payer: Self-pay | Admitting: *Deleted

## 2016-05-04 ENCOUNTER — Other Ambulatory Visit: Payer: Self-pay | Admitting: Hematology and Oncology

## 2016-05-04 NOTE — Telephone Encounter (Signed)
-----   Message from Heath Lark, MD sent at 05/04/2016  6:17 AM EST ----- Regarding: labs Repeat labs showed iron def Please remind her to keep her appt as scheduled ----- Message ----- From: Interface, [facility] Sent: 04/30/2016  11:19 AM To: Heath Lark, MD

## 2016-05-04 NOTE — Telephone Encounter (Signed)
Pls send a scheduler message to move her appt OK

## 2016-05-07 ENCOUNTER — Encounter: Payer: Self-pay | Admitting: Hematology and Oncology

## 2016-05-07 ENCOUNTER — Ambulatory Visit (HOSPITAL_BASED_OUTPATIENT_CLINIC_OR_DEPARTMENT_OTHER): Payer: Medicaid Other

## 2016-05-07 ENCOUNTER — Telehealth: Payer: Self-pay | Admitting: Hematology and Oncology

## 2016-05-07 ENCOUNTER — Ambulatory Visit (HOSPITAL_BASED_OUTPATIENT_CLINIC_OR_DEPARTMENT_OTHER): Payer: Medicaid Other | Admitting: Hematology and Oncology

## 2016-05-07 ENCOUNTER — Other Ambulatory Visit: Payer: Medicaid Other

## 2016-05-07 VITALS — BP 135/73 | HR 73 | Temp 97.8°F | Resp 18 | Ht 67.0 in | Wt 233.5 lb

## 2016-05-07 VITALS — BP 139/70 | HR 67 | Temp 98.5°F | Resp 18

## 2016-05-07 DIAGNOSIS — D509 Iron deficiency anemia, unspecified: Secondary | ICD-10-CM

## 2016-05-07 DIAGNOSIS — N920 Excessive and frequent menstruation with regular cycle: Secondary | ICD-10-CM

## 2016-05-07 MED ORDER — SODIUM CHLORIDE 0.9 % IV SOLN
Freq: Once | INTRAVENOUS | Status: AC
  Administered 2016-05-07: 15:00:00 via INTRAVENOUS

## 2016-05-07 MED ORDER — SODIUM CHLORIDE 0.9 % IV SOLN
510.0000 mg | Freq: Once | INTRAVENOUS | Status: AC
  Administered 2016-05-07: 510 mg via INTRAVENOUS
  Filled 2016-05-07: qty 17

## 2016-05-07 NOTE — Assessment & Plan Note (Signed)
The most likely cause of her anemia is due to chronic blood loss/malabsorption syndrome. We discussed some of the risks, benefits, and alternatives of intravenous iron infusions. The patient is symptomatic from anemia and the iron level is critically low. She tolerated oral iron supplement poorly and desires to achieved higher levels of iron faster for adequate hematopoesis. Some of the side-effects to be expected including risks of infusion reactions, phlebitis, headaches, nausea and fatigue.  The patient is willing to proceed. Patient education material was dispensed.  Goal is to keep ferritin level greater than 50 I recommend she take prenatal vitamin daily for effective erythropoiesis I recommend giving HER-2 doses a week apart  I plan to see her back in 3 months with repeat blood work and iron infusion

## 2016-05-07 NOTE — Progress Notes (Signed)
[place] OFFICE PROGRESS NOTE  TAYLOR, AMANDA, PA-C SUMMARY OF HEMATOLOGIC HISTORY:  Nicole Nicholson is here because of recent ER visit for severe symptomatic anemia and severe fatigue  She was found to have abnormal CBC from recent ER/urgent care visit. She has "anemia all her life". She had multiple pregnancies in the past and was told she was anemic She denies recent chest pain on exertion, but does have shortness of breath on minimal exertion, pre-syncopal episodes, or palpitations. She had not noticed any recent bleeding such as epistaxis, hematuria or hematochezia The patient has occasional over the counter NSAID ingestion. She is not on antiplatelets agents.  She had no prior history or diagnosis of cancer. Her age appropriate screening programs are up-to-date. She complained of pica but eats a variety of diet. She never donated blood or received blood transfusion The patient was prescribed oral iron supplements and she takes 1 daily but tolerated that poorly. She has excessive menorrhagia monthly and was told she has fibroids/ovarian cysts. She had thyroid surgery in the past and complained of chronic fatigue She received multiple doses of IV iron in 2016 & 2017 with improvement of energy level INTERVAL HISTORY: Nicole Nicholson 35 y.o. female returns for further follow-up. She complained of fatigue with pica. She continues to have heavy menstruation. She had minor headaches recently  I have reviewed the past medical history, past surgical history, social history and family history with the patient and they are unchanged from previous note.  ALLERGIES:  is allergic to codeine; darvocet [propoxyphene n-acetaminophen]; percocet [oxycodone-acetaminophen]; and tylenol [acetaminophen].  MEDICATIONS:  Current Outpatient Prescriptions  Medication Sig Dispense Refill  . clonazePAM (KLONOPIN) 1 MG tablet Take 1 mg by mouth at bedtime as needed for anxiety.    .  furosemide (LASIX) 40 MG tablet TK 1 T PO D PRN  1  . hydrOXYzine (ATARAX/VISTARIL) 25 MG tablet TK 1 T PO Q 4-6 H PRF ITCHINESS  1  . ibuprofen (ADVIL,MOTRIN) 800 MG tablet Take 1 tablet (800 mg total) by mouth 3 (three) times daily. 21 tablet 0  . NATURE-THROID 97.5 MG TABS TK 1 AND 1/2 TS PO QD  11  . ondansetron (ZOFRAN) 8 MG tablet Take 1 tablet (8 mg total) by mouth every 8 (eight) hours as needed for nausea or vomiting. 60 tablet 1   No current facility-administered medications for this visit.      REVIEW OF SYSTEMS:   Constitutional: Denies fevers, chills or night sweats Eyes: Denies blurriness of vision Ears, nose, mouth, throat, and face: Denies mucositis or sore throat Respiratory: Denies cough, dyspnea or wheezes Cardiovascular: Denies palpitation, chest discomfort or lower extremity swelling Gastrointestinal:  Denies nausea, heartburn or change in bowel habits Skin: Denies abnormal skin rashes Lymphatics: Denies new lymphadenopathy or easy bruising Neurological:Denies numbness, tingling or new weaknesses Behavioral/Psych: Mood is stable, no new changes  All other systems were reviewed with the patient and are negative.  PHYSICAL EXAMINATION: ECOG PERFORMANCE STATUS: 1 - Symptomatic but completely ambulatory  Vitals:   05/07/16 1358  BP: 135/73  Pulse: 73  Resp: 18  Temp: 97.8 F (36.6 C)   Filed Weights   05/07/16 1358  Weight: 233 lb 8 oz (105.9 kg)    GENERAL:alert, no distress and comfortable SKIN: skin color, texture, turgor are normal, no rashes or significant lesions EYES: normal, Conjunctiva are pink and non-injected, sclera clear Musculoskeletal:no cyanosis of digits and no clubbing  NEURO: alert & oriented x 3 with fluent  speech, no focal motor/sensory deficits  LABORATORY DATA:  I have reviewed the data as listed     Component Value Date/Time   NA 139 02/13/2015 1815   K 3.7 02/13/2015 1815   CL 104 02/13/2015 1815   CO2 28 02/13/2015 1815    GLUCOSE 84 02/13/2015 1815   BUN 12 02/13/2015 1815   CREATININE 0.92 02/13/2015 1815   CALCIUM 9.2 02/13/2015 1815   PROT 7.6 11/13/2014 0720   ALBUMIN 3.5 11/13/2014 0720   AST 21 11/13/2014 0720   ALT 12 (L) 11/13/2014 0720   ALKPHOS 54 11/13/2014 0720   BILITOT 0.1 (L) 11/13/2014 0720   GFRNONAA >60 02/13/2015 1815   GFRAA >60 02/13/2015 1815    No results found for: SPEP, UPEP  Lab Results  Component Value Date   WBC 5.3 04/30/2016   NEUTROABS 3.0 04/30/2016   HGB 9.9 (L) 04/30/2016   HCT 30.6 (L) 04/30/2016   MCV 85.5 04/30/2016   PLT 259 04/30/2016      Chemistry      Component Value Date/Time   NA 139 02/13/2015 1815   K 3.7 02/13/2015 1815   CL 104 02/13/2015 1815   CO2 28 02/13/2015 1815   BUN 12 02/13/2015 1815   CREATININE 0.92 02/13/2015 1815      Component Value Date/Time   CALCIUM 9.2 02/13/2015 1815   ALKPHOS 54 11/13/2014 0720   AST 21 11/13/2014 0720   ALT 12 (L) 11/13/2014 0720   BILITOT 0.1 (L) 11/13/2014 0720       ASSESSMENT & PLAN:  Iron deficiency anemia The most likely cause of her anemia is due to chronic blood loss/malabsorption syndrome. We discussed some of the risks, benefits, and alternatives of intravenous iron infusions. The patient is symptomatic from anemia and the iron level is critically low. She tolerated oral iron supplement poorly and desires to achieved higher levels of iron faster for adequate hematopoesis. Some of the side-effects to be expected including risks of infusion reactions, phlebitis, headaches, nausea and fatigue.  The patient is willing to proceed. Patient education material was dispensed.  Goal is to keep ferritin level greater than 50 I recommend she take prenatal vitamin daily for effective erythropoiesis I recommend giving HER-2 doses a week apart  I plan to see her back in 3 months with repeat blood work and iron infusion  Menorrhagia with regular cycle She declined hysterectomy or hormonal  therapy   Orders Placed This Encounter  Procedures  . CBC with Differential/Platelet    Standing Status:   Future    Standing Expiration Date:   06/11/2017  . Ferritin    Standing Status:   Future    Standing Expiration Date:   06/11/2017  . Iron and TIBC    Standing Status:   Future    Standing Expiration Date:   06/11/2017    All questions were answered. The patient knows to call the clinic with any problems, questions or concerns. No barriers to learning was detected.  I spent 15 minutes counseling the patient face to face. The total time spent in the appointment was 20 minutes and more than 50% was on counseling.     Heath Lark, MD 12/1/20173:59 PM

## 2016-05-07 NOTE — Patient Instructions (Signed)
Ferumoxytol injection What is this medicine? FERUMOXYTOL is an iron complex. Iron is used to make healthy red blood cells, which carry oxygen and nutrients throughout the body. This medicine is used to treat iron deficiency anemia in people with chronic kidney disease. COMMON BRAND NAME(S): Feraheme What should I tell my health care provider before I take this medicine? They need to know if you have any of these conditions: -anemia not caused by low iron levels -high levels of iron in the blood -magnetic resonance imaging (MRI) test scheduled -an unusual or allergic reaction to iron, other medicines, foods, dyes, or preservatives -pregnant or trying to get pregnant -breast-feeding How should I use this medicine? This medicine is for injection into a vein. It is given by a health care professional in a hospital or clinic setting. Talk to your pediatrician regarding the use of this medicine in children. Special care may be needed. What if I miss a dose? It is important not to miss your dose. Call your doctor or health care professional if you are unable to keep an appointment. What may interact with this medicine? This medicine may interact with the following medications: -other iron products What should I watch for while using this medicine? Visit your doctor or healthcare professional regularly. Tell your doctor or healthcare professional if your symptoms do not start to get better or if they get worse. You may need blood work done while you are taking this medicine. You may need to follow a special diet. Talk to your doctor. Foods that contain iron include: whole grains/cereals, dried fruits, beans, or peas, leafy green vegetables, and organ meats (liver, kidney). What side effects may I notice from receiving this medicine? Side effects that you should report to your doctor or health care professional as soon as possible: -allergic reactions like skin rash, itching or hives, swelling of the  face, lips, or tongue -breathing problems -changes in blood pressure -feeling faint or lightheaded, falls -fever or chills -flushing, sweating, or hot feelings -swelling of the ankles or feet Side effects that usually do not require medical attention (report to your doctor or health care professional if they continue or are bothersome): -diarrhea -headache -nausea, vomiting -stomach pain Where should I keep my medicine? This drug is given in a hospital or clinic and will not be stored at home.  2017 Elsevier/Gold Standard (2015-06-26 12:41:49)  

## 2016-05-07 NOTE — Telephone Encounter (Signed)
Appointments scheduled per 12/1 LOS. Patient given AVS report and calendars with future scheduled appointments. Patient requested to have follow up appointment scheduled the following week on 3/16 instead of 3/9.... Called desk nurse to verify okay to change.Marland KitchenMarland KitchenMarland KitchenAppointments adjusted accordingly.

## 2016-05-07 NOTE — Assessment & Plan Note (Signed)
She declined hysterectomy or hormonal therapy

## 2016-05-10 ENCOUNTER — Telehealth: Payer: Self-pay | Admitting: *Deleted

## 2016-05-10 NOTE — Telephone Encounter (Signed)
Per LOS I have scheduled appts and notified the scheduled

## 2016-05-14 ENCOUNTER — Ambulatory Visit (HOSPITAL_BASED_OUTPATIENT_CLINIC_OR_DEPARTMENT_OTHER): Payer: Medicaid Other

## 2016-05-14 VITALS — BP 168/76 | HR 97 | Temp 97.9°F | Resp 18

## 2016-05-14 DIAGNOSIS — N920 Excessive and frequent menstruation with regular cycle: Secondary | ICD-10-CM

## 2016-05-14 DIAGNOSIS — D509 Iron deficiency anemia, unspecified: Secondary | ICD-10-CM

## 2016-05-14 MED ORDER — SODIUM CHLORIDE 0.9 % IV SOLN
Freq: Once | INTRAVENOUS | Status: AC
  Administered 2016-05-14: 11:00:00 via INTRAVENOUS

## 2016-05-14 MED ORDER — FERUMOXYTOL INJECTION 510 MG/17 ML
510.0000 mg | Freq: Once | INTRAVENOUS | Status: AC
  Administered 2016-05-14: 510 mg via INTRAVENOUS
  Filled 2016-05-14: qty 17

## 2016-05-14 NOTE — Progress Notes (Signed)
Patient requested to leave at 1149, two minutes before 30 minute observation was over. Vital signs and patient stable, patient did not complain of any adverse effects.

## 2016-05-14 NOTE — Patient Instructions (Signed)
Ferumoxytol injection What is this medicine? FERUMOXYTOL is an iron complex. Iron is used to make healthy red blood cells, which carry oxygen and nutrients throughout the body. This medicine is used to treat iron deficiency anemia in people with chronic kidney disease. COMMON BRAND NAME(S): Feraheme What should I tell my health care provider before I take this medicine? They need to know if you have any of these conditions: -anemia not caused by low iron levels -high levels of iron in the blood -magnetic resonance imaging (MRI) test scheduled -an unusual or allergic reaction to iron, other medicines, foods, dyes, or preservatives -pregnant or trying to get pregnant -breast-feeding How should I use this medicine? This medicine is for injection into a vein. It is given by a health care professional in a hospital or clinic setting. Talk to your pediatrician regarding the use of this medicine in children. Special care may be needed. What if I miss a dose? It is important not to miss your dose. Call your doctor or health care professional if you are unable to keep an appointment. What may interact with this medicine? This medicine may interact with the following medications: -other iron products What should I watch for while using this medicine? Visit your doctor or healthcare professional regularly. Tell your doctor or healthcare professional if your symptoms do not start to get better or if they get worse. You may need blood work done while you are taking this medicine. You may need to follow a special diet. Talk to your doctor. Foods that contain iron include: whole grains/cereals, dried fruits, beans, or peas, leafy green vegetables, and organ meats (liver, kidney). What side effects may I notice from receiving this medicine? Side effects that you should report to your doctor or health care professional as soon as possible: -allergic reactions like skin rash, itching or hives, swelling of the  face, lips, or tongue -breathing problems -changes in blood pressure -feeling faint or lightheaded, falls -fever or chills -flushing, sweating, or hot feelings -swelling of the ankles or feet Side effects that usually do not require medical attention (report to your doctor or health care professional if they continue or are bothersome): -diarrhea -headache -nausea, vomiting -stomach pain Where should I keep my medicine? This drug is given in a hospital or clinic and will not be stored at home.  2017 Elsevier/Gold Standard (2015-06-26 12:41:49)  

## 2016-06-11 ENCOUNTER — Encounter (INDEPENDENT_AMBULATORY_CARE_PROVIDER_SITE_OTHER): Payer: Self-pay

## 2016-06-11 ENCOUNTER — Ambulatory Visit (INDEPENDENT_AMBULATORY_CARE_PROVIDER_SITE_OTHER): Payer: Medicaid Other | Admitting: Internal Medicine

## 2016-06-11 ENCOUNTER — Encounter: Payer: Self-pay | Admitting: Internal Medicine

## 2016-06-11 VITALS — BP 120/80 | HR 76 | Ht 66.5 in | Wt 227.0 lb

## 2016-06-11 DIAGNOSIS — D509 Iron deficiency anemia, unspecified: Secondary | ICD-10-CM | POA: Diagnosis not present

## 2016-06-11 DIAGNOSIS — R1310 Dysphagia, unspecified: Secondary | ICD-10-CM

## 2016-06-11 DIAGNOSIS — K5900 Constipation, unspecified: Secondary | ICD-10-CM

## 2016-06-11 DIAGNOSIS — R1032 Left lower quadrant pain: Secondary | ICD-10-CM | POA: Diagnosis not present

## 2016-06-11 NOTE — Patient Instructions (Signed)
I will call you in a few weeks to schedule your procedure

## 2016-06-11 NOTE — Progress Notes (Signed)
HISTORY OF PRESENT ILLNESS:  Nicole Nicholson is a 36 y.o. female with past medical history as listed below who is sent by her hematologist Dr. Alvy Bimler regarding iron deficiency anemia. Patient has been evaluated for iron deficiency anemia. Did not tolerate oral iron. Hematology notes and laboratories reviewed. Receiving iron infusions. Has not had the expected bump in hemoglobin. Is not a blood donor. Sedative had heavy periods, but the patient denies such. GI contributors to iron deficiency anemia wanting to be ruled out. Patient has a number of GI complaints. She is a suboptimal historian. In no particular order, she reports sharp intermittent left lower quadrant pain that she has had for years. Cannot identify exacerbating or relieving factors. Does not identify any etiology. Tells me this occurs about once per month. Tells me that she will see her doctor for a "shot" to help the pain. Next, she reports mild intermittent solid food dysphagia. She states this has occurred since her thyroidectomy. She denies heartburn or indigestion. She does complain of bloating and gas. Occasional nausea. There has been 45 pound weight gain since her thyroidectomy. Finally, 17 year history of constipation. May go up to one week without bowel movement. She is not certain whether this relates to her left lower quadrant pain. She does notice hemorrhoids on occasion. No family history of colon cancer. No prior history of GI evaluations. She does take regular NSAIDs for a multitude of aches and pains.  REVIEW OF SYSTEMS:  All non-GI ROS negative except for headaches, itching, fatigue, muscle cramps, ankle swelling, shortness of breath  Past Medical History:  Diagnosis Date  . Anxiety   . Depression   . Hypertension   . Hypothyroidism   . Iron deficiency anemia 11/13/2014  . 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 Pulmonary hypertension     Past Surgical History:  Procedure Laterality Date  . BREAST CYST EXCISION Right    . CESAREAN SECTION    . THYROIDECTOMY    . TUBAL LIGATION      Social History Nicole Nicholson  reports that she has been smoking Cigarettes.  She has been smoking about 0.50 packs per day. She has never used smokeless tobacco. She reports that she drinks alcohol. She reports that she does not use drugs.  family history includes Aneurysm in her maternal aunt; Diabetes in her paternal grandmother; Heart disease in her maternal grandmother; Stroke in her maternal aunt; Thyroid disease in her maternal grandfather.  Allergies  Allergen Reactions  . Codeine Anaphylaxis    Allergic to TYLENOL # 3  . Darvocet [Propoxyphene N-Acetaminophen] Anaphylaxis  . Percocet [Oxycodone-Acetaminophen] Anaphylaxis  . Tylenol [Acetaminophen] Anaphylaxis    Allergic to TYLENOL # 3       PHYSICAL EXAMINATION: Vital signs: BP 120/80 (BP Location: Left Arm, Patient Position: Sitting, Cuff Size: Large)   Pulse 76   Ht 5' 6.5" (1.689 m) Comment: height measured without shoes  Wt 227 lb (103 kg)   LMP 05/12/2016   BMI 36.09 kg/m   Constitutional: Obese, generally well-appearing, no acute distress Psychiatric: alert and oriented x3, cooperative Eyes: extraocular movements intact, anicteric, conjunctiva pink Mouth: oral pharynx moist, no lesions Neck: supple. Thyroidectomy scar well-healed Lymph: no lymphadenopathy Cardiovascular: heart regular rate and rhythm, no murmur Lungs: clear to auscultation bilaterally Abdomen: soft, obese, nontender, nondistended, no obvious ascites, no peritoneal signs, normal bowel sounds, no organomegaly Rectal: Deferred until colonoscopy Extremities: no clubbing cyanosis or lower extremity edema bilaterally Skin: no lesions on visible extremities Neuro:  No focal deficits. Cranial nerves intact  ASSESSMENT:  #1. Iron deficiency anemia. Rule out GI mucosal lesion. She does take NSAIDs #2. Constipation. Sounds functional #3. Dysphagia. Intermittent solid food. Rule out  intrinsic esophageal abnormality #4. Chronic intermittent left lower quadrant pain. Etiology unclear. Question related to constipation #5. Multiple medical problems   PLAN:  #1. Colonoscopy and upper endoscopy to evaluate multiple complaints above and iron deficiency anemia.The nature of the procedure, as well as the risks, benefits, and alternatives were carefully and thoroughly reviewed with the patient. Ample time for discussion and questions allowed. The patient understood, was satisfied, and agreed to proceed. #2. Ongoing management of general medical problems per PCP #3. Ongoing management of anemia per hematology

## 2016-08-09 ENCOUNTER — Telehealth: Payer: Self-pay | Admitting: Internal Medicine

## 2016-08-09 NOTE — Telephone Encounter (Signed)
No other notes needed

## 2016-08-13 ENCOUNTER — Other Ambulatory Visit (HOSPITAL_BASED_OUTPATIENT_CLINIC_OR_DEPARTMENT_OTHER): Payer: Medicaid Other

## 2016-08-13 ENCOUNTER — Other Ambulatory Visit: Payer: Self-pay | Admitting: Hematology and Oncology

## 2016-08-13 DIAGNOSIS — D509 Iron deficiency anemia, unspecified: Secondary | ICD-10-CM | POA: Diagnosis not present

## 2016-08-13 LAB — CBC WITH DIFFERENTIAL/PLATELET
BASO%: 1.2 % (ref 0.0–2.0)
Basophils Absolute: 0.1 10*3/uL (ref 0.0–0.1)
EOS%: 1.7 % (ref 0.0–7.0)
Eosinophils Absolute: 0.1 10*3/uL (ref 0.0–0.5)
HEMATOCRIT: 37.4 % (ref 34.8–46.6)
HGB: 12.4 g/dL (ref 11.6–15.9)
LYMPH#: 2.3 10*3/uL (ref 0.9–3.3)
LYMPH%: 32.1 % (ref 14.0–49.7)
MCH: 29.3 pg (ref 25.1–34.0)
MCHC: 33.3 g/dL (ref 31.5–36.0)
MCV: 88.1 fL (ref 79.5–101.0)
MONO#: 0.6 10*3/uL (ref 0.1–0.9)
MONO%: 8.9 % (ref 0.0–14.0)
NEUT#: 4 10*3/uL (ref 1.5–6.5)
NEUT%: 56.1 % (ref 38.4–76.8)
PLATELETS: 323 10*3/uL (ref 145–400)
RBC: 4.24 10*6/uL (ref 3.70–5.45)
RDW: 14.2 % (ref 11.2–14.5)
WBC: 7.1 10*3/uL (ref 3.9–10.3)

## 2016-08-13 LAB — IRON AND TIBC
%SAT: 11 % — ABNORMAL LOW (ref 21–57)
Iron: 38 ug/dL — ABNORMAL LOW (ref 41–142)
TIBC: 358 ug/dL (ref 236–444)
UIBC: 320 ug/dL (ref 120–384)

## 2016-08-13 LAB — FERRITIN: Ferritin: 19 ng/ml (ref 9–269)

## 2016-08-20 ENCOUNTER — Encounter: Payer: Self-pay | Admitting: Hematology and Oncology

## 2016-08-20 ENCOUNTER — Telehealth: Payer: Self-pay | Admitting: Hematology and Oncology

## 2016-08-20 ENCOUNTER — Ambulatory Visit (HOSPITAL_BASED_OUTPATIENT_CLINIC_OR_DEPARTMENT_OTHER): Payer: Medicaid Other

## 2016-08-20 ENCOUNTER — Ambulatory Visit (HOSPITAL_BASED_OUTPATIENT_CLINIC_OR_DEPARTMENT_OTHER): Payer: Medicaid Other | Admitting: Hematology and Oncology

## 2016-08-20 VITALS — BP 136/81 | HR 82 | Temp 98.7°F | Resp 18 | Wt 235.9 lb

## 2016-08-20 VITALS — BP 148/86 | HR 89 | Resp 20

## 2016-08-20 DIAGNOSIS — D509 Iron deficiency anemia, unspecified: Secondary | ICD-10-CM | POA: Diagnosis present

## 2016-08-20 DIAGNOSIS — R5383 Other fatigue: Secondary | ICD-10-CM | POA: Diagnosis not present

## 2016-08-20 MED ORDER — SODIUM CHLORIDE 0.9 % IJ SOLN
10.0000 mL | INTRAMUSCULAR | Status: DC | PRN
  Filled 2016-08-20: qty 10

## 2016-08-20 MED ORDER — HEPARIN SOD (PORK) LOCK FLUSH 100 UNIT/ML IV SOLN
500.0000 [IU] | Freq: Once | INTRAVENOUS | Status: DC | PRN
  Filled 2016-08-20: qty 5

## 2016-08-20 MED ORDER — SODIUM CHLORIDE 0.9 % IV SOLN
INTRAVENOUS | Status: DC
  Administered 2016-08-20: 14:00:00 via INTRAVENOUS

## 2016-08-20 MED ORDER — SODIUM CHLORIDE 0.9 % IV SOLN
510.0000 mg | Freq: Once | INTRAVENOUS | Status: AC
  Administered 2016-08-20: 510 mg via INTRAVENOUS
  Filled 2016-08-20: qty 17

## 2016-08-20 NOTE — Assessment & Plan Note (Signed)
She has multifactorial cause of fatigue. She is also taking a lot of different medications. I recommend she consult with her primary care doctor for evaluation of her other symptoms.

## 2016-08-20 NOTE — Telephone Encounter (Signed)
Appointments scheduled per 3.16.18 LOS. Patient given AVS report and calendars with future scheduled appointments.  °

## 2016-08-20 NOTE — Progress Notes (Signed)
[facility] OFFICE PROGRESS NOTE  TAYLOR, AMANDA, PA-C SUMMARY OF HEMATOLOGIC HISTORY:  Nicole Nicholson is here because of recent ER visit for severe symptomatic anemia and severe fatigue  She was found to have abnormal CBC from recent ER/urgent care visit. She has "anemia all her life". She had multiple pregnancies in the past and was told she was anemic She denies recent chest pain on exertion, but does have shortness of breath on minimal exertion, pre-syncopal episodes, or palpitations. She had not noticed any recent bleeding such as epistaxis, hematuria or hematochezia The patient has occasional over the counter NSAID ingestion. She is not on antiplatelets agents.  She had no prior history or diagnosis of cancer. Her age appropriate screening programs are up-to-date. She complained of pica but eats a variety of diet. She never donated blood or received blood transfusion The patient was prescribed oral iron supplements and she takes 1 daily but tolerated that poorly. She has excessive menorrhagia monthly and was told she has fibroids/ovarian cysts. She had thyroid surgery in the past and complained of chronic fatigue She received multiple doses of IV iron in 2016 & 2017 with improvement of energy level INTERVAL HISTORY: Nicole Nicholson 36 y.o. female returns for further follow-up. She complained of excessive fatigue again. She also have occasional headaches. She was evaluated by GI service recently with plan for endoscopy evaluation. The patient denies any recent signs or symptoms of bleeding such as spontaneous epistaxis, hematuria or hematochezia.   I have reviewed the past medical history, past surgical history, social history and family history with the patient and they are unchanged from previous note.  ALLERGIES:  is allergic to codeine; darvocet [propoxyphene n-acetaminophen]; percocet [oxycodone-acetaminophen]; tylenol [acetaminophen]; and  mirabegron.  MEDICATIONS:  Current Outpatient Prescriptions  Medication Sig Dispense Refill  . ALPRAZolam (XANAX) 0.5 MG tablet TK 1 T PO  Q 8 H PRF ANXIETY  1  . diclofenac (VOLTAREN) 50 MG EC tablet TK 1 T PO TID PRN  1  . DULoxetine (CYMBALTA) 30 MG capsule TK ONE C PO D  3  . furosemide (LASIX) 40 MG tablet TK 1 T PO D PRN  1  . Homeopathic Products (HYLAFEM) SUPP UNW AND I 1 SUP INTRAVAGINALLY D  0  . hydrOXYzine (ATARAX/VISTARIL) 25 MG tablet TK 1 T PO Q 4-6 H PRF ITCHINESS  1  . loratadine (CLARITIN) 10 MG tablet TK 1 T PO D PRN  1  . NATURE-THROID 97.5 MG TABS TK 1 AND 1/2 TS PO QD  11  . ondansetron (ZOFRAN) 8 MG tablet Take 1 tablet (8 mg total) by mouth every 8 (eight) hours as needed for nausea or vomiting. 60 tablet 1  . promethazine (PHENERGAN) 50 MG tablet TK 1 T PO  Q 6 H PRN  1  . traMADol (ULTRAM) 50 MG tablet TK 1 T PO QID PRN P  0  . Vitamin D, Ergocalciferol, (DRISDOL) 50000 units CAPS capsule Take 50,000 Units by mouth every 7 (seven) days.     Marland Kitchen solifenacin (VESICARE) 5 MG tablet Take 5 mg by mouth.     No current facility-administered medications for this visit.    Facility-Administered Medications Ordered in Other Visits  Medication Dose Route Frequency Provider Last Rate Last Dose  . ferumoxytol (FERAHEME) 510 mg in sodium chloride 0.9 % 100 mL IVPB  510 mg Intravenous Once Heath Lark, MD      . heparin lock flush 100 unit/mL  500 Units Intracatheter Once  PRN Heath Lark, MD      . sodium chloride 0.9 % injection 10 mL  10 mL Intracatheter PRN Heath Lark, MD         REVIEW OF SYSTEMS:   Constitutional: Denies fevers, chills or night sweats Eyes: Denies blurriness of vision Ears, nose, mouth, throat, and face: Denies mucositis or sore throat Respiratory: Denies cough, dyspnea or wheezes Cardiovascular: Denies palpitation, chest discomfort or lower extremity swelling Gastrointestinal:  Denies nausea, heartburn or change in bowel habits Skin: Denies abnormal  skin rashes Lymphatics: Denies new lymphadenopathy or easy bruising Neurological:Denies numbness, tingling or new weaknesses Behavioral/Psych: Mood is stable, no new changes  All other systems were reviewed with the patient and are negative.  PHYSICAL EXAMINATION: ECOG PERFORMANCE STATUS: 1 - Symptomatic but completely ambulatory  Vitals:   08/20/16 1310  BP: 136/81  Pulse: 82  Resp: 18  Temp: 98.7 F (37.1 C)   Filed Weights   08/20/16 1310  Weight: 235 lb 14.4 oz (107 kg)    GENERAL:alert, no distress and comfortable SKIN: skin color, texture, turgor are normal, no rashes or significant lesions EYES: normal, Conjunctiva are pink and non-injected, sclera clear OROPHARYNX:no exudate, no erythema and lips, buccal mucosa, and tongue normal  NECK: supple, thyroid normal size, non-tender, without nodularity LYMPH:  no palpable lymphadenopathy in the cervical, axillary or inguinal LUNGS: clear to auscultation and percussion with normal breathing effort HEART: regular rate & rhythm and no murmurs and no lower extremity edema ABDOMEN:abdomen soft, non-tender and normal bowel sounds Musculoskeletal:no cyanosis of digits and no clubbing  NEURO: alert & oriented x 3 with fluent speech, no focal motor/sensory deficits  LABORATORY DATA:  I have reviewed the data as listed     Component Value Date/Time   NA 139 02/13/2015 1815   K 3.7 02/13/2015 1815   CL 104 02/13/2015 1815   CO2 28 02/13/2015 1815   GLUCOSE 84 02/13/2015 1815   BUN 12 02/13/2015 1815   CREATININE 0.92 02/13/2015 1815   CALCIUM 9.2 02/13/2015 1815   PROT 7.6 11/13/2014 0720   ALBUMIN 3.5 11/13/2014 0720   AST 21 11/13/2014 0720   ALT 12 (L) 11/13/2014 0720   ALKPHOS 54 11/13/2014 0720   BILITOT 0.1 (L) 11/13/2014 0720   GFRNONAA >60 02/13/2015 1815   GFRAA >60 02/13/2015 1815    No results found for: SPEP, UPEP  Lab Results  Component Value Date   WBC 7.1 08/13/2016   NEUTROABS 4.0 08/13/2016   HGB  12.4 08/13/2016   HCT 37.4 08/13/2016   MCV 88.1 08/13/2016   PLT 323 08/13/2016      Chemistry      Component Value Date/Time   NA 139 02/13/2015 1815   K 3.7 02/13/2015 1815   CL 104 02/13/2015 1815   CO2 28 02/13/2015 1815   BUN 12 02/13/2015 1815   CREATININE 0.92 02/13/2015 1815      Component Value Date/Time   CALCIUM 9.2 02/13/2015 1815   ALKPHOS 54 11/13/2014 0720   AST 21 11/13/2014 0720   ALT 12 (L) 11/13/2014 0720   BILITOT 0.1 (L) 11/13/2014 0720       ASSESSMENT & PLAN:  Iron deficiency anemia Even though she is no longer anemic, she is still profoundly iron deficient. She has profound fatigue. The most likely cause of her anemia is due to chronic blood loss/malabsorption syndrome. We discussed some of the risks, benefits, and alternatives of intravenous iron infusions. The patient is symptomatic from anemia  and the iron level is critically low. She tolerated oral iron supplement poorly and desires to achieved higher levels of iron faster for adequate hematopoesis. Some of the side-effects to be expected including risks of infusion reactions, phlebitis, headaches, nausea and fatigue.  The patient is willing to proceed. Patient education material was dispensed.  Goal is to keep ferritin level greater than 50 I plan to see her back again in 3 months with repeat blood work and iron studies. She has appointment recently to see GI service with plan for endoscopy evaluation for other source of bleeding   Other fatigue She has multifactorial cause of fatigue. She is also taking a lot of different medications. I recommend she consult with her primary care doctor for evaluation of her other symptoms.   Orders Placed This Encounter  Procedures  . CBC & Diff and Retic    Standing Status:   Standing    Number of Occurrences:   22    Standing Expiration Date:   08/20/2017  . Ferritin    Standing Status:   Standing    Number of Occurrences:   9    Standing Expiration  Date:   08/20/2017    All questions were answered. The patient knows to call the clinic with any problems, questions or concerns. No barriers to learning was detected.  I spent 15 minutes counseling the patient face to face. The total time spent in the appointment was 20 minutes and more than 50% was on counseling.     Heath Lark, MD 3/16/20182:20 PM

## 2016-08-20 NOTE — Assessment & Plan Note (Signed)
Even though she is no longer anemic, she is still profoundly iron deficient. She has profound fatigue. The most likely cause of her anemia is due to chronic blood loss/malabsorption syndrome. We discussed some of the risks, benefits, and alternatives of intravenous iron infusions. The patient is symptomatic from anemia and the iron level is critically low. She tolerated oral iron supplement poorly and desires to achieved higher levels of iron faster for adequate hematopoesis. Some of the side-effects to be expected including risks of infusion reactions, phlebitis, headaches, nausea and fatigue.  The patient is willing to proceed. Patient education material was dispensed.  Goal is to keep ferritin level greater than 50 I plan to see her back again in 3 months with repeat blood work and iron studies. She has appointment recently to see GI service with plan for endoscopy evaluation for other source of bleeding

## 2016-10-28 ENCOUNTER — Encounter: Payer: Medicaid Other | Admitting: Internal Medicine

## 2016-11-04 ENCOUNTER — Encounter (HOSPITAL_BASED_OUTPATIENT_CLINIC_OR_DEPARTMENT_OTHER): Payer: Self-pay | Admitting: *Deleted

## 2016-11-04 ENCOUNTER — Emergency Department (HOSPITAL_BASED_OUTPATIENT_CLINIC_OR_DEPARTMENT_OTHER)
Admission: EM | Admit: 2016-11-04 | Discharge: 2016-11-04 | Disposition: A | Discharge status: Home / Self Care | Payer: Medicaid Other | Attending: Emergency Medicine | Admitting: Emergency Medicine

## 2016-11-04 DIAGNOSIS — Z5321 Procedure and treatment not carried out due to patient leaving prior to being seen by health care provider: Secondary | ICD-10-CM | POA: Diagnosis not present

## 2016-11-04 DIAGNOSIS — N898 Other specified noninflammatory disorders of vagina: Secondary | ICD-10-CM | POA: Diagnosis not present

## 2016-11-04 LAB — URINALYSIS, ROUTINE W REFLEX MICROSCOPIC
BILIRUBIN URINE: NEGATIVE
Glucose, UA: NEGATIVE mg/dL
HGB URINE DIPSTICK: NEGATIVE
Ketones, ur: NEGATIVE mg/dL
Leukocytes, UA: NEGATIVE
Nitrite: NEGATIVE
Protein, ur: NEGATIVE mg/dL
SPECIFIC GRAVITY, URINE: 1.013 (ref 1.005–1.030)
pH: 7.5 (ref 5.0–8.0)

## 2016-11-04 LAB — PREGNANCY, URINE: PREG TEST UR: NEGATIVE

## 2016-11-04 NOTE — ED Notes (Signed)
Pt left prior to being seen due to wait to see MD. Pt ambulatory without difficulty.

## 2016-11-04 NOTE — ED Triage Notes (Signed)
Pt with 4-5 days of vaginal burning and irritation as well as vaginal DC ( brown in color )

## 2016-11-04 NOTE — ED Notes (Signed)
Pt upset about the wait time. Explained delay to pt. Pt remains hostile and argumentative.

## 2016-11-05 ENCOUNTER — Other Ambulatory Visit (HOSPITAL_BASED_OUTPATIENT_CLINIC_OR_DEPARTMENT_OTHER): Payer: Medicaid Other

## 2016-11-05 ENCOUNTER — Telehealth: Payer: Self-pay | Admitting: Medical Oncology

## 2016-11-05 ENCOUNTER — Other Ambulatory Visit: Payer: Self-pay | Admitting: Hematology and Oncology

## 2016-11-05 DIAGNOSIS — D509 Iron deficiency anemia, unspecified: Secondary | ICD-10-CM

## 2016-11-05 LAB — CBC & DIFF AND RETIC
BASO%: 0.5 % (ref 0.0–2.0)
Basophils Absolute: 0 10*3/uL (ref 0.0–0.1)
EOS ABS: 0.1 10*3/uL (ref 0.0–0.5)
EOS%: 1.8 % (ref 0.0–7.0)
HCT: 34.6 % — ABNORMAL LOW (ref 34.8–46.6)
HGB: 11.5 g/dL — ABNORMAL LOW (ref 11.6–15.9)
IMMATURE RETIC FRACT: 8.9 % (ref 1.60–10.00)
LYMPH%: 31.7 % (ref 14.0–49.7)
MCH: 29.2 pg (ref 25.1–34.0)
MCHC: 33.2 g/dL (ref 31.5–36.0)
MCV: 87.8 fL (ref 79.5–101.0)
MONO#: 0.6 10*3/uL (ref 0.1–0.9)
MONO%: 9.3 % (ref 0.0–14.0)
NEUT%: 56.7 % (ref 38.4–76.8)
NEUTROS ABS: 3.6 10*3/uL (ref 1.5–6.5)
NRBC: 0 % (ref 0–0)
Platelets: 350 10*3/uL (ref 145–400)
RBC: 3.94 10*6/uL (ref 3.70–5.45)
RDW: 13.6 % (ref 11.2–14.5)
RETIC CT ABS: 59.49 10*3/uL (ref 33.70–90.70)
Retic %: 1.51 % (ref 0.70–2.10)
WBC: 6.3 10*3/uL (ref 3.9–10.3)
lymph#: 2 10*3/uL (ref 0.9–3.3)

## 2016-11-05 LAB — FERRITIN: FERRITIN: 15 ng/mL (ref 9–269)

## 2016-11-05 NOTE — Telephone Encounter (Signed)
From: Heath Lark, MD  Sent: 11/05/2016 11:46 AM  To: Patton Salles, RN  Subject: labs abnormal                   Pls remind her to keep appt next week for IV iron This message left on pts voice mail.

## 2016-11-08 ENCOUNTER — Telehealth: Payer: Self-pay | Admitting: *Deleted

## 2016-11-08 ENCOUNTER — Ambulatory Visit (AMBULATORY_SURGERY_CENTER): Payer: Self-pay | Admitting: *Deleted

## 2016-11-08 VITALS — Ht 67.0 in | Wt 229.8 lb

## 2016-11-08 DIAGNOSIS — R1310 Dysphagia, unspecified: Secondary | ICD-10-CM

## 2016-11-08 DIAGNOSIS — D509 Iron deficiency anemia, unspecified: Secondary | ICD-10-CM

## 2016-11-08 MED ORDER — NA SULFATE-K SULFATE-MG SULF 17.5-3.13-1.6 GM/177ML PO SOLN: 1.0000 | Freq: Once | ORAL | 0 refills | Status: AC

## 2016-11-08 NOTE — Progress Notes (Signed)
No egg or soy allergy known to patient  No issues with past sedation with any surgeries  or procedures, no intubation problems  No diet pills per patient No home 02 use per patient  No blood thinners per patient  Pt denies issues with constipation  No A fib or A flutter  EMMI video sent to pt's e mail  Pt is scheduled for a colon/ egd - pt would not initial the egd dilation on the consent- she states she does not want her esophagus stretched despite having dysphagia

## 2016-11-08 NOTE — Telephone Encounter (Signed)
Dr Henrene Pastor,  I saw Nicole Nicholson in [place] this morning.  She is scheduled for a double 6-13 Wednesday with you for IDA and dysphagia.  After she read her consent form she would not initial the dilation section for her EGD.  She said it said bleeding or perforation was an increased risk and that was a risk she did not want to take but she did consent to the two procedures.  I just wanted to give you a heads up on this since dysphagia is a concern she discussed in [facility] today with solids.    Thanks, Lelan Pons PV

## 2016-11-08 NOTE — Telephone Encounter (Signed)
Okay, thanks for the notification.

## 2016-11-09 IMAGING — CT CT HEAD W/O CM
1 series · 16 of 30 positions shown, 20 images · non-contrast
Comparison: A prior MRI of the brain on 11/15/2013 cannot be
retrieved. The report is reviewed.

CLINICAL DATA: Headache, dizziness, blurred vision and fever.

EXAM:
CT HEAD WITHOUT CONTRAST
TECHNIQUE: Contiguous axial images were obtained from the base of the skull
through the vertex without intravenous contrast.

[Series 2: head 4.8 h37s · axial · 0.40mm/px · z∈[-42,+110]mm · 16 of 36 slices shown, 20 images]
[im 2/36  brain]
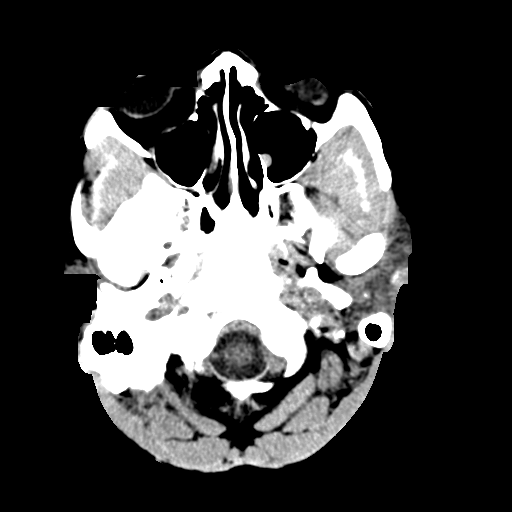
[im 2/36  bone]
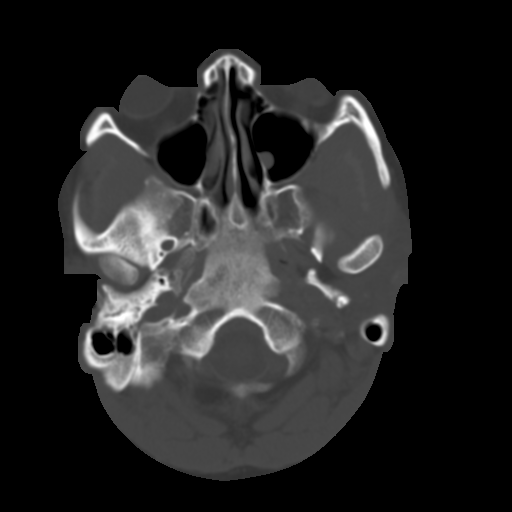
[im 4/36  brain]
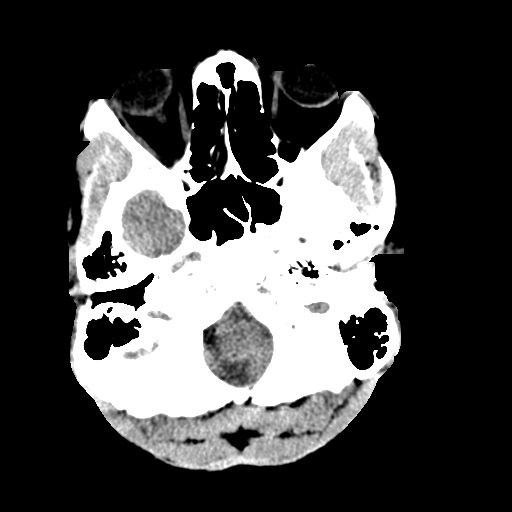
[im 7/36  brain]
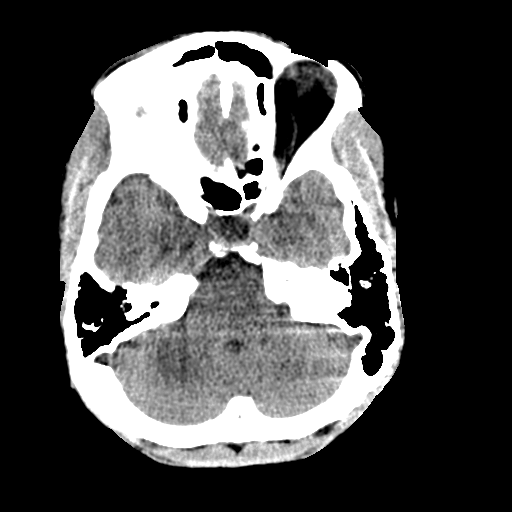
[im 9/36  brain]
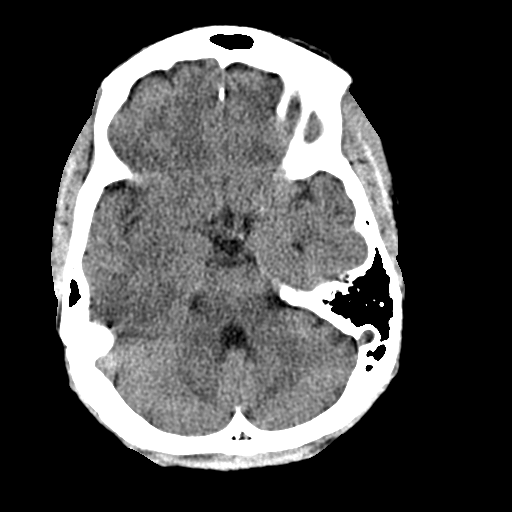
[im 10/36  brain]
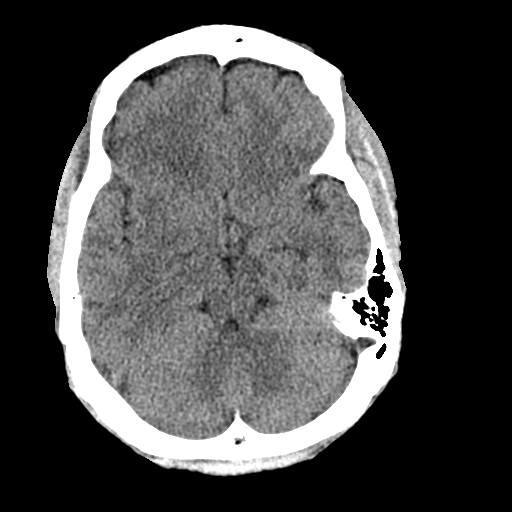
[im 10/36  bone]
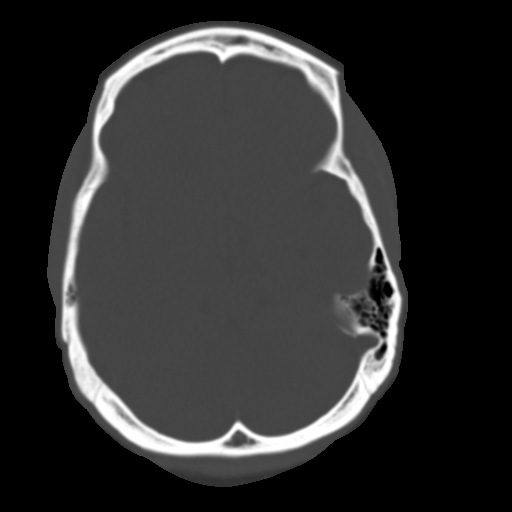
[im 13/36  brain]
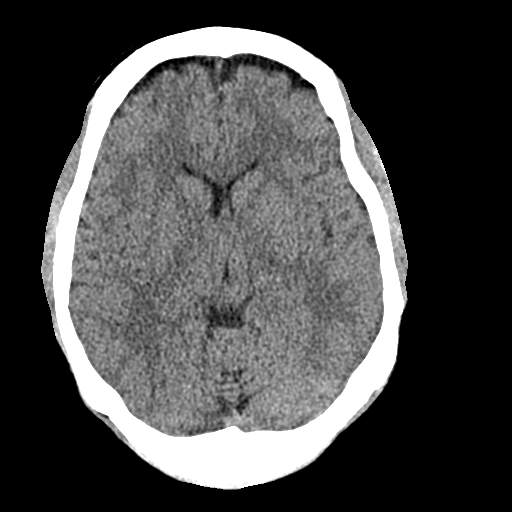
[im 15/36  brain]
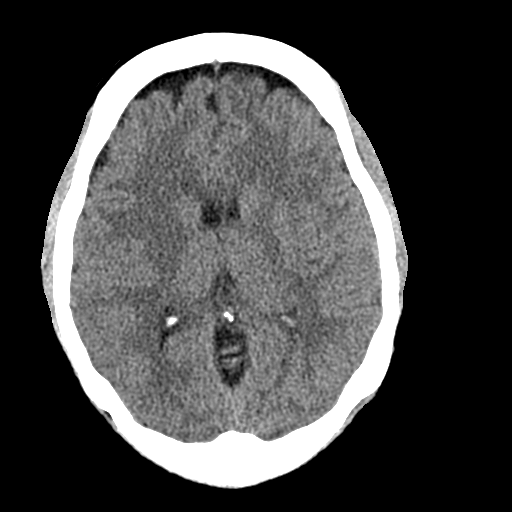
[im 17/36  brain]
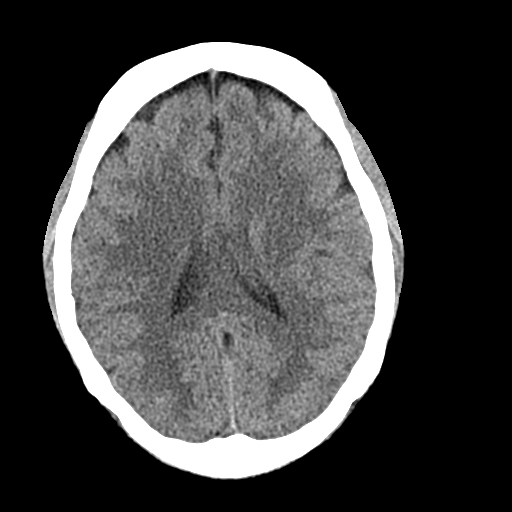
[im 19/36  brain]
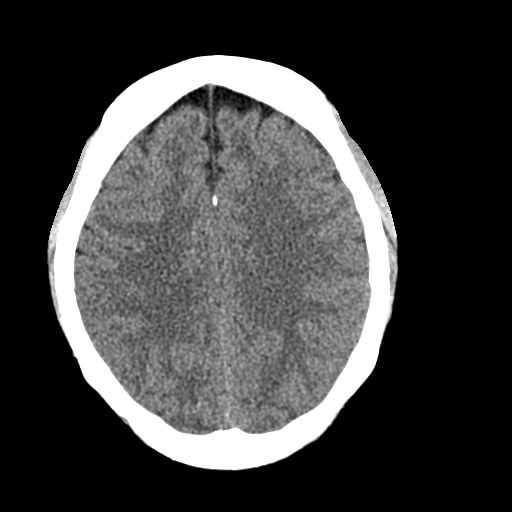
[im 19/36  bone]
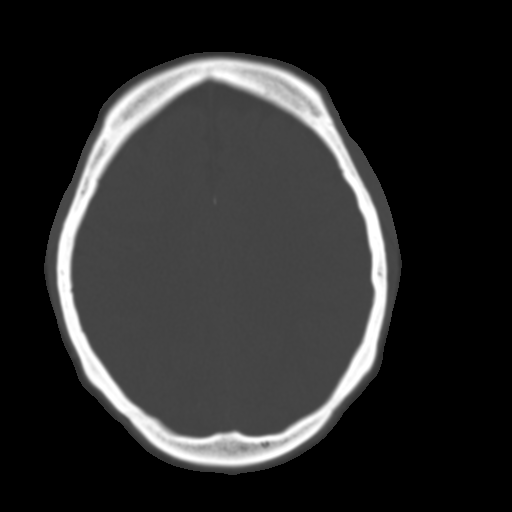
[im 21/36  brain]
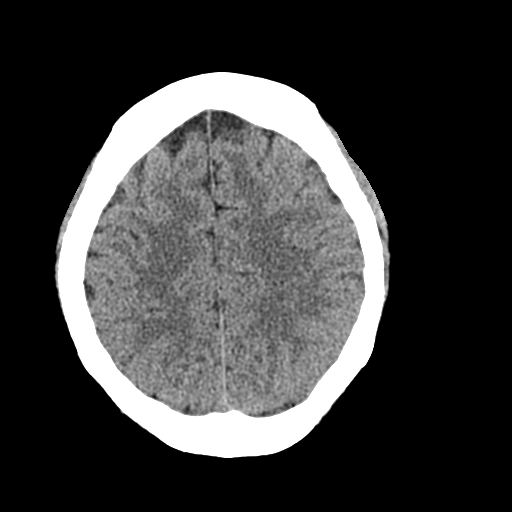
[im 23/36  brain]
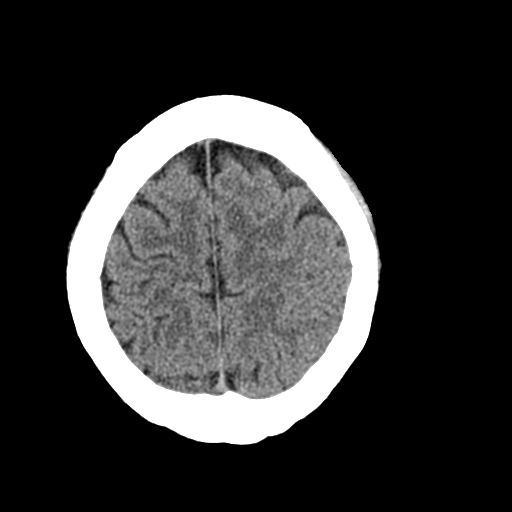
[im 26/36  brain]
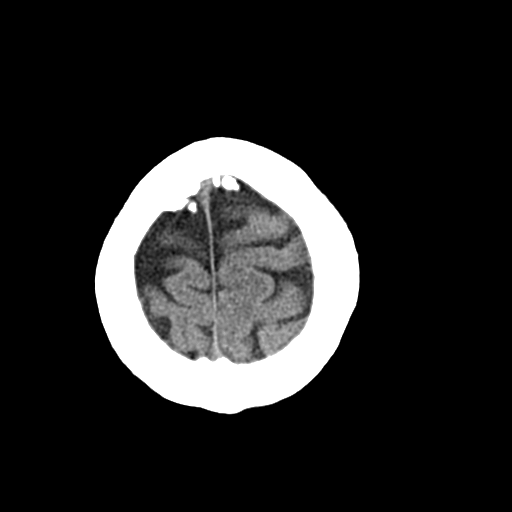
[im 27/36  brain]
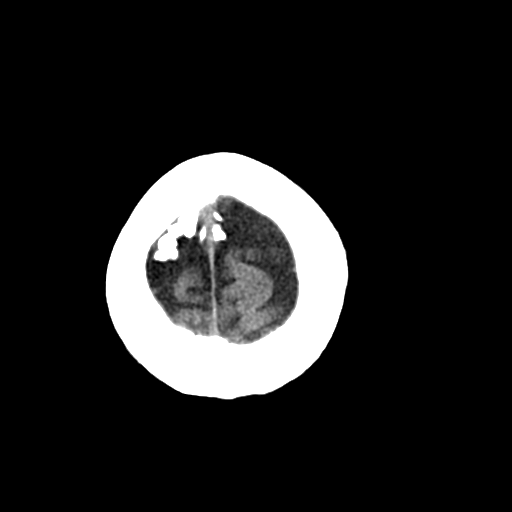
[im 27/36  bone]
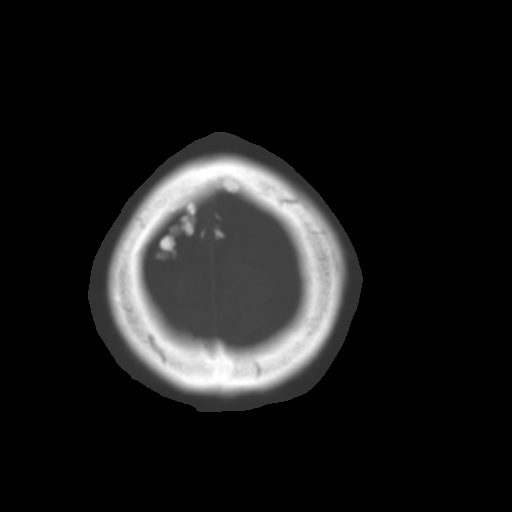
[im 29/36  brain]
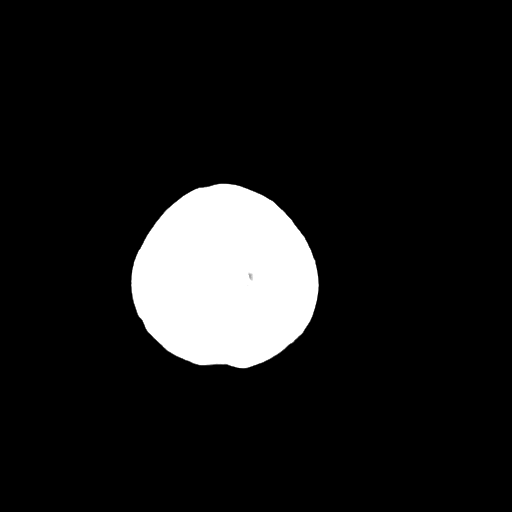
[im 32/36  brain]
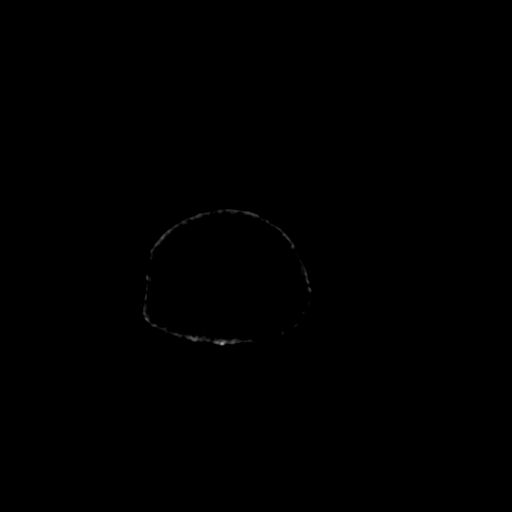
[im 34/36  brain]
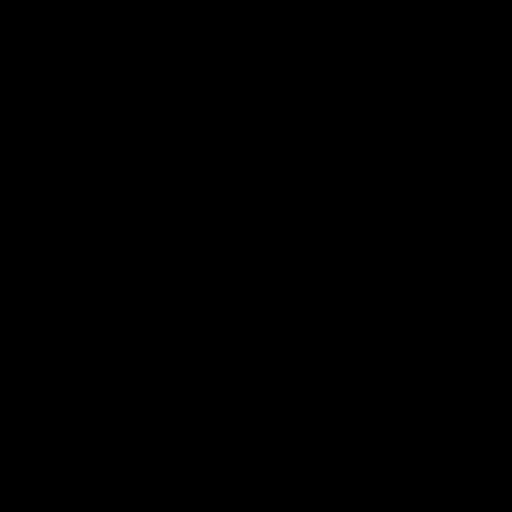

[16 of 30 positions shown; findings below may reference images not displayed]

FINDINGS: The brain demonstrates no evidence of hemorrhage, infarction, edema,
mass effect, extra-axial fluid collection, hydrocephalus or mass
lesion. Findings similar to that described on the MRI report of an
enlarged sella demonstrating low attenuation. Findings are
suggestive of some type of cystic abnormality. The skull is
unremarkable.
IMPRESSION: No acute findings. Similar to findings reported on a prior MRI, the
sella is enlarged and contains a cystic abnormality. Correlation
suggested with any prior workup as a dedicated pre and post contrast
MRI of the pituitary region was recommended previously.

## 2016-11-09 IMAGING — DX DG CHEST 2V
2 series · 2 of 2 positions shown · non-contrast
Comparison: None.

CLINICAL DATA: Cough and headache, 2 days duration.

EXAM:
CHEST  2 VIEW

[chest pa]
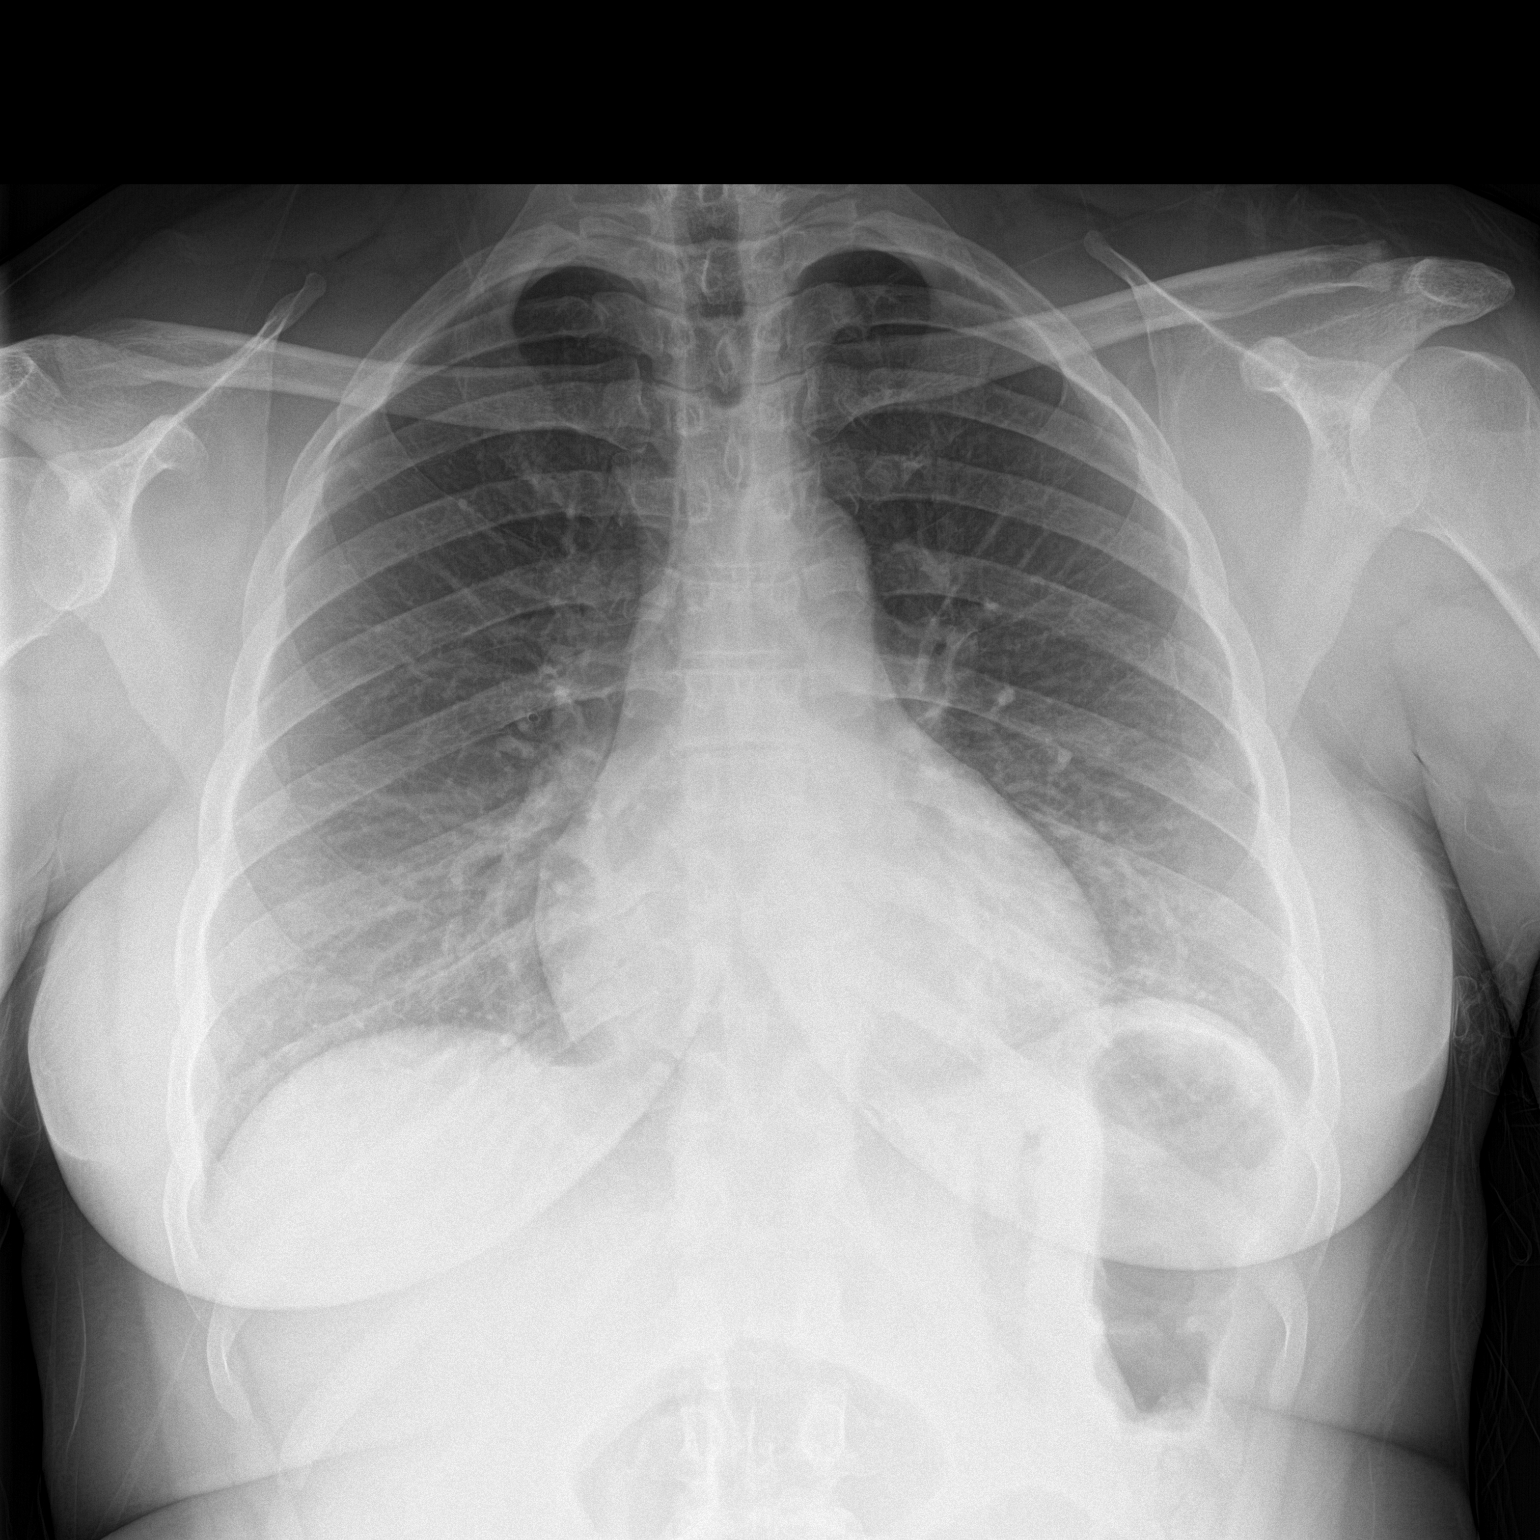

[chest lat]
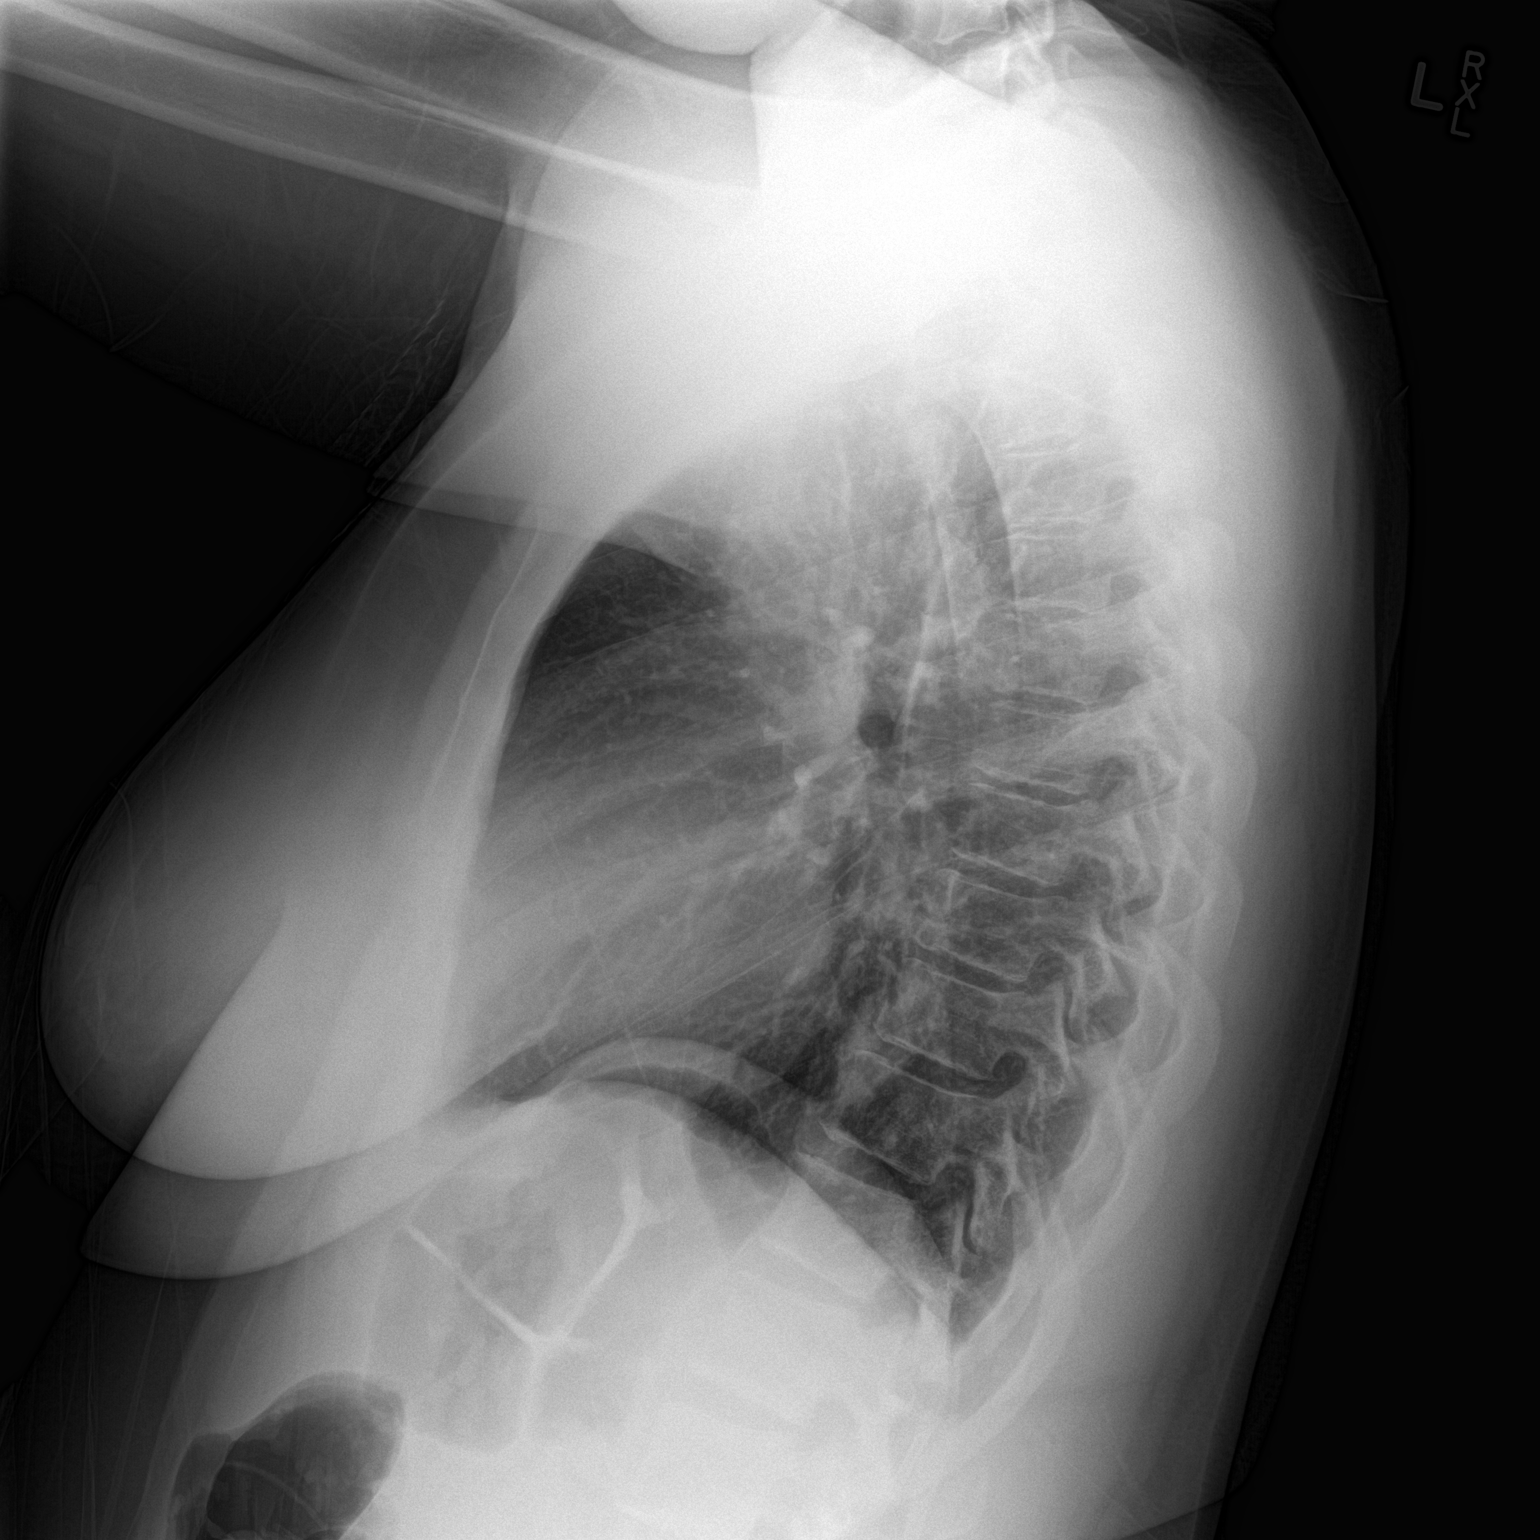

[2 of 2 positions shown; findings below may reference images not displayed]

FINDINGS: The heart size and mediastinal contours are within normal limits.
Both lungs are clear. The visualized skeletal structures are
unremarkable.
IMPRESSION: No active cardiopulmonary disease.

## 2016-11-12 ENCOUNTER — Ambulatory Visit (HOSPITAL_BASED_OUTPATIENT_CLINIC_OR_DEPARTMENT_OTHER): Payer: Medicaid Other

## 2016-11-12 ENCOUNTER — Ambulatory Visit (HOSPITAL_BASED_OUTPATIENT_CLINIC_OR_DEPARTMENT_OTHER): Payer: Medicaid Other | Admitting: Hematology and Oncology

## 2016-11-12 ENCOUNTER — Telehealth: Payer: Self-pay | Admitting: Hematology and Oncology

## 2016-11-12 ENCOUNTER — Encounter: Payer: Self-pay | Admitting: Hematology and Oncology

## 2016-11-12 VITALS — BP 118/75 | HR 72 | Temp 99.0°F | Resp 16

## 2016-11-12 DIAGNOSIS — D500 Iron deficiency anemia secondary to blood loss (chronic): Secondary | ICD-10-CM

## 2016-11-12 DIAGNOSIS — N920 Excessive and frequent menstruation with regular cycle: Secondary | ICD-10-CM

## 2016-11-12 DIAGNOSIS — D509 Iron deficiency anemia, unspecified: Secondary | ICD-10-CM

## 2016-11-12 MED ORDER — SODIUM CHLORIDE 0.9 % IV SOLN
510.0000 mg | Freq: Once | INTRAVENOUS | Status: AC
  Administered 2016-11-12: 510 mg via INTRAVENOUS
  Filled 2016-11-12: qty 17

## 2016-11-12 NOTE — Telephone Encounter (Signed)
Scheduled appt per 6/8 los - Patient did not want AVS or calender .

## 2016-11-12 NOTE — Patient Instructions (Signed)
Ferumoxytol injection What is this medicine? FERUMOXYTOL is an iron complex. Iron is used to make healthy red blood cells, which carry oxygen and nutrients throughout the body. This medicine is used to treat iron deficiency anemia in people with chronic kidney disease. COMMON BRAND NAME(S): Feraheme What should I tell my health care provider before I take this medicine? They need to know if you have any of these conditions: -anemia not caused by low iron levels -high levels of iron in the blood -magnetic resonance imaging (MRI) test scheduled -an unusual or allergic reaction to iron, other medicines, foods, dyes, or preservatives -pregnant or trying to get pregnant -breast-feeding How should I use this medicine? This medicine is for injection into a vein. It is given by a health care professional in a hospital or clinic setting. Talk to your pediatrician regarding the use of this medicine in children. Special care may be needed. What if I miss a dose? It is important not to miss your dose. Call your doctor or health care professional if you are unable to keep an appointment. What may interact with this medicine? This medicine may interact with the following medications: -other iron products What should I watch for while using this medicine? Visit your doctor or healthcare professional regularly. Tell your doctor or healthcare professional if your symptoms do not start to get better or if they get worse. You may need blood work done while you are taking this medicine. You may need to follow a special diet. Talk to your doctor. Foods that contain iron include: whole grains/cereals, dried fruits, beans, or peas, leafy green vegetables, and organ meats (liver, kidney). What side effects may I notice from receiving this medicine? Side effects that you should report to your doctor or health care professional as soon as possible: -allergic reactions like skin rash, itching or hives, swelling of the  face, lips, or tongue -breathing problems -changes in blood pressure -feeling faint or lightheaded, falls -fever or chills -flushing, sweating, or hot feelings -swelling of the ankles or feet Side effects that usually do not require medical attention (report to your doctor or health care professional if they continue or are bothersome): -diarrhea -headache -nausea, vomiting -stomach pain Where should I keep my medicine? This drug is given in a hospital or clinic and will not be stored at home.  2017 Elsevier/Gold Standard (2015-06-26 12:41:49)  

## 2016-11-12 NOTE — Assessment & Plan Note (Signed)
Even though she is no longer anemic, she is still profoundly iron deficient. She has profound fatigue. The most likely cause of her anemia is due to chronic blood loss/malabsorption syndrome. We discussed some of the risks, benefits, and alternatives of intravenous iron infusions. The patient is symptomatic from anemia and the iron level is critically low. She tolerated oral iron supplement poorly and desires to achieved higher levels of iron faster for adequate hematopoesis. Some of the side-effects to be expected including risks of infusion reactions, phlebitis, headaches, nausea and fatigue.  The patient is willing to proceed. Patient education material was dispensed.  Goal is to keep ferritin level greater than 50 I plan to see her back again in 3 months with repeat blood work and iron studies. She has appointment recently to see GI service with plan for endoscopy evaluation for other source of bleeding I recommend the patient to avoid Voltaren due to risk of GI bleed. She is instructed to take folic acid daily

## 2016-11-12 NOTE — Progress Notes (Signed)
[facility] OFFICE PROGRESS NOTE  Windell Hummingbird, PA-C SUMMARY OF HEMATOLOGIC HISTORY:  Nicole Nicholson is here because of recent ER visit for severe symptomatic anemia and severe fatigue  She was found to have abnormal CBC from recent ER/urgent care visit. She has "anemia all her life". She had multiple pregnancies in the past and was told she was anemic She denies recent chest pain on exertion, but does have shortness of breath on minimal exertion, pre-syncopal episodes, or palpitations. She had not noticed any recent bleeding such as epistaxis, hematuria or hematochezia The patient has occasional over the counter NSAID ingestion. She is not on antiplatelets agents.  She had no prior history or diagnosis of cancer. Her age appropriate screening programs are up-to-date. She complained of pica but eats a variety of diet. She never donated blood or received blood transfusion The patient was prescribed oral iron supplements and she takes 1 daily but tolerated that poorly. She has excessive menorrhagia monthly and was told she has fibroids/ovarian cysts. She had thyroid surgery in the past and complained of chronic fatigue She received multiple doses of IV iron in 2016 & 2017 and March 2018 with improvement of energy level INTERVAL HISTORY: Nicole Nicholson 36 y.o. female returns for further follow-up. She complained of excessive fatigue again. She also have occasional headaches. She was evaluated by GI service recently with plan for endoscopy evaluation. The patient denies any recent signs or symptoms of bleeding such as spontaneous epistaxis, hematuria or hematochezia. She tolerated iron infusion without side effects. I have reviewed the past medical history, past surgical history, social history and family history with the patient and they are unchanged from previous note.  ALLERGIES:  is allergic to codeine; darvocet [propoxyphene n-acetaminophen]; percocet  [oxycodone-acetaminophen]; tylenol [acetaminophen]; and mirabegron.  MEDICATIONS:  Current Outpatient Prescriptions  Medication Sig Dispense Refill  . ALPRAZolam (XANAX) 0.5 MG tablet TK 1 T PO  Q 8 H PRF ANXIETY  1  . diclofenac (VOLTAREN) 50 MG EC tablet TK 1 T PO TID PRN  1  . DULoxetine (CYMBALTA) 30 MG capsule TK ONE C PO D  3  . furosemide (LASIX) 40 MG tablet TK 1 T PO  every other day  1  . hydrOXYzine (ATARAX/VISTARIL) 25 MG tablet TK 1 T PO Q 4-6 H PRF ITCHINESS  1  . loratadine (CLARITIN) 10 MG tablet TK 1 T PO D daily  1  . NATURE-THROID 97.5 MG TABS TK 1 AND 1/2 TS PO QD  11  . ondansetron (ZOFRAN-ODT) 8 MG disintegrating tablet DIS 1 T ON THE TONGUE Q 8 H PRN  2  . promethazine (PHENERGAN) 50 MG tablet TK 1 T PO  Q 6 H PRN  1  . traMADol (ULTRAM) 50 MG tablet TK 1 T PO QID PRN P  0  . Vitamin D, Ergocalciferol, (DRISDOL) 50000 units CAPS capsule Take 50,000 Units by mouth every 7 (seven) days.      No current facility-administered medications for this visit.      REVIEW OF SYSTEMS:   Constitutional: Denies fevers, chills or night sweats Eyes: Denies blurriness of vision Ears, nose, mouth, throat, and face: Denies mucositis or sore throat Respiratory: Denies cough, dyspnea or wheezes Cardiovascular: Denies palpitation, chest discomfort or lower extremity swelling Gastrointestinal:  Denies nausea, heartburn or change in bowel habits Skin: Denies abnormal skin rashes Lymphatics: Denies new lymphadenopathy or easy bruising Neurological:Denies numbness, tingling or new weaknesses Behavioral/Psych: Mood is stable, no new changes  All other systems were reviewed with the patient and are negative.  PHYSICAL EXAMINATION: ECOG PERFORMANCE STATUS: 1 - Symptomatic but completely ambulatory  Vitals:   11/12/16 1037  BP: 124/78  Pulse: 81  Resp: 20  Temp: 98.8 F (37.1 C)   Filed Weights   11/12/16 1037  Weight: 229 lb 3.2 oz (104 kg)    GENERAL:alert, no distress and  comfortable SKIN: skin color, texture, turgor are normal, no rashes or significant lesions EYES: normal, Conjunctiva are pink and non-injected, sclera clear Musculoskeletal:no cyanosis of digits and no clubbing  NEURO: alert & oriented x 3 with fluent speech, no focal motor/sensory deficits  LABORATORY DATA:  I have reviewed the data as listed     Component Value Date/Time   NA 139 02/13/2015 1815   K 3.7 02/13/2015 1815   CL 104 02/13/2015 1815   CO2 28 02/13/2015 1815   GLUCOSE 84 02/13/2015 1815   BUN 12 02/13/2015 1815   CREATININE 0.92 02/13/2015 1815   CALCIUM 9.2 02/13/2015 1815   PROT 7.6 11/13/2014 0720   ALBUMIN 3.5 11/13/2014 0720   AST 21 11/13/2014 0720   ALT 12 (L) 11/13/2014 0720   ALKPHOS 54 11/13/2014 0720   BILITOT 0.1 (L) 11/13/2014 0720   GFRNONAA >60 02/13/2015 1815   GFRAA >60 02/13/2015 1815    No results found for: SPEP, UPEP  Lab Results  Component Value Date   WBC 6.3 11/05/2016   NEUTROABS 3.6 11/05/2016   HGB 11.5 (L) 11/05/2016   HCT 34.6 (L) 11/05/2016   MCV 87.8 11/05/2016   PLT 350 11/05/2016      Chemistry      Component Value Date/Time   NA 139 02/13/2015 1815   K 3.7 02/13/2015 1815   CL 104 02/13/2015 1815   CO2 28 02/13/2015 1815   BUN 12 02/13/2015 1815   CREATININE 0.92 02/13/2015 1815      Component Value Date/Time   CALCIUM 9.2 02/13/2015 1815   ALKPHOS 54 11/13/2014 0720   AST 21 11/13/2014 0720   ALT 12 (L) 11/13/2014 0720   BILITOT 0.1 (L) 11/13/2014 0720      ASSESSMENT & PLAN:  Iron deficiency anemia Even though she is no longer anemic, she is still profoundly iron deficient. She has profound fatigue. The most likely cause of her anemia is due to chronic blood loss/malabsorption syndrome. We discussed some of the risks, benefits, and alternatives of intravenous iron infusions. The patient is symptomatic from anemia and the iron level is critically low. She tolerated oral iron supplement poorly and desires  to achieved higher levels of iron faster for adequate hematopoesis. Some of the side-effects to be expected including risks of infusion reactions, phlebitis, headaches, nausea and fatigue.  The patient is willing to proceed. Patient education material was dispensed.  Goal is to keep ferritin level greater than 50 I plan to see her back again in 3 months with repeat blood work and iron studies. She has appointment recently to see GI service with plan for endoscopy evaluation for other source of bleeding I recommend the patient to avoid Voltaren due to risk of GI bleed. She is instructed to take folic acid daily    No orders of the defined types were placed in this encounter.   All questions were answered. The patient knows to call the clinic with any problems, questions or concerns. No barriers to learning was detected.  I spent 10 minutes counseling the patient face to face. The total  time spent in the appointment was 15 minutes and more than 50% was on counseling.     Heath Lark, MD 6/8/201810:43 AM

## 2016-11-17 ENCOUNTER — Ambulatory Visit (AMBULATORY_SURGERY_CENTER): Payer: Medicaid Other | Admitting: Internal Medicine

## 2016-11-17 ENCOUNTER — Encounter: Payer: Self-pay | Admitting: Internal Medicine

## 2016-11-17 VITALS — BP 131/66 | HR 66 | Temp 98.0°F | Resp 10 | Ht 67.0 in | Wt 229.8 lb

## 2016-11-17 DIAGNOSIS — R1319 Other dysphagia: Secondary | ICD-10-CM

## 2016-11-17 DIAGNOSIS — R1310 Dysphagia, unspecified: Secondary | ICD-10-CM

## 2016-11-17 DIAGNOSIS — K635 Polyp of colon: Secondary | ICD-10-CM | POA: Diagnosis not present

## 2016-11-17 DIAGNOSIS — D509 Iron deficiency anemia, unspecified: Secondary | ICD-10-CM | POA: Diagnosis not present

## 2016-11-17 DIAGNOSIS — D125 Benign neoplasm of sigmoid colon: Secondary | ICD-10-CM | POA: Diagnosis not present

## 2016-11-17 MED ORDER — SODIUM CHLORIDE 0.9 % IV SOLN
500.0000 mL | INTRAVENOUS | Status: DC
Start: 2016-11-17 — End: 2020-05-21

## 2016-11-17 NOTE — Progress Notes (Signed)
Called to room to assist during endoscopic procedure.  Patient ID and intended procedure confirmed with present staff. Received instructions for my participation in the procedure from the performing physician.  

## 2016-11-17 NOTE — Patient Instructions (Signed)
Discharge instructions given. Handouts on polyps and hemorrhoids. Resume previous medications. YOU HAD AN ENDOSCOPIC PROCEDURE TODAY AT THE [facility]:   Refer to the procedure report that was given to you for any specific questions about what was found during the examination.  If the procedure report does not answer your questions, please call your gastroenterologist to clarify.  If you requested that your care partner not be given the details of your procedure findings, then the procedure report has been included in a sealed envelope for you to review at your convenience later.  YOU SHOULD EXPECT: Some feelings of bloating in the abdomen. Passage of more gas than usual.  Walking can help get rid of the air that was put into your GI tract during the procedure and reduce the bloating. If you had a lower endoscopy (such as a colonoscopy or flexible sigmoidoscopy) you may notice spotting of blood in your stool or on the toilet paper. If you underwent a bowel prep for your procedure, you may not have a normal bowel movement for a few days.  Please Note:  You might notice some irritation and congestion in your nose or some drainage.  This is from the oxygen used during your procedure.  There is no need for concern and it should clear up in a day or so.  SYMPTOMS TO REPORT IMMEDIATELY:   Following lower endoscopy (colonoscopy or flexible sigmoidoscopy):  Excessive amounts of blood in the stool  Significant tenderness or worsening of abdominal pains  Swelling of the abdomen that is new, acute  Fever of 100F or higher   Following upper endoscopy (EGD)  Vomiting of blood or coffee ground material  New chest pain or pain under the shoulder blades  Painful or persistently difficult swallowing  New shortness of breath  Fever of 100F or higher  Black, tarry-looking stools  For urgent or emergent issues, a gastroenterologist can be reached at any hour by calling [phone]  [phone].   DIET:  We do recommend a small meal at first, but then you may proceed to your regular diet.  Drink plenty of fluids but you should avoid alcoholic beverages for 24 hours.  ACTIVITY:  You should plan to take it easy for the rest of today and you should NOT DRIVE or use heavy machinery until tomorrow (because of the sedation medicines used during the test).    FOLLOW UP: Our staff will call the number listed on your records the next business day following your procedure to check on you and address any questions or concerns that you may have regarding the information given to you following your procedure. If we do not reach you, we will leave a message.  However, if you are feeling well and you are not experiencing any problems, there is no need to return our call.  We will assume that you have returned to your regular daily activities without incident.  If any biopsies were taken you will be contacted by phone or by letter within the next 1-3 weeks.  Please call us at [phone] if you have not heard about the biopsies in 3 weeks.    SIGNATURES/CONFIDENTIALITY: You and/or your care partner have signed paperwork which will be entered into your electronic medical record.  These signatures attest to the fact that that the information above on your After Visit Summary has been reviewed and is understood.  Full responsibility of the confidentiality of this discharge information lies with you and/or your care-partner.  

## 2016-11-17 NOTE — Op Note (Signed)
[facility] Patient Name: Nicole Nicholson Procedure Date: 11/17/2016 11:10 AM MRN: [id number] Endoscopist: Docia Chuck. Henrene Pastor , MD Age: 36 Referring MD:  Date of Birth: 05/10/1981 Gender: Female Account #: [id number] Procedure:                Upper GI endoscopy Indications:              Iron deficiency anemia, Dysphagia Medicines:                Monitored Anesthesia Care Procedure:                Pre-Anesthesia Assessment:                           - Prior to the procedure, a History and Physical                            was performed, and patient medications and                            allergies were reviewed. The patient's tolerance of                            previous anesthesia was also reviewed. The risks                            and benefits of the procedure and the sedation                            options and risks were discussed with the patient.                            All questions were answered, and informed consent                            was obtained. Prior Anticoagulants: The patient has                            taken no previous anticoagulant or antiplatelet                            agents. After reviewing the risks and benefits, the                            patient was deemed in satisfactory condition to                            undergo the procedure.                           After obtaining informed consent, the endoscope was                            passed under direct vision. Throughout the  procedure, the patient's blood pressure, pulse, and                            oxygen saturations were monitored continuously. The                            Endoscope was introduced through the mouth, and                            advanced to the second part of duodenum. The upper                            GI endoscopy was accomplished without difficulty.                            The patient tolerated the procedure  well. Scope In: Scope Out: Findings:                 The esophagus was normal.                           The stomach was normal.                           The examined duodenum was normal.                           The cardia and gastric fundus were normal on                            retroflexion. Complications:            No immediate complications. Estimated Blood Loss:     Estimated blood loss: none. Impression:               - Normal esophagus.                           - Normal stomach.                           - Normal examined duodenum.                           - No specimens collected. Recommendation:           - Resume care with Dr. Alvy Bimler for ongoing                            management and monitoring of anemia.                           - Resume previous diet.                           - Continue present medications. Docia Chuck. Henrene Pastor, MD 11/17/2016 11:20:32 AM This report has been signed electronically. CC Letter to:             [place]

## 2016-11-17 NOTE — Progress Notes (Signed)
Pt's states no medical or surgical changes since previsit or office visit. 

## 2016-11-17 NOTE — Op Note (Signed)
[facility] Patient Name: Nicole Nicholson Procedure Date: 11/17/2016 10:38 AM MRN: [id number] Endoscopist: Docia Chuck. Henrene Pastor , MD Age: 36 Referring MD:  Date of Birth: 08-17-1980 Gender: Female Account #: [id number] Procedure:                Colonoscopy, with cold snare polypectomy x 1 Indications:              Iron deficiency anemia. Has also had complaints of                            abdominal pain and constipation Medicines:                Monitored Anesthesia Care Procedure:                Pre-Anesthesia Assessment:                           - Prior to the procedure, a History and Physical                            was performed, and patient medications and                            allergies were reviewed. The patient's tolerance of                            previous anesthesia was also reviewed. The risks                            and benefits of the procedure and the sedation                            options and risks were discussed with the patient.                            All questions were answered, and informed consent                            was obtained. Prior Anticoagulants: The patient has                            taken no previous anticoagulant or antiplatelet                            agents. After reviewing the risks and benefits, the                            patient was deemed in satisfactory condition to                            undergo the procedure.                           After obtaining informed consent, the colonoscope  was passed under direct vision. Throughout the                            procedure, the patient's blood pressure, pulse, and                            oxygen saturations were monitored continuously. The                            Colonoscope was introduced through the anus and                            advanced to the the cecum, identified by                            appendiceal orifice  and ileocecal valve. The                            terminal ileum, ileocecal valve, appendiceal                            orifice, and rectum were photographed. The quality                            of the bowel preparation was excellent. The                            colonoscopy was performed without difficulty. The                            patient tolerated the procedure well. The bowel                            preparation used was SUPREP. Scope In: 10:52:23 AM Scope Out: 11:08:23 AM Scope Withdrawal Time: 0 hours 13 minutes 20 seconds  Total Procedure Duration: 0 hours 16 minutes 0 seconds  Findings:                 The terminal ileum appeared normal.                           A 3 mm polyp was found in the sigmoid colon. The                            polyp was removed with a cold snare. Resection and                            retrieval were complete.                           Internal hemorrhoids were found during                            retroflexion. The hemorrhoids were small.  There was mild melanosis coli. The exam was                            otherwise without abnormality on direct and                            retroflexion views. Complications:            No immediate complications. Estimated blood loss:                            None. Estimated Blood Loss:     Estimated blood loss: none. Impression:               - The examined portion of the ileum was normal.                           - One 3 mm polyp in the sigmoid colon, removed with                            a cold snare. Resected and retrieved.                           - Internal hemorrhoids.                           - Mild melanosis coli. The examination was                            otherwise normal on direct and retroflexion views. Recommendation:           - Repeat colonoscopy in 5-10 years for surveillance.                           - Patient has a contact number  available for                            emergencies. The signs and symptoms of potential                            delayed complications were discussed with the                            patient. Return to normal activities tomorrow.                            Written discharge instructions were provided to the                            patient.                           - Resume previous diet.                           - Continue present medications.                           -  Await pathology results.                           - EGD today. Please see report Docia Chuck. Henrene Pastor, MD 11/17/2016 11:18:18 AM This report has been signed electronically. CC Letter to:             [place]

## 2016-11-17 NOTE — Progress Notes (Signed)
Report to PACU, RN, vss, BBS= Clear.  

## 2016-11-18 ENCOUNTER — Telehealth: Payer: Self-pay

## 2016-11-18 NOTE — Telephone Encounter (Signed)
Left message on answering machine. 

## 2016-11-18 NOTE — Telephone Encounter (Signed)
  Follow up Call-  Call back number 11/17/2016  Post procedure Call Back phone  # [phone]  Permission to leave phone message Yes  Some recent data might be hidden     Patient questions:  Do you have a fever, pain , or abdominal swelling? No. Pain Score  0 *  Have you tolerated food without any problems? Yes.    Have you been able to return to your normal activities? Yes.    Do you have any questions about your discharge instructions: Diet   No. Medications  No. Follow up visit  No.  Do you have questions or concerns about your Care? No.  Actions: * If pain score is 4 or above: No action needed, pain <4.

## 2016-11-22 NOTE — Telephone Encounter (Signed)
Enter in error

## 2016-11-24 ENCOUNTER — Encounter: Payer: Self-pay | Admitting: Internal Medicine

## 2016-11-26 ENCOUNTER — Ambulatory Visit (HOSPITAL_BASED_OUTPATIENT_CLINIC_OR_DEPARTMENT_OTHER): Payer: Medicaid Other

## 2016-11-26 ENCOUNTER — Ambulatory Visit: Payer: Medicaid Other

## 2016-11-26 VITALS — BP 131/80 | HR 75 | Temp 98.6°F | Resp 18

## 2016-11-26 DIAGNOSIS — N920 Excessive and frequent menstruation with regular cycle: Secondary | ICD-10-CM

## 2016-11-26 DIAGNOSIS — D509 Iron deficiency anemia, unspecified: Secondary | ICD-10-CM

## 2016-11-26 DIAGNOSIS — D500 Iron deficiency anemia secondary to blood loss (chronic): Secondary | ICD-10-CM

## 2016-11-26 MED ORDER — SODIUM CHLORIDE 0.9 % IV SOLN
510.0000 mg | Freq: Once | INTRAVENOUS | Status: AC
  Administered 2016-11-26: 510 mg via INTRAVENOUS
  Filled 2016-11-26: qty 17

## 2016-11-26 NOTE — Progress Notes (Signed)
Pt refused to stay 30 min post iron infusion. Pt states she is going out of town to pick up her daughter and needs to drive soon. Encouraged pt to take her nausea medication at home when needed and to increase fluid intake in the next few days. Pt verbalized understanding.

## 2016-11-26 NOTE — Patient Instructions (Signed)
Ferumoxytol injection What is this medicine? FERUMOXYTOL is an iron complex. Iron is used to make healthy red blood cells, which carry oxygen and nutrients throughout the body. This medicine is used to treat iron deficiency anemia in people with chronic kidney disease. COMMON BRAND NAME(S): Feraheme What should I tell my health care provider before I take this medicine? They need to know if you have any of these conditions: -anemia not caused by low iron levels -high levels of iron in the blood -magnetic resonance imaging (MRI) test scheduled -an unusual or allergic reaction to iron, other medicines, foods, dyes, or preservatives -pregnant or trying to get pregnant -breast-feeding How should I use this medicine? This medicine is for injection into a vein. It is given by a health care professional in a hospital or clinic setting. Talk to your pediatrician regarding the use of this medicine in children. Special care may be needed. What if I miss a dose? It is important not to miss your dose. Call your doctor or health care professional if you are unable to keep an appointment. What may interact with this medicine? This medicine may interact with the following medications: -other iron products What should I watch for while using this medicine? Visit your doctor or healthcare professional regularly. Tell your doctor or healthcare professional if your symptoms do not start to get better or if they get worse. You may need blood work done while you are taking this medicine. You may need to follow a special diet. Talk to your doctor. Foods that contain iron include: whole grains/cereals, dried fruits, beans, or peas, leafy green vegetables, and organ meats (liver, kidney). What side effects may I notice from receiving this medicine? Side effects that you should report to your doctor or health care professional as soon as possible: -allergic reactions like skin rash, itching or hives, swelling of the  face, lips, or tongue -breathing problems -changes in blood pressure -feeling faint or lightheaded, falls -fever or chills -flushing, sweating, or hot feelings -swelling of the ankles or feet Side effects that usually do not require medical attention (report to your doctor or health care professional if they continue or are bothersome): -diarrhea -headache -nausea, vomiting -stomach pain Where should I keep my medicine? This drug is given in a hospital or clinic and will not be stored at home.  2017 Elsevier/Gold Standard (2015-06-26 12:41:49)  

## 2017-02-22 ENCOUNTER — Telehealth: Payer: Self-pay

## 2017-02-22 ENCOUNTER — Other Ambulatory Visit (HOSPITAL_BASED_OUTPATIENT_CLINIC_OR_DEPARTMENT_OTHER): Payer: Self-pay

## 2017-02-22 DIAGNOSIS — D500 Iron deficiency anemia secondary to blood loss (chronic): Secondary | ICD-10-CM

## 2017-02-22 DIAGNOSIS — D509 Iron deficiency anemia, unspecified: Secondary | ICD-10-CM

## 2017-02-22 DIAGNOSIS — N920 Excessive and frequent menstruation with regular cycle: Secondary | ICD-10-CM

## 2017-02-22 LAB — FERRITIN: FERRITIN: 16 ng/mL (ref 9–269)

## 2017-02-22 LAB — CBC & DIFF AND RETIC
BASO%: 0.5 % (ref 0.0–2.0)
Basophils Absolute: 0 10*3/uL (ref 0.0–0.1)
EOS%: 2.6 % (ref 0.0–7.0)
Eosinophils Absolute: 0.2 10*3/uL (ref 0.0–0.5)
HCT: 36 % (ref 34.8–46.6)
HGB: 11.8 g/dL (ref 11.6–15.9)
Immature Retic Fract: 8.6 % (ref 1.60–10.00)
LYMPH#: 2.1 10*3/uL (ref 0.9–3.3)
LYMPH%: 36.4 % (ref 14.0–49.7)
MCH: 29.1 pg (ref 25.1–34.0)
MCHC: 32.8 g/dL (ref 31.5–36.0)
MCV: 88.9 fL (ref 79.5–101.0)
MONO#: 0.5 10*3/uL (ref 0.1–0.9)
MONO%: 9.5 % (ref 0.0–14.0)
NEUT#: 2.9 10*3/uL (ref 1.5–6.5)
NEUT%: 51 % (ref 38.4–76.8)
Platelets: 301 10*3/uL (ref 145–400)
RBC: 4.05 10*6/uL (ref 3.70–5.45)
RDW: 13.5 % (ref 11.2–14.5)
RETIC %: 1.32 % (ref 0.70–2.10)
RETIC CT ABS: 53.46 10*3/uL (ref 33.70–90.70)
WBC: 5.7 10*3/uL (ref 3.9–10.3)

## 2017-02-22 NOTE — Telephone Encounter (Signed)
Called with below message. Okay with lab is 3 months.

## 2017-02-22 NOTE — Telephone Encounter (Signed)
-----   Message from Heath Lark, MD sent at 02/22/2017  3:06 PM EDT ----- Regarding: labs pls let her know she is not anemic although ferritin is a bit low I recommend recheck labs in 3 months If OK, please send scehduling msg ----- Message ----- From: Interface, Lab In Three Zero One Sent: 02/22/2017   1:19 PM To: Heath Lark, MD

## 2017-02-24 ENCOUNTER — Telehealth: Payer: Self-pay | Admitting: Hematology and Oncology

## 2017-02-24 NOTE — Telephone Encounter (Signed)
Scheduled lab appt per 9/178 sch message - sent reminder letter in the mail - lab in 3 months.

## 2017-05-04 ENCOUNTER — Telehealth: Payer: Self-pay | Admitting: *Deleted

## 2017-05-04 ENCOUNTER — Other Ambulatory Visit: Payer: Self-pay | Admitting: Hematology and Oncology

## 2017-05-04 NOTE — Telephone Encounter (Signed)
Pt states she had labs drawn at [facility].   Labs to be faxed to Dr Alvy Bimler to review

## 2017-05-09 ENCOUNTER — Ambulatory Visit (HOSPITAL_BASED_OUTPATIENT_CLINIC_OR_DEPARTMENT_OTHER): Payer: Medicaid Other | Admitting: Hematology and Oncology

## 2017-05-09 ENCOUNTER — Telehealth: Payer: Self-pay | Admitting: Hematology and Oncology

## 2017-05-09 ENCOUNTER — Encounter: Payer: Self-pay | Admitting: Hematology and Oncology

## 2017-05-09 ENCOUNTER — Other Ambulatory Visit (HOSPITAL_BASED_OUTPATIENT_CLINIC_OR_DEPARTMENT_OTHER): Payer: Medicaid Other

## 2017-05-09 ENCOUNTER — Other Ambulatory Visit: Payer: Self-pay | Admitting: Hematology and Oncology

## 2017-05-09 ENCOUNTER — Ambulatory Visit (HOSPITAL_COMMUNITY)
Admission: RE | Admit: 2017-05-09 | Discharge: 2017-05-09 | Disposition: A | Discharge status: Home / Self Care | Payer: Medicaid Other | Source: Ambulatory Visit | Attending: Hematology and Oncology | Admitting: Hematology and Oncology

## 2017-05-09 ENCOUNTER — Telehealth: Payer: Self-pay

## 2017-05-09 VITALS — BP 136/84 | HR 71 | Temp 98.0°F | Resp 18 | Ht 67.0 in | Wt 235.6 lb

## 2017-05-09 DIAGNOSIS — R300 Dysuria: Secondary | ICD-10-CM | POA: Insufficient documentation

## 2017-05-09 DIAGNOSIS — D509 Iron deficiency anemia, unspecified: Secondary | ICD-10-CM | POA: Diagnosis not present

## 2017-05-09 DIAGNOSIS — D500 Iron deficiency anemia secondary to blood loss (chronic): Secondary | ICD-10-CM

## 2017-05-09 DIAGNOSIS — N920 Excessive and frequent menstruation with regular cycle: Secondary | ICD-10-CM

## 2017-05-09 LAB — CBC & DIFF AND RETIC
BASO%: 0.4 % (ref 0.0–2.0)
Basophils Absolute: 0 10*3/uL (ref 0.0–0.1)
EOS ABS: 0.1 10*3/uL (ref 0.0–0.5)
EOS%: 2.3 % (ref 0.0–7.0)
HCT: 30.1 % — ABNORMAL LOW (ref 34.8–46.6)
HEMOGLOBIN: 9.2 g/dL — AB (ref 11.6–15.9)
IMMATURE RETIC FRACT: 13.3 % — AB (ref 1.60–10.00)
LYMPH%: 38.8 % (ref 14.0–49.7)
MCH: 24 pg — ABNORMAL LOW (ref 25.1–34.0)
MCHC: 30.6 g/dL — ABNORMAL LOW (ref 31.5–36.0)
MCV: 78.6 fL — AB (ref 79.5–101.0)
MONO#: 0.6 10*3/uL (ref 0.1–0.9)
MONO%: 11.1 % (ref 0.0–14.0)
NEUT%: 47.4 % (ref 38.4–76.8)
NEUTROS ABS: 2.7 10*3/uL (ref 1.5–6.5)
Platelets: 294 10*3/uL (ref 145–400)
RBC: 3.83 10*6/uL (ref 3.70–5.45)
RDW: 15.1 % — ABNORMAL HIGH (ref 11.2–14.5)
Retic %: 1.54 % (ref 0.70–2.10)
Retic Ct Abs: 58.98 10*3/uL (ref 33.70–90.70)
WBC: 5.6 10*3/uL (ref 3.9–10.3)
lymph#: 2.2 10*3/uL (ref 0.9–3.3)

## 2017-05-09 LAB — FERRITIN: FERRITIN: 14 ng/mL (ref 9–269)

## 2017-05-09 MED ORDER — SODIUM CHLORIDE 0.9 % IV SOLN
510.0000 mg | Freq: Once | INTRAVENOUS | Status: AC
  Administered 2017-05-09: 510 mg via INTRAVENOUS
  Filled 2017-05-09: qty 17

## 2017-05-09 NOTE — Progress Notes (Signed)
[facility] [facility] OFFICE PROGRESS NOTE  Nicole Hummingbird, PA-C SUMMARY OF HEMATOLOGIC HISTORY:  Nicole Nicholson is seen here for severe symptomatic anemia and severe fatigue  She was found to have abnormal CBC from recent ER/urgent care visit. She has "anemia all her life". She had multiple pregnancies in the past and was told she was anemic She denies recent chest pain on exertion, but does have shortness of breath on minimal exertion, pre-syncopal episodes, or palpitations. She had not noticed any recent bleeding such as epistaxis, hematuria or hematochezia The patient has occasional over the counter NSAID ingestion. She is not on antiplatelets agents.  She had no prior history or diagnosis of cancer. Her age appropriate screening programs are up-to-date. She complained of pica but eats a variety of diet. She never donated blood or received blood transfusion The patient was prescribed oral iron supplements and she takes 1 daily but tolerated that poorly. She has excessive menorrhagia monthly and was told she has fibroids/ovarian cysts. She had thyroid surgery in the past and complained of chronic fatigue She received multiple doses of IV iron in 2016 & 2017 and March 2018 with improvement of energy level She had EGD and colonoscopy which showed no evidence of bleeding lesions INTERVAL HISTORY: Nicole Nicholson 36 y.o. female returns for further follow-up. The patient complained of excessive fatigue She denies NSAID She is concerned about possible undiagnosed malignancy even though recent EGD and colonoscopy showed no evidence of malignancy The patient denies menorrhagia.  I have reviewed the past medical history, past surgical history, social history and family history with the patient and they are unchanged from previous note.  ALLERGIES:  is allergic to codeine; darvocet [propoxyphene n-acetaminophen]; percocet [oxycodone-acetaminophen]; tylenol [acetaminophen]; and  mirabegron.  MEDICATIONS:  Current Outpatient Medications  Medication Sig Dispense Refill  . ALPRAZolam (XANAX) 0.5 MG tablet TK 1 T PO  Q 8 H PRF ANXIETY  1  . diclofenac (VOLTAREN) 50 MG EC tablet TK 1 T PO TID PRN  1  . DULoxetine (CYMBALTA) 30 MG capsule TK ONE C PO D  3  . furosemide (LASIX) 40 MG tablet TK 1 T PO  every other day  1  . hydrOXYzine (ATARAX/VISTARIL) 25 MG tablet TK 1 T PO Q 4-6 H PRF ITCHINESS  1  . loratadine (CLARITIN) 10 MG tablet TK 1 T PO D daily  1  . NATURE-THROID 97.5 MG TABS TK 1 AND 1/2 TS PO QD  11  . ondansetron (ZOFRAN-ODT) 8 MG disintegrating tablet DIS 1 T ON THE TONGUE Q 8 H PRN  2  . promethazine (PHENERGAN) 50 MG tablet TK 1 T PO  Q 6 H PRN  1  . traMADol (ULTRAM) 50 MG tablet TK 1 T PO QID PRN P  0  . Vitamin D, Ergocalciferol, (DRISDOL) 50000 units CAPS capsule Take 50,000 Units by mouth every 7 (seven) days.      Current Facility-Administered Medications  Medication Dose Route Frequency Provider Last Rate Last Dose  . 0.9 %  sodium chloride infusion  500 mL Intravenous Continuous Irene Shipper, MD         REVIEW OF SYSTEMS:   Constitutional: Denies fevers, chills or night sweats Eyes: Denies blurriness of vision Ears, nose, mouth, throat, and face: Denies mucositis or sore throat Respiratory: Denies cough, dyspnea or wheezes Cardiovascular: Denies palpitation, chest discomfort or lower extremity swelling Gastrointestinal:  Denies nausea, heartburn or change in bowel habits Skin: Denies abnormal skin rashes Lymphatics: Denies  new lymphadenopathy or easy bruising Neurological:Denies numbness, tingling or new weaknesses Behavioral/Psych: Mood is stable, no new changes  All other systems were reviewed with the patient and are negative.  PHYSICAL EXAMINATION: ECOG PERFORMANCE STATUS: 1 - Symptomatic but completely ambulatory  Vitals:   05/09/17 0906  BP: 136/84  Pulse: 71  Resp: 18  Temp: 98 F (36.7 C)  SpO2: 100%   Filed Weights    05/09/17 0906  Weight: 235 lb 9.6 oz (106.9 kg)    GENERAL:alert, no distress and comfortable SKIN: skin color, texture, turgor are normal, no rashes or significant lesions EYES: normal, Conjunctiva are pink and non-injected, sclera clear OROPHARYNX:no exudate, no erythema and lips, buccal mucosa, and tongue normal  NECK: supple, thyroid normal size, non-tender, without nodularity LYMPH:  no palpable lymphadenopathy in the cervical, axillary or inguinal LUNGS: clear to auscultation and percussion with normal breathing effort HEART: regular rate & rhythm and no murmurs and no lower extremity edema ABDOMEN:abdomen soft, non-tender and normal bowel sounds Musculoskeletal:no cyanosis of digits and no clubbing  NEURO: alert & oriented x 3 with fluent speech, no focal motor/sensory deficits  LABORATORY DATA:  I have reviewed the data as listed     Component Value Date/Time   NA 139 02/13/2015 1815   K 3.7 02/13/2015 1815   CL 104 02/13/2015 1815   CO2 28 02/13/2015 1815   GLUCOSE 84 02/13/2015 1815   BUN 12 02/13/2015 1815   CREATININE 0.92 02/13/2015 1815   CALCIUM 9.2 02/13/2015 1815   PROT 7.6 11/13/2014 0720   ALBUMIN 3.5 11/13/2014 0720   AST 21 11/13/2014 0720   ALT 12 (L) 11/13/2014 0720   ALKPHOS 54 11/13/2014 0720   BILITOT 0.1 (L) 11/13/2014 0720   GFRNONAA >60 02/13/2015 1815   GFRAA >60 02/13/2015 1815    No results found for: SPEP, UPEP  Lab Results  Component Value Date   WBC 5.6 05/09/2017   NEUTROABS 2.7 05/09/2017   HGB 9.2 (L) 05/09/2017   HCT 30.1 (L) 05/09/2017   MCV 78.6 (L) 05/09/2017   PLT 294 05/09/2017      Chemistry      Component Value Date/Time   NA 139 02/13/2015 1815   K 3.7 02/13/2015 1815   CL 104 02/13/2015 1815   CO2 28 02/13/2015 1815   BUN 12 02/13/2015 1815   CREATININE 0.92 02/13/2015 1815      Component Value Date/Time   CALCIUM 9.2 02/13/2015 1815   ALKPHOS 54 11/13/2014 0720   AST 21 11/13/2014 0720   ALT 12 (L)  11/13/2014 0720   BILITOT 0.1 (L) 11/13/2014 0720       ASSESSMENT & PLAN:  Iron deficiency anemia The patient had extensive workup including negative EGD and colonoscopy She is adamant she does not have menorrhagia as a cause of recurrent iron deficiency anemia I do recommend consideration to take hormonal manipulation therapy to stop her periods to see if that would fix her problem with recurrent iron deficiency anemia I will also reach out to GI service to see if capsule endoscopy is necessary The most likely cause of her anemia is due to chronic blood loss/malabsorption syndrome. We discussed some of the risks, benefits, and alternatives of intravenous iron infusions. The patient is symptomatic from anemia and the iron level is critically low. She tolerated oral iron supplement poorly and desires to achieved higher levels of iron faster for adequate hematopoesis. Some of the side-effects to be expected including risks of infusion reactions,  phlebitis, headaches, nausea and fatigue.  The patient is willing to proceed. Patient education material was dispensed.  Goal is to keep ferritin level greater than 50   Dysuria She complained of symptoms of dysuria I will order urinalysis and urine culture to exclude UTI   Orders Placed This Encounter  Procedures  . Urine Culture    Standing Status:   Future    Number of Occurrences:   1    Standing Expiration Date:   06/13/2018  . Urinalysis, Microscopic - [facility]    Standing Status:   Future    Number of Occurrences:   1    Standing Expiration Date:   06/13/2018    All questions were answered. The patient knows to call the clinic with any problems, questions or concerns. No barriers to learning was detected.  I spent 15 minutes counseling the patient face to face. The total time spent in the appointment was 20 minutes and more than 50% was on counseling.     Heath Lark, MD 12/3/201812:49 PM

## 2017-05-09 NOTE — Discharge Instructions (Signed)

## 2017-05-09 NOTE — Assessment & Plan Note (Signed)
The patient had extensive workup including negative EGD and colonoscopy She is adamant she does not have menorrhagia as a cause of recurrent iron deficiency anemia I do recommend consideration to take hormonal manipulation therapy to stop her periods to see if that would fix her problem with recurrent iron deficiency anemia I will also reach out to GI service to see if capsule endoscopy is necessary The most likely cause of her anemia is due to chronic blood loss/malabsorption syndrome. We discussed some of the risks, benefits, and alternatives of intravenous iron infusions. The patient is symptomatic from anemia and the iron level is critically low. She tolerated oral iron supplement poorly and desires to achieved higher levels of iron faster for adequate hematopoesis. Some of the side-effects to be expected including risks of infusion reactions, phlebitis, headaches, nausea and fatigue.  The patient is willing to proceed. Patient education material was dispensed.  Goal is to keep ferritin level greater than 50

## 2017-05-09 NOTE — Assessment & Plan Note (Signed)
She complained of symptoms of dysuria I will order urinalysis and urine culture to exclude UTI

## 2017-05-09 NOTE — Telephone Encounter (Signed)
Called and told to come back to the lab at [facility] to give urine specimen after getting IV iron at patient care management. Verbalized understanding.

## 2017-05-09 NOTE — Telephone Encounter (Signed)
Patient declined avs and calendar for December and March 2019

## 2017-05-09 NOTE — Progress Notes (Signed)
Diagnosis: Iron deficiency  Physician: Dr. Alvy Bimler  Procedure: Patient came into [facility] and received 510 mg  IV Feraheme. Patient tolerated well. Vitals are stable. Patient given discharge instruction and verbalized understanding. Patient discharged alert, oriented, verbal and ambulatory with husband.

## 2017-05-17 ENCOUNTER — Ambulatory Visit (HOSPITAL_BASED_OUTPATIENT_CLINIC_OR_DEPARTMENT_OTHER): Payer: Medicaid Other

## 2017-05-17 ENCOUNTER — Telehealth: Payer: Self-pay | Admitting: Hematology and Oncology

## 2017-05-17 VITALS — BP 123/71 | HR 94 | Temp 99.1°F | Resp 18

## 2017-05-17 DIAGNOSIS — D509 Iron deficiency anemia, unspecified: Secondary | ICD-10-CM

## 2017-05-17 MED ORDER — SODIUM CHLORIDE 0.9 % IV SOLN
INTRAVENOUS | Status: DC
  Administered 2017-05-17: 14:00:00 via INTRAVENOUS

## 2017-05-17 MED ORDER — FERUMOXYTOL INJECTION 510 MG/17 ML
510.0000 mg | Freq: Once | INTRAVENOUS | Status: DC
  Filled 2017-05-17: qty 17

## 2017-05-17 NOTE — Telephone Encounter (Signed)
Patient thought her appointment was today Kenney Houseman said it was ok to add

## 2017-05-17 NOTE — Patient Instructions (Signed)
Ferumoxytol injection What is this medicine? FERUMOXYTOL is an iron complex. Iron is used to make healthy red blood cells, which carry oxygen and nutrients throughout the body. This medicine is used to treat iron deficiency anemia in people with chronic kidney disease. COMMON BRAND NAME(S): Feraheme What should I tell my health care provider before I take this medicine? They need to know if you have any of these conditions: -anemia not caused by low iron levels -high levels of iron in the blood -magnetic resonance imaging (MRI) test scheduled -an unusual or allergic reaction to iron, other medicines, foods, dyes, or preservatives -pregnant or trying to get pregnant -breast-feeding How should I use this medicine? This medicine is for injection into a vein. It is given by a health care professional in a hospital or clinic setting. Talk to your pediatrician regarding the use of this medicine in children. Special care may be needed. What if I miss a dose? It is important not to miss your dose. Call your doctor or health care professional if you are unable to keep an appointment. What may interact with this medicine? This medicine may interact with the following medications: -other iron products What should I watch for while using this medicine? Visit your doctor or healthcare professional regularly. Tell your doctor or healthcare professional if your symptoms do not start to get better or if they get worse. You may need blood work done while you are taking this medicine. You may need to follow a special diet. Talk to your doctor. Foods that contain iron include: whole grains/cereals, dried fruits, beans, or peas, leafy green vegetables, and organ meats (liver, kidney). What side effects may I notice from receiving this medicine? Side effects that you should report to your doctor or health care professional as soon as possible: -allergic reactions like skin rash, itching or hives, swelling of the  face, lips, or tongue -breathing problems -changes in blood pressure -feeling faint or lightheaded, falls -fever or chills -flushing, sweating, or hot feelings -swelling of the ankles or feet Side effects that usually do not require medical attention (report to your doctor or health care professional if they continue or are bothersome): -diarrhea -headache -nausea, vomiting -stomach pain Where should I keep my medicine? This drug is given in a hospital or clinic and will not be stored at home.  2017 Elsevier/Gold Standard (2015-06-26 12:41:49)  

## 2017-05-18 ENCOUNTER — Ambulatory Visit: Payer: Medicaid Other

## 2017-05-23 ENCOUNTER — Other Ambulatory Visit: Payer: Self-pay

## 2017-05-25 NOTE — Progress Notes (Signed)
Feraheme given from [id number] 05/17/17. Medication was not scanned day of treatment.

## 2017-08-08 ENCOUNTER — Inpatient Hospital Stay: Payer: Medicaid Other | Attending: Hematology and Oncology | Admitting: Hematology and Oncology

## 2017-08-08 ENCOUNTER — Telehealth: Payer: Self-pay | Admitting: Hematology and Oncology

## 2017-08-08 ENCOUNTER — Encounter: Payer: Self-pay | Admitting: Hematology and Oncology

## 2017-08-08 ENCOUNTER — Inpatient Hospital Stay: Payer: Medicaid Other

## 2017-08-08 DIAGNOSIS — D509 Iron deficiency anemia, unspecified: Secondary | ICD-10-CM

## 2017-08-08 DIAGNOSIS — N920 Excessive and frequent menstruation with regular cycle: Secondary | ICD-10-CM | POA: Diagnosis not present

## 2017-08-08 DIAGNOSIS — R300 Dysuria: Secondary | ICD-10-CM

## 2017-08-08 LAB — CBC WITH DIFFERENTIAL/PLATELET
BASOS PCT: 1 %
Basophils Absolute: 0 10*3/uL (ref 0.0–0.1)
EOS ABS: 0.1 10*3/uL (ref 0.0–0.5)
EOS PCT: 1 %
HCT: 33.9 % — ABNORMAL LOW (ref 34.8–46.6)
Hemoglobin: 10.6 g/dL — ABNORMAL LOW (ref 11.6–15.9)
Lymphocytes Relative: 37 %
Lymphs Abs: 2.4 10*3/uL (ref 0.9–3.3)
MCH: 26.2 pg (ref 25.1–34.0)
MCHC: 31.3 g/dL — AB (ref 31.5–36.0)
MCV: 83.9 fL (ref 79.5–101.0)
MONO ABS: 0.7 10*3/uL (ref 0.1–0.9)
MONOS PCT: 10 %
Neutro Abs: 3.3 10*3/uL (ref 1.5–6.5)
Neutrophils Relative %: 51 %
Platelets: 384 10*3/uL (ref 145–400)
RBC: 4.04 MIL/uL (ref 3.70–5.45)
RDW: 15.4 % — AB (ref 11.2–14.5)
WBC: 6.5 10*3/uL (ref 3.9–10.3)

## 2017-08-08 LAB — RETICULOCYTES
RBC.: 4.04 MIL/uL (ref 3.70–5.45)
RETIC CT PCT: 1.4 % (ref 0.7–2.1)
Retic Count, Absolute: 56.6 10*3/uL (ref 33.7–90.7)

## 2017-08-08 LAB — FERRITIN: Ferritin: 13 ng/mL (ref 9–269)

## 2017-08-08 NOTE — Progress Notes (Signed)
[facility] OFFICE PROGRESS NOTE  [name], MD SUMMARY OF HEMATOLOGIC HISTORY:  Nicole Nicholson is seen here for severe symptomatic anemia and severe fatigue  She was found to have abnormal CBC from recent ER/urgent care visit. She has "anemia all her life". She had multiple pregnancies in the past and was told she was anemic She denies recent chest pain on exertion, but does have shortness of breath on minimal exertion, pre-syncopal episodes, or palpitations. She had not noticed any recent bleeding such as epistaxis, hematuria or hematochezia The patient has occasional over the counter NSAID ingestion. She is not on antiplatelets agents.  She had no prior history or diagnosis of cancer. Her age appropriate screening programs are up-to-date. She complained of pica but eats a variety of diet. She never donated blood or received blood transfusion The patient was prescribed oral iron supplements and she takes 1 daily but tolerated that poorly. She has excessive menorrhagia monthly and was told she has fibroids/ovarian cysts. She had thyroid surgery in the past and complained of chronic fatigue She received multiple doses of IV iron in 2016 & 2017 and March 2018 with improvement of energy level She had EGD and colonoscopy in 2018 which showed no evidence of bleeding lesions INTERVAL HISTORY: Nicole Nicholson 37 y.o. female returns for further follow-up. She missed her second dose of intravenous iron in December She is anemic again The patient denies any recent signs or symptoms of bleeding such as spontaneous epistaxis, hematuria or hematochezia. She eats meat. The patient denies pica.  She denies NSAID use  I have reviewed the past medical history, past surgical history, social history and family history with the patient and they are unchanged from previous note.  ALLERGIES:  is allergic to codeine; darvocet [propoxyphene n-acetaminophen]; percocet  [oxycodone-acetaminophen]; tylenol [acetaminophen]; and mirabegron.  MEDICATIONS:  Current Outpatient Medications  Medication Sig Dispense Refill  . ALPRAZolam (XANAX) 0.5 MG tablet TK 1 T PO  Q 8 H PRF ANXIETY  1  . diclofenac (VOLTAREN) 50 MG EC tablet TK 1 T PO TID PRN  1  . DULoxetine (CYMBALTA) 30 MG capsule TK ONE C PO D  3  . furosemide (LASIX) 40 MG tablet TK 1 T PO  every other day  1  . hydrOXYzine (ATARAX/VISTARIL) 25 MG tablet TK 1 T PO Q 4-6 H PRF ITCHINESS  1  . loratadine (CLARITIN) 10 MG tablet TK 1 T PO D daily  1  . NATURE-THROID 97.5 MG TABS TK 1 AND 1/2 TS PO QD  11  . ondansetron (ZOFRAN-ODT) 8 MG disintegrating tablet DIS 1 T ON THE TONGUE Q 8 H PRN  2  . promethazine (PHENERGAN) 50 MG tablet TK 1 T PO  Q 6 H PRN  1  . traMADol (ULTRAM) 50 MG tablet TK 1 T PO QID PRN P  0  . Vitamin D, Ergocalciferol, (DRISDOL) 50000 units CAPS capsule Take 50,000 Units by mouth every 7 (seven) days.      Current Facility-Administered Medications  Medication Dose Route Frequency Provider Last Rate Last Dose  . 0.9 %  sodium chloride infusion  500 mL Intravenous Continuous Irene Shipper, MD         REVIEW OF SYSTEMS:   Constitutional: Denies fevers, chills or night sweats Eyes: Denies blurriness of vision Ears, nose, mouth, throat, and face: Denies mucositis or sore throat Respiratory: Denies cough, dyspnea or wheezes Cardiovascular: Denies palpitation, chest discomfort or lower extremity swelling Gastrointestinal:  Denies  nausea, heartburn or change in bowel habits Skin: Denies abnormal skin rashes Lymphatics: Denies new lymphadenopathy or easy bruising Neurological:Denies numbness, tingling or new weaknesses Behavioral/Psych: Mood is stable, no new changes  All other systems were reviewed with the patient and are negative.  PHYSICAL EXAMINATION: ECOG PERFORMANCE STATUS: 1 - Symptomatic but completely ambulatory  Vitals:   08/08/17 1230  BP: 130/87  Pulse: 79  Resp: 18   Temp: 97.8 F (36.6 C)  SpO2: 100%   Filed Weights   08/08/17 1230  Weight: 238 lb (108 kg)    GENERAL:alert, no distress and comfortable NEURO: alert & oriented x 3 with fluent speech, no focal motor/sensory deficits  LABORATORY DATA:  I have reviewed the data as listed     Component Value Date/Time   NA 139 02/13/2015 1815   K 3.7 02/13/2015 1815   CL 104 02/13/2015 1815   CO2 28 02/13/2015 1815   GLUCOSE 84 02/13/2015 1815   BUN 12 02/13/2015 1815   CREATININE 0.92 02/13/2015 1815   CALCIUM 9.2 02/13/2015 1815   PROT 7.6 11/13/2014 0720   ALBUMIN 3.5 11/13/2014 0720   AST 21 11/13/2014 0720   ALT 12 (L) 11/13/2014 0720   ALKPHOS 54 11/13/2014 0720   BILITOT 0.1 (L) 11/13/2014 0720   GFRNONAA >60 02/13/2015 1815   GFRAA >60 02/13/2015 1815    No results found for: SPEP, UPEP  Lab Results  Component Value Date   WBC 6.5 08/08/2017   NEUTROABS 3.3 08/08/2017   HGB 10.6 (L) 08/08/2017   HCT 33.9 (L) 08/08/2017   MCV 83.9 08/08/2017   PLT 384 08/08/2017      Chemistry      Component Value Date/Time   NA 139 02/13/2015 1815   K 3.7 02/13/2015 1815   CL 104 02/13/2015 1815   CO2 28 02/13/2015 1815   BUN 12 02/13/2015 1815   CREATININE 0.92 02/13/2015 1815      Component Value Date/Time   CALCIUM 9.2 02/13/2015 1815   ALKPHOS 54 11/13/2014 0720   AST 21 11/13/2014 0720   ALT 12 (L) 11/13/2014 0720   BILITOT 0.1 (L) 11/13/2014 0720      ASSESSMENT & PLAN:  Iron deficiency anemia The patient had extensive workup including negative EGD and colonoscopy in 2018 The most likely cause of her anemia is due to chronic blood loss/malabsorption syndrome. We discussed some of the risks, benefits, and alternatives of intravenous iron infusions. The patient is symptomatic from anemia and the iron level is critically low. She tolerated oral iron supplement poorly and desires to achieved higher levels of iron faster for adequate hematopoesis. Some of the  side-effects to be expected including risks of infusion reactions, phlebitis, headaches, nausea and fatigue.  The patient is willing to proceed. Patient education material was dispensed.  Goal is to keep ferritin level greater than 50 and resolution of anemia I suspect her because of iron deficiency anemia is due to chronic menorrhagia I recommend evaluation by her primary care doctor and possibly referral to see a gynecologist for further management I plan to schedule 2 doses of intravenous iron and then repeat CBC and iron studies again in April I will schedule future follow-up appointment depending on results from April We discussed potential  home intravenous iron infusion due to history of missing appointments but the patient declined that offer  Menorrhagia with regular cycle She was told that she had history of uterine fibroid Her last Pap smear has been negative I  told the patient that giving her intravenous iron will not fix the underlying problem which is menorrhagia I recommend GYN evaluation for further management   No orders of the defined types were placed in this encounter.   All questions were answered. The patient knows to call the clinic with any problems, questions or concerns. No barriers to learning was detected.  I spent 15 minutes counseling the patient face to face. The total time spent in the appointment was 20 minutes and more than 50% was on counseling.     Heath Lark, MD 3/4/201912:54 PM

## 2017-08-08 NOTE — Telephone Encounter (Signed)
Patient declined AVs and calendar of upcoming march and April appointments. Patient will receive update in mychart.

## 2017-08-08 NOTE — Assessment & Plan Note (Signed)
She was told that she had history of uterine fibroid Her last Pap smear has been negative I told the patient that giving her intravenous iron will not fix the underlying problem which is menorrhagia I recommend GYN evaluation for further management 

## 2017-08-08 NOTE — Assessment & Plan Note (Signed)
The patient had extensive workup including negative EGD and colonoscopy in 2018 The most likely cause of her anemia is due to chronic blood loss/malabsorption syndrome. We discussed some of the risks, benefits, and alternatives of intravenous iron infusions. The patient is symptomatic from anemia and the iron level is critically low. She tolerated oral iron supplement poorly and desires to achieved higher levels of iron faster for adequate hematopoesis. Some of the side-effects to be expected including risks of infusion reactions, phlebitis, headaches, nausea and fatigue.  The patient is willing to proceed. Patient education material was dispensed.  Goal is to keep ferritin level greater than 50 and resolution of anemia I suspect her because of iron deficiency anemia is due to chronic menorrhagia I recommend evaluation by her primary care doctor and possibly referral to see a gynecologist for further management I plan to schedule 2 doses of intravenous iron and then repeat CBC and iron studies again in April I will schedule future follow-up appointment depending on results from April We discussed potential  home intravenous iron infusion due to history of missing appointments but the patient declined that offer

## 2017-08-19 ENCOUNTER — Inpatient Hospital Stay: Payer: Medicaid Other

## 2017-08-19 VITALS — BP 122/84 | HR 82 | Temp 98.4°F | Resp 18

## 2017-08-19 DIAGNOSIS — D509 Iron deficiency anemia, unspecified: Secondary | ICD-10-CM | POA: Diagnosis not present

## 2017-08-19 MED ORDER — SODIUM CHLORIDE 0.9 % IV SOLN
510.0000 mg | Freq: Once | INTRAVENOUS | Status: AC
  Administered 2017-08-19: 510 mg via INTRAVENOUS
  Filled 2017-08-19: qty 17

## 2017-08-19 NOTE — Patient Instructions (Signed)

## 2017-08-26 ENCOUNTER — Inpatient Hospital Stay: Payer: Medicaid Other

## 2017-08-26 VITALS — BP 113/62 | HR 78 | Temp 98.6°F | Resp 17

## 2017-08-26 DIAGNOSIS — D509 Iron deficiency anemia, unspecified: Secondary | ICD-10-CM | POA: Diagnosis not present

## 2017-08-26 MED ORDER — SODIUM CHLORIDE 0.9 % IV SOLN
510.0000 mg | Freq: Once | INTRAVENOUS | Status: AC
  Administered 2017-08-26: 510 mg via INTRAVENOUS
  Filled 2017-08-26: qty 17

## 2017-08-26 MED ORDER — SODIUM CHLORIDE 0.9 % IV SOLN
Freq: Once | INTRAVENOUS | Status: AC
  Administered 2017-08-26: 13:00:00 via INTRAVENOUS

## 2017-08-26 NOTE — Patient Instructions (Signed)

## 2017-09-23 ENCOUNTER — Other Ambulatory Visit: Payer: Self-pay | Admitting: *Deleted

## 2017-09-23 DIAGNOSIS — D509 Iron deficiency anemia, unspecified: Secondary | ICD-10-CM

## 2017-09-26 ENCOUNTER — Inpatient Hospital Stay: Payer: Medicaid Other

## 2017-09-30 ENCOUNTER — Inpatient Hospital Stay: Payer: Medicaid Other | Attending: Hematology and Oncology

## 2017-09-30 DIAGNOSIS — D509 Iron deficiency anemia, unspecified: Secondary | ICD-10-CM | POA: Diagnosis present

## 2017-09-30 LAB — RETICULOCYTES
RBC.: 4.06 MIL/uL (ref 3.70–5.45)
RETIC CT PCT: 1.4 % (ref 0.7–2.1)
Retic Count, Absolute: 56.8 10*3/uL (ref 33.7–90.7)

## 2017-09-30 LAB — CBC WITH DIFFERENTIAL (CANCER CENTER ONLY)
BASOS ABS: 0 10*3/uL (ref 0.0–0.1)
BASOS PCT: 0 %
EOS ABS: 0.1 10*3/uL (ref 0.0–0.5)
Eosinophils Relative: 1 %
HCT: 35.5 % (ref 34.8–46.6)
HEMOGLOBIN: 11.6 g/dL (ref 11.6–15.9)
Lymphocytes Relative: 28 %
Lymphs Abs: 1.6 10*3/uL (ref 0.9–3.3)
MCH: 28.6 pg (ref 25.1–34.0)
MCHC: 32.7 g/dL (ref 31.5–36.0)
MCV: 87.4 fL (ref 79.5–101.0)
MONOS PCT: 10 %
Monocytes Absolute: 0.6 10*3/uL (ref 0.1–0.9)
Neutro Abs: 3.3 10*3/uL (ref 1.5–6.5)
Neutrophils Relative %: 61 %
Platelet Count: 316 10*3/uL (ref 145–400)
RBC: 4.06 MIL/uL (ref 3.70–5.45)
RDW: 18.6 % — AB (ref 11.2–14.5)
WBC Count: 5.5 10*3/uL (ref 3.9–10.3)

## 2017-09-30 LAB — FERRITIN: Ferritin: 76 ng/mL (ref 9–269)

## 2017-10-03 ENCOUNTER — Telehealth: Payer: Self-pay | Admitting: Hematology and Oncology

## 2017-10-03 ENCOUNTER — Telehealth: Payer: Self-pay | Admitting: *Deleted

## 2017-10-03 NOTE — Telephone Encounter (Signed)
-----   Message from Heath Lark, MD sent at 10/03/2017  8:59 AM EDT ----- Regarding: labs ok pls let her know labs are ok No need Iv iron Does she want to get labs rechecked here or with PCP? If here, labs only in 3 months ----- Message ----- From: Interface, Lab In [place] Sent: 09/30/2017  11:35 AM To: Heath Lark, MD

## 2017-10-03 NOTE — Telephone Encounter (Signed)
Scheduled appt per 4/29 sch message - unable to reach pt - left message with appt date and time   

## 2017-10-03 NOTE — Telephone Encounter (Signed)
Notified of message below. States she would like labs done here.  Message to scheduler

## 2018-01-02 ENCOUNTER — Inpatient Hospital Stay: Payer: Medicaid Other | Attending: Hematology and Oncology

## 2018-01-02 ENCOUNTER — Other Ambulatory Visit: Payer: Self-pay | Admitting: Hematology and Oncology

## 2018-01-02 DIAGNOSIS — D509 Iron deficiency anemia, unspecified: Secondary | ICD-10-CM | POA: Diagnosis present

## 2018-01-02 LAB — CBC WITH DIFFERENTIAL/PLATELET
Basophils Absolute: 0 10*3/uL (ref 0.0–0.1)
Basophils Relative: 0 %
EOS PCT: 3 %
Eosinophils Absolute: 0.1 10*3/uL (ref 0.0–0.5)
HCT: 33.9 % — ABNORMAL LOW (ref 34.8–46.6)
Hemoglobin: 10.9 g/dL — ABNORMAL LOW (ref 11.6–15.9)
Lymphocytes Relative: 33 %
Lymphs Abs: 1.7 10*3/uL (ref 0.9–3.3)
MCH: 27 pg (ref 25.1–34.0)
MCHC: 32.2 g/dL (ref 31.5–36.0)
MCV: 84.1 fL (ref 79.5–101.0)
Monocytes Absolute: 0.4 10*3/uL (ref 0.1–0.9)
Monocytes Relative: 8 %
Neutro Abs: 2.8 10*3/uL (ref 1.5–6.5)
Neutrophils Relative %: 56 %
PLATELETS: 305 10*3/uL (ref 145–400)
RBC: 4.03 MIL/uL (ref 3.70–5.45)
RDW: 13.6 % (ref 11.2–14.5)
WBC: 5.1 10*3/uL (ref 3.9–10.3)

## 2018-01-03 ENCOUNTER — Telehealth: Payer: Self-pay | Admitting: *Deleted

## 2018-01-03 ENCOUNTER — Other Ambulatory Visit: Payer: Self-pay | Admitting: Hematology and Oncology

## 2018-01-03 LAB — FERRITIN: Ferritin: 12 ng/mL (ref 11–307)

## 2018-01-03 LAB — IRON AND TIBC
IRON: 40 ug/dL — AB (ref 41–142)
Saturation Ratios: 11 % — ABNORMAL LOW (ref 21–57)
TIBC: 350 ug/dL (ref 236–444)
UIBC: 310 ug/dL

## 2018-01-03 NOTE — Telephone Encounter (Signed)
-----   Message from Heath Lark, MD sent at 01/03/2018  9:26 AM EDT ----- Regarding: recurrent IDA Pls let her know she needs repeat IV iron again Scheduler will call her for appt ----- Message ----- From: Interface, Lab In [place] Sent: 01/02/2018   3:25 PM To: Heath Lark, MD

## 2018-01-03 NOTE — Telephone Encounter (Signed)
Left message with note below 

## 2018-01-04 ENCOUNTER — Telehealth: Payer: Self-pay | Admitting: Hematology and Oncology

## 2018-01-04 NOTE — Telephone Encounter (Signed)
Left message for patient to call office to get scheduled for appts per 7/30 sch message.

## 2018-01-09 ENCOUNTER — Telehealth: Payer: Self-pay | Admitting: Hematology and Oncology

## 2018-01-13 ENCOUNTER — Inpatient Hospital Stay: Payer: Medicaid Other

## 2018-01-13 ENCOUNTER — Encounter: Payer: Self-pay | Admitting: Hematology and Oncology

## 2018-01-13 ENCOUNTER — Telehealth: Payer: Self-pay | Admitting: Hematology and Oncology

## 2018-01-13 ENCOUNTER — Inpatient Hospital Stay: Payer: Medicaid Other | Attending: Hematology and Oncology | Admitting: Hematology and Oncology

## 2018-01-13 VITALS — BP 107/55 | HR 73 | Temp 98.4°F | Resp 18 | Ht 67.0 in | Wt 245.2 lb

## 2018-01-13 VITALS — BP 118/70 | HR 70 | Resp 17

## 2018-01-13 DIAGNOSIS — K909 Intestinal malabsorption, unspecified: Secondary | ICD-10-CM

## 2018-01-13 DIAGNOSIS — D509 Iron deficiency anemia, unspecified: Secondary | ICD-10-CM | POA: Diagnosis present

## 2018-01-13 DIAGNOSIS — D500 Iron deficiency anemia secondary to blood loss (chronic): Secondary | ICD-10-CM

## 2018-01-13 DIAGNOSIS — N920 Excessive and frequent menstruation with regular cycle: Secondary | ICD-10-CM | POA: Insufficient documentation

## 2018-01-13 MED ORDER — SODIUM CHLORIDE 0.9 % IV SOLN
Freq: Once | INTRAVENOUS | Status: AC
  Administered 2018-01-13: 15:00:00 via INTRAVENOUS
  Filled 2018-01-13: qty 250

## 2018-01-13 MED ORDER — SODIUM CHLORIDE 0.9 % IV SOLN
510.0000 mg | Freq: Once | INTRAVENOUS | Status: AC
  Administered 2018-01-13: 510 mg via INTRAVENOUS
  Filled 2018-01-13: qty 17

## 2018-01-13 NOTE — Telephone Encounter (Signed)
Gave patient avs and calendar of upcoming appts.  °

## 2018-01-13 NOTE — Assessment & Plan Note (Signed)
She was told that she had history of uterine fibroid Her last Pap smear has been negative I told the patient that giving her intravenous iron will not fix the underlying problem which is menorrhagia I recommend GYN evaluation for further management

## 2018-01-13 NOTE — Progress Notes (Signed)
[facility] OFFICE PROGRESS NOTE  Nicole Roche, MD  ASSESSMENT & PLAN:  Iron deficiency anemia The patient had extensive workup including negative EGD and colonoscopy in 2018 The most likely cause of her anemia is due to chronic blood loss/malabsorption syndrome. We discussed some of the risks, benefits, and alternatives of intravenous iron infusions. The patient is symptomatic from anemia and the iron level is critically low. She tolerated oral iron supplement poorly and desires to achieved higher levels of iron faster for adequate hematopoesis. Some of the side-effects to be expected including risks of infusion reactions, phlebitis, headaches, nausea and fatigue.  The patient is willing to proceed. Patient education material was dispensed.  Goal is to keep ferritin level greater than 50 and resolution of anemia I suspect her because of iron deficiency anemia is due to chronic menorrhagia She is currently undergoing evaluation by gynecologist and I would defer to them for further management. The patient has declined home infusion  Menorrhagia with regular cycle She was told that she had history of uterine fibroid Her last Pap smear has been negative I told the patient that giving her intravenous iron will not fix the underlying problem which is menorrhagia I recommend GYN evaluation for further management   Orders Placed This Encounter  Procedures  . Ferritin    Standing Status:   Future    Standing Expiration Date:   01/13/2019  . Iron and TIBC    Standing Status:   Future    Standing Expiration Date:   02/17/2019  . CBC with Differential/Platelet    Standing Status:   Future    Standing Expiration Date:   02/17/2019    INTERVAL HISTORY: Nicole Nicholson 37 y.o. female returns for further intravenous iron She continues to have intermittent menorrhagia The patient denies any recent signs or symptoms of bleeding such as spontaneous epistaxis, hematuria or  hematochezia. She tolerated the infusion iron well without major side effects SUMMARY OF HEMATOLOGIC HISTORY:  Nicole Nicholson is seen here for severe symptomatic anemia and severe fatigue  She was found to have abnormal CBC from recent ER/urgent care visit. She has "anemia all her life". She had multiple pregnancies in the past and was told she was anemic She denies recent chest pain on exertion, but does have shortness of breath on minimal exertion, pre-syncopal episodes, or palpitations. She had not noticed any recent bleeding such as epistaxis, hematuria or hematochezia The patient has occasional over the counter NSAID ingestion. She is not on antiplatelets agents.  She had no prior history or diagnosis of cancer. Her age appropriate screening programs are up-to-date. She complained of pica but eats a variety of diet. She never donated blood or received blood transfusion The patient was prescribed oral iron supplements and she takes 1 daily but tolerated that poorly. She has excessive menorrhagia monthly and was told she has fibroids/ovarian cysts. She had thyroid surgery in the past and complained of chronic fatigue She received multiple doses of IV iron in 2016 & 2017 and March 2018 with improvement of energy level She had EGD and colonoscopy in 2018 which showed no evidence of bleeding lesions  I have reviewed the past medical history, past surgical history, social history and family history with the patient and they are unchanged from previous note.  ALLERGIES:  is allergic to codeine; darvocet [propoxyphene n-acetaminophen]; percocet [oxycodone-acetaminophen]; tylenol [acetaminophen]; and mirabegron.  MEDICATIONS:  Current Outpatient Medications  Medication Sig Dispense Refill  . ALPRAZolam (XANAX) 0.5 MG tablet  TK 1 T PO  Q 8 H PRF ANXIETY  1  . diclofenac (VOLTAREN) 50 MG EC tablet TK 1 T PO TID PRN  1  . DULoxetine (CYMBALTA) 30 MG capsule TK ONE C PO D  3  . furosemide  (LASIX) 40 MG tablet TK 1 T PO  every other day  1  . hydrOXYzine (ATARAX/VISTARIL) 25 MG tablet TK 1 T PO Q 4-6 H PRF ITCHINESS  1  . loratadine (CLARITIN) 10 MG tablet TK 1 T PO D daily  1  . NATURE-THROID 97.5 MG TABS TK 1 AND 1/2 TS PO QD  11  . ondansetron (ZOFRAN-ODT) 8 MG disintegrating tablet DIS 1 T ON THE TONGUE Q 8 H PRN  2  . promethazine (PHENERGAN) 50 MG tablet TK 1 T PO  Q 6 H PRN  1  . traMADol (ULTRAM) 50 MG tablet TK 1 T PO QID PRN P  0  . Vitamin D, Ergocalciferol, (DRISDOL) 50000 units CAPS capsule Take 50,000 Units by mouth every 7 (seven) days.      Current Facility-Administered Medications  Medication Dose Route Frequency Provider Last Rate Last Dose  . 0.9 %  sodium chloride infusion  500 mL Intravenous Continuous Irene Shipper, MD         REVIEW OF SYSTEMS:   Constitutional: Denies fevers, chills or night sweats Eyes: Denies blurriness of vision Ears, nose, mouth, throat, and face: Denies mucositis or sore throat Respiratory: Denies cough, dyspnea or wheezes Cardiovascular: Denies palpitation, chest discomfort or lower extremity swelling Gastrointestinal:  Denies nausea, heartburn or change in bowel habits Skin: Denies abnormal skin rashes Lymphatics: Denies new lymphadenopathy or easy bruising Neurological:Denies numbness, tingling or new weaknesses Behavioral/Psych: Mood is stable, no new changes  All other systems were reviewed with the patient and are negative.  PHYSICAL EXAMINATION: ECOG PERFORMANCE STATUS: 0 - Asymptomatic  Vitals:   01/13/18 1413  BP: (!) 107/55  Pulse: 73  Resp: 18  Temp: 98.4 F (36.9 C)  SpO2: 100%   Filed Weights   01/13/18 1413  Weight: 245 lb 3.2 oz (111.2 kg)    GENERAL:alert, no distress and comfortable SKIN: skin color, texture, turgor are normal, no rashes or significant lesions EYES: normal, Conjunctiva are pink and non-injected, sclera clear OROPHARYNX:no exudate, no erythema and lips, buccal mucosa, and  tongue normal  NECK: supple, thyroid normal size, non-tender, without nodularity LYMPH:  no palpable lymphadenopathy in the cervical, axillary or inguinal LUNGS: clear to auscultation and percussion with normal breathing effort HEART: regular rate & rhythm and no murmurs and no lower extremity edema ABDOMEN:abdomen soft, non-tender and normal bowel sounds Musculoskeletal:no cyanosis of digits and no clubbing  NEURO: alert & oriented x 3 with fluent speech, no focal motor/sensory deficits  LABORATORY DATA:  I have reviewed the data as listed     Component Value Date/Time   NA 139 02/13/2015 1815   K 3.7 02/13/2015 1815   CL 104 02/13/2015 1815   CO2 28 02/13/2015 1815   GLUCOSE 84 02/13/2015 1815   BUN 12 02/13/2015 1815   CREATININE 0.92 02/13/2015 1815   CALCIUM 9.2 02/13/2015 1815   PROT 7.6 11/13/2014 0720   ALBUMIN 3.5 11/13/2014 0720   AST 21 11/13/2014 0720   ALT 12 (L) 11/13/2014 0720   ALKPHOS 54 11/13/2014 0720   BILITOT 0.1 (L) 11/13/2014 0720   GFRNONAA >60 02/13/2015 1815   GFRAA >60 02/13/2015 1815    No results found for: SPEP,  UPEP  Lab Results  Component Value Date   WBC 5.1 01/02/2018   NEUTROABS 2.8 01/02/2018   HGB 10.9 (L) 01/02/2018   HCT 33.9 (L) 01/02/2018   MCV 84.1 01/02/2018   PLT 305 01/02/2018      Chemistry      Component Value Date/Time   NA 139 02/13/2015 1815   K 3.7 02/13/2015 1815   CL 104 02/13/2015 1815   CO2 28 02/13/2015 1815   BUN 12 02/13/2015 1815   CREATININE 0.92 02/13/2015 1815      Component Value Date/Time   CALCIUM 9.2 02/13/2015 1815   ALKPHOS 54 11/13/2014 0720   AST 21 11/13/2014 0720   ALT 12 (L) 11/13/2014 0720   BILITOT 0.1 (L) 11/13/2014 0720      I spent 10 minutes counseling the patient face to face. The total time spent in the appointment was 15 minutes and more than 50% was on counseling.   All questions were answered. The patient knows to call the clinic with any problems, questions or  concerns. No barriers to learning was detected.    Heath Lark, MD 8/9/20192:27 PM

## 2018-01-13 NOTE — Patient Instructions (Signed)

## 2018-01-13 NOTE — Progress Notes (Signed)
Pt declined to stay 30 min post feraheme infusion. Vital signs stable and pt denies any symptoms/issues.

## 2018-01-13 NOTE — Assessment & Plan Note (Signed)
The patient had extensive workup including negative EGD and colonoscopy in 2018 The most likely cause of her anemia is due to chronic blood loss/malabsorption syndrome. We discussed some of the risks, benefits, and alternatives of intravenous iron infusions. The patient is symptomatic from anemia and the iron level is critically low. She tolerated oral iron supplement poorly and desires to achieved higher levels of iron faster for adequate hematopoesis. Some of the side-effects to be expected including risks of infusion reactions, phlebitis, headaches, nausea and fatigue.  The patient is willing to proceed. Patient education material was dispensed.  Goal is to keep ferritin level greater than 50 and resolution of anemia I suspect her because of iron deficiency anemia is due to chronic menorrhagia She is currently undergoing evaluation by gynecologist and I would defer to them for further management. The patient has declined home infusion

## 2018-01-20 ENCOUNTER — Inpatient Hospital Stay: Payer: Medicaid Other

## 2018-01-20 VITALS — BP 117/70 | HR 68 | Temp 98.4°F | Resp 17

## 2018-01-20 DIAGNOSIS — D509 Iron deficiency anemia, unspecified: Secondary | ICD-10-CM

## 2018-01-20 MED ORDER — FERUMOXYTOL INJECTION 510 MG/17 ML
510.0000 mg | Freq: Once | INTRAVENOUS | Status: AC
  Administered 2018-01-20: 510 mg via INTRAVENOUS
  Filled 2018-01-20: qty 17

## 2018-01-20 NOTE — Patient Instructions (Signed)

## 2018-04-14 ENCOUNTER — Inpatient Hospital Stay: Payer: Medicaid Other | Attending: Hematology and Oncology

## 2018-04-14 DIAGNOSIS — D500 Iron deficiency anemia secondary to blood loss (chronic): Secondary | ICD-10-CM | POA: Diagnosis present

## 2018-04-14 DIAGNOSIS — N920 Excessive and frequent menstruation with regular cycle: Secondary | ICD-10-CM | POA: Diagnosis not present

## 2018-04-14 DIAGNOSIS — D509 Iron deficiency anemia, unspecified: Secondary | ICD-10-CM

## 2018-04-14 LAB — CBC WITH DIFFERENTIAL/PLATELET
ABS IMMATURE GRANULOCYTES: 0.02 10*3/uL (ref 0.00–0.07)
BASOS ABS: 0 10*3/uL (ref 0.0–0.1)
Basophils Relative: 1 %
Eosinophils Absolute: 0.1 10*3/uL (ref 0.0–0.5)
Eosinophils Relative: 2 %
HEMATOCRIT: 37.9 % (ref 36.0–46.0)
HEMOGLOBIN: 12.4 g/dL (ref 12.0–15.0)
Immature Granulocytes: 0 %
LYMPHS ABS: 1.7 10*3/uL (ref 0.7–4.0)
LYMPHS PCT: 31 %
MCH: 29 pg (ref 26.0–34.0)
MCHC: 32.7 g/dL (ref 30.0–36.0)
MCV: 88.8 fL (ref 80.0–100.0)
MONOS PCT: 10 %
Monocytes Absolute: 0.6 10*3/uL (ref 0.1–1.0)
NEUTROS ABS: 3.1 10*3/uL (ref 1.7–7.7)
NEUTROS PCT: 56 %
NRBC: 0 % (ref 0.0–0.2)
Platelets: 318 10*3/uL (ref 150–400)
RBC: 4.27 MIL/uL (ref 3.87–5.11)
RDW: 15 % (ref 11.5–15.5)
WBC: 5.6 10*3/uL (ref 4.0–10.5)

## 2018-04-14 LAB — IRON AND TIBC
Iron: 34 ug/dL — ABNORMAL LOW (ref 41–142)
SATURATION RATIOS: 10 % — AB (ref 21–57)
TIBC: 330 ug/dL (ref 236–444)
UIBC: 296 ug/dL (ref 120–384)

## 2018-04-14 LAB — FERRITIN: FERRITIN: 21 ng/mL (ref 11–307)

## 2018-04-27 ENCOUNTER — Telehealth: Payer: Self-pay | Admitting: *Deleted

## 2018-04-27 NOTE — Telephone Encounter (Signed)
Val[facility] called for lab results- given to patient.  She wants to know when she is supposed to return to Dr Winfred Burn

## 2018-05-05 ENCOUNTER — Telehealth: Payer: Self-pay | Admitting: *Deleted

## 2018-05-05 NOTE — Telephone Encounter (Signed)
LM that she should have labs checked in 3 months by PCP. To call us if she has any questions.

## 2018-05-05 NOTE — Telephone Encounter (Signed)
She has not needed IV iron since August I recommend wait 3 months She can get labs checked with PCP

## 2018-06-28 ENCOUNTER — Ambulatory Visit: Payer: Self-pay | Admitting: Allergy and Immunology

## 2018-07-04 ENCOUNTER — Ambulatory Visit (INDEPENDENT_AMBULATORY_CARE_PROVIDER_SITE_OTHER): Payer: Medicaid Other | Admitting: Pediatrics

## 2018-07-04 ENCOUNTER — Encounter: Payer: Self-pay | Admitting: Pediatrics

## 2018-07-04 VITALS — BP 144/90 | HR 78 | Temp 98.3°F | Resp 16 | Ht 67.0 in | Wt 249.0 lb

## 2018-07-04 DIAGNOSIS — Z8679 Personal history of other diseases of the circulatory system: Secondary | ICD-10-CM | POA: Diagnosis not present

## 2018-07-04 DIAGNOSIS — J3089 Other allergic rhinitis: Secondary | ICD-10-CM | POA: Diagnosis not present

## 2018-07-04 DIAGNOSIS — J4530 Mild persistent asthma, uncomplicated: Secondary | ICD-10-CM | POA: Diagnosis not present

## 2018-07-04 DIAGNOSIS — L500 Allergic urticaria: Secondary | ICD-10-CM

## 2018-07-04 DIAGNOSIS — Z720 Tobacco use: Secondary | ICD-10-CM

## 2018-07-04 MED ORDER — FAMOTIDINE 20 MG PO TABS: 20.0000 mg | ORAL_TABLET | Freq: Two times a day (BID) | ORAL | 5 refills | Status: DC

## 2018-07-04 MED ORDER — EPINEPHRINE 0.3 MG/0.3ML IJ SOAJ: INTRAMUSCULAR | 2 refills | Status: AC

## 2018-07-04 MED ORDER — CETIRIZINE HCL 10 MG PO TABS: 10.0000 mg | ORAL_TABLET | Freq: Every morning | ORAL | 5 refills | Status: AC

## 2018-07-04 MED ORDER — HYDROXYZINE HCL 25 MG PO TABS: 25.0000 mg | ORAL_TABLET | Freq: Every day | ORAL | 5 refills | Status: AC

## 2018-07-04 MED ORDER — MONTELUKAST SODIUM 10 MG PO TABS: ORAL_TABLET | ORAL | 5 refills | Status: DC

## 2018-07-04 NOTE — Patient Instructions (Addendum)
Environmental control of dust and mold Cetirizine 10 mg in the morning and hydroxyzine 25 mg at night for itching Pepcid 20 mg-take 1 tablet twice a day.  It may help itching Montelukast 10 mg-take 1 tablet once a day prevent cough or wheeze Pro-air 2 puffs every 4 hours if needed for wheezing or coughing spells Add prednisone 20 mg twice a day for 3 days, 20 mg in the fourth day, 10 mg in the fifth day to bring your hives under control.  Did  prednisone helped the itching ? Stop smoking cigarettes Try a  Salicylate free  diet since you have had some itching in the past from tomatoes and pineapple , alcohol and red dyes Stop smoking cigarettes Continue on your other medication Call us if you are not doing well on this treatment plan  If you have an allergic reaction take Benadryl 50 mg every 4 hours and if you have life-threatening symptoms inject with EpiPen 0.3 mg.  Then write what you had to eat or drink in the previous 4 hours

## 2018-07-05 DIAGNOSIS — Z720 Tobacco use: Secondary | ICD-10-CM | POA: Insufficient documentation

## 2018-07-05 DIAGNOSIS — J3089 Other allergic rhinitis: Secondary | ICD-10-CM | POA: Insufficient documentation

## 2018-07-05 DIAGNOSIS — J4530 Mild persistent asthma, uncomplicated: Secondary | ICD-10-CM | POA: Insufficient documentation

## 2018-07-05 DIAGNOSIS — Z8679 Personal history of other diseases of the circulatory system: Secondary | ICD-10-CM | POA: Insufficient documentation

## 2018-07-05 DIAGNOSIS — L509 Urticaria, unspecified: Secondary | ICD-10-CM | POA: Insufficient documentation

## 2018-07-05 NOTE — Progress Notes (Signed)
[address] [place] [address] Dept: [phone]  New Patient Note  Patient ID: Nicole Nicholson, female    DOB: 10-26-1980  Age: 38 y.o. MRN: [id number] Date of Office Visit: 07/04/2018 Referring provider: Rocky Morel, MD [place], [place] [address]    Chief Complaint: Pruritus; Urticaria; Nausea; and Itchy Eye  HPI Nicole Nicholson presents for evaluation of an itchy rash for over 3 months.  She has had daily itching.  She has had itching of her eyes.  During one  of these itching episodes she had some swelling of her throat.  She noted some improvement with steroid injections and prednisone.  In November she had back-to-back steroid injections about 2 weeks apart She has had asthma since 2015.  She has nasal congestion on exposure to dust, cigarette smoke and weather changes.  She has had some sneezing.  Since childhood she has had some itching when she eats tomatoes, pineapples or drinks alcohol or drinks with red or yellow dye.  She has not had lip swelling.  She has not had eczema She had thyroid nodules and a thyroidectomy in 2015.  She has pulmonary hypertension manifested by edema on the left side of her body.  She has vitamin D deficiency. She has not had tick bites.  Review of Systems  Constitutional: Negative.   HENT:       Seasonal allergic rhinitis  Eyes:       Itch at times  Respiratory:       Asthma since 2015  Cardiovascular:       Pulmonary hypertension and edema of the left side of her body  Gastrointestinal:       Occasional heartburn.  Removal of colon polyp in 2018  Genitourinary:       Benign nodule removed  from 1 breast  Musculoskeletal: Positive for myalgias.  Skin:       Hives for 3 months.  History of itching since childhood when she eats tomato, pineapple, drinks alcohol or drinks soda with red dye  Neurological: Negative.   Endo/Heme/Allergies:       No diabetes.  Thyroidectomy for goiter and thyroid nodules.  Iron  infusions for 2 years.  Vitamin D deficiency  Psychiatric/Behavioral: Negative.     Outpatient Encounter Medications as of 07/04/2018  Medication Sig  . albuterol (PROVENTIL HFA;VENTOLIN HFA) 108 (90 Base) MCG/ACT inhaler INL 2 PFS PO QID PRN  . clotrimazole-betamethasone (LOTRISONE) cream APP EXT AA BID  . cyclobenzaprine (FLEXERIL) 10 MG tablet TK 1 T PO TID PRF MUSCLE PAIN  . diclofenac (VOLTAREN) 50 MG EC tablet TK 1 T PO TID PRN  . etonogestrel-ethinyl estradiol (NUVARING) 0.12-0.015 MG/24HR vaginal ring Insert into vagina for 21 days, then remove for  Seven days and replace with new ring  . furosemide (LASIX) 40 MG tablet TK 1 T PO  every other day  . hydrOXYzine (ATARAX/VISTARIL) 25 MG tablet Take 1 tablet (25 mg total) by mouth at bedtime.  Marland Kitchen loratadine (CLARITIN) 10 MG tablet TK 1 T PO D daily  . NATURE-THROID 97.5 MG TABS TK 1 AND 1/2 TS PO QD  . ondansetron (ZOFRAN-ODT) 8 MG disintegrating tablet DIS 1 T ON THE TONGUE Q 8 H PRN  . Vitamin D, Ergocalciferol, (DRISDOL) 50000 units CAPS capsule Take 50,000 Units by mouth every 7 (seven) days.   . [DISCONTINUED] diclofenac (VOLTAREN) 50 MG EC tablet TK 1 T PO TID PRN  . [DISCONTINUED] hydrOXYzine (ATARAX/VISTARIL) 25 MG tablet TK  1 T PO Q 4-6 H PRF ITCHINESS  . [DISCONTINUED] Vitamin D, Ergocalciferol, (DRISDOL) 1.25 MG (50000 UT) CAPS capsule Take by mouth.  . cetirizine (ZYRTEC) 10 MG tablet Take 1 tablet (10 mg total) by mouth every morning.  Marland Kitchen EPINEPHrine (EPIPEN 2-PAK) 0.3 mg/0.3 mL IJ SOAJ injection Use as directed for severe allergic reactions  . famotidine (PEPCID) 20 MG tablet Take 1 tablet (20 mg total) by mouth 2 (two) times daily.  . montelukast (SINGULAIR) 10 MG tablet Take 1 tablet once a day to prevent cough or wheeze  . [DISCONTINUED] ALPRAZolam (XANAX) 0.5 MG tablet TK 1 T PO  Q 8 H PRF ANXIETY  . [DISCONTINUED] DULoxetine (CYMBALTA) 30 MG capsule TK ONE C PO D  . [DISCONTINUED] promethazine (PHENERGAN) 50 MG tablet  TK 1 T PO  Q 6 H PRN  . [DISCONTINUED] traMADol (ULTRAM) 50 MG tablet TK 1 T PO QID PRN P   Facility-Administered Encounter Medications as of 07/04/2018  Medication  . 0.9 %  sodium chloride infusion     Drug Allergies:  Allergies  Allergen Reactions  . Codeine Anaphylaxis    Allergic to TYLENOL # 3  . Darvocet [Propoxyphene N-Acetaminophen] Anaphylaxis  . Percocet [Oxycodone-Acetaminophen] Anaphylaxis  . Tylenol [Acetaminophen] Anaphylaxis    Allergic to TYLENOL # 3  . Mirabegron Itching    Family History: Keiara's family history includes Allergic rhinitis in her daughter; Aneurysm in her maternal aunt; Asthma in her daughter; Bronchitis in her daughter; Diabetes in her paternal grandmother; Eczema in her daughter; Food Allergy in her daughter; Heart disease in her maternal grandmother; Pancreatic cancer in her maternal grandfather; Stroke in her maternal aunt; Thyroid disease in her maternal grandfather; Urticaria in her daughter..  Family history is positive for asthma, hayfever, sinus problems, eczema, chronic urticaria, food allergies.  Family history is negative for angioedema , lupus chronic bronchitis or emphysema.  Social and environmental.  There are no pets in the home.  She has been smoking cigarettes since age 89 averaging 1 pack/day.  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  Constitutional:      Appearance: Normal appearance. She is obese.  HENT:     Head:     Comments: Eyes normal.  Ears normal.  Nose mild swelling of the nasal turbinates.  Pharynx normal. Neck:     Musculoskeletal: Neck supple.     Comments: No thyromegaly Cardiovascular:     Rate and Rhythm: Normal rate and regular rhythm.     Comments: S1-S2 normal.  She had a grade 2/6 systolic ejection  murmur best heard in the aortic valve area Pulmonary:     Comments: Clear to percussion and auscultation Abdominal:     Palpations: Abdomen is soft.     Tenderness: There is no abdominal tenderness.     Comments: No hepatosplenomegaly  Lymphadenopathy:     Cervical: No cervical adenopathy.  Skin:    Comments: Clear but dermographia noted  Neurological:     General: No focal deficit present.     Mental Status: She is alert and oriented to person, place, and time.  Psychiatric:        Mood and  Affect: Mood normal.        Behavior: Behavior normal.        Thought Content: Thought content normal.        Judgment: Judgment normal.     Diagnostics: Allergy skin test were positive to grass pollens and some molds.  Skin testing to foods was negative  FVC 3.49 L FEV1 2.98 L.  Addicted FVC 3.43 L predicted FEV1 2.83 L.  After albuterol 2 puffs FVC 3.45 L FEV1 3.08 L-the spirometry is in the normal range and there was no significant improvement after albuterol   Assessment  Assessment and Plan: 1. Mild persistent asthma without complication   2. Other allergic rhinitis   3. Allergic urticaria   4. History of pulmonary hypertension   5. Tobacco use     Meds ordered this encounter  Medications  . EPINEPHrine (EPIPEN 2-PAK) 0.3 mg/0.3 mL IJ SOAJ injection    Sig: Use as directed for severe allergic reactions    Dispense:  2 Device    Refill:  2  . montelukast (SINGULAIR) 10 MG tablet    Sig: Take 1 tablet once a day to prevent cough or wheeze    Dispense:  34 tablet    Refill:  5  . famotidine (PEPCID) 20 MG tablet    Sig: Take 1 tablet (20 mg total) by mouth 2 (two) times daily.    Dispense:  60 tablet    Refill:  5  . cetirizine (ZYRTEC) 10 MG tablet    Sig: Take 1 tablet (10 mg total) by mouth every morning.    Dispense:  34 tablet    Refill:  5  . hydrOXYzine (ATARAX/VISTARIL) 25 MG tablet    Sig: Take 1 tablet (25 mg total) by mouth at bedtime.    Dispense:  34 tablet      Refill:  5    Patient Instructions  Environmental control of dust and mold Cetirizine 10 mg in the morning and hydroxyzine 25 mg at night for itching Pepcid 20 mg-take 1 tablet twice a day.  It may help itching Montelukast 10 mg-take 1 tablet once a day prevent cough or wheeze Pro-air 2 puffs every 4 hours if needed for wheezing or coughing spells Add prednisone 20 mg twice a day for 3 days, 20 mg in the fourth day, 10 mg in the fifth day to bring your hives under control.  Did  prednisone helped the itching ? Stop smoking cigarettes Try a  Salicylate free  diet since you have had some itching in the past from tomatoes and pineapple , alcohol and red dyes Stop smoking cigarettes Continue on your other medication Call us if you are not doing well on this treatment plan  If you have an allergic reaction take Benadryl 50 mg every 4 hours and if you have life-threatening symptoms inject with EpiPen 0.3 mg.  Then write what you had to eat or drink in the previous 4 hours   Return in about 2 weeks (around 07/18/2018).   Thank you for the opportunity to care for this patient.  Please do not hesitate to contact me with questions.  Penne Lash, M.D.  [facility] [address] [place], [place] [address] [phone]

## 2018-07-21 ENCOUNTER — Ambulatory Visit: Payer: Medicaid Other | Admitting: Allergy

## 2018-07-28 ENCOUNTER — Ambulatory Visit: Payer: Medicaid Other | Admitting: Allergy

## 2018-08-01 ENCOUNTER — Encounter: Payer: Self-pay | Admitting: Internal Medicine

## 2018-08-11 ENCOUNTER — Ambulatory Visit (INDEPENDENT_AMBULATORY_CARE_PROVIDER_SITE_OTHER): Payer: Medicaid Other | Admitting: Allergy

## 2018-08-11 ENCOUNTER — Encounter: Payer: Self-pay | Admitting: Allergy

## 2018-08-11 VITALS — BP 124/84 | HR 88 | Temp 97.9°F | Resp 16

## 2018-08-11 DIAGNOSIS — Z720 Tobacco use: Secondary | ICD-10-CM | POA: Diagnosis not present

## 2018-08-11 DIAGNOSIS — J4530 Mild persistent asthma, uncomplicated: Secondary | ICD-10-CM | POA: Diagnosis not present

## 2018-08-11 DIAGNOSIS — J3089 Other allergic rhinitis: Secondary | ICD-10-CM

## 2018-08-11 DIAGNOSIS — L509 Urticaria, unspecified: Secondary | ICD-10-CM

## 2018-08-11 MED ORDER — BECLOMETHASONE DIPROP HFA 80 MCG/ACT IN AERB: 2.0000 | INHALATION_SPRAY | Freq: Two times a day (BID) | RESPIRATORY_TRACT | 2 refills | Status: DC

## 2018-08-11 NOTE — Assessment & Plan Note (Addendum)
Shortness of breath and some wheezing/coughing the last few weeks. Using albuterol 2-3 times a day with minimal benefit. No fevers or chills or URI symptoms. Patient does have history of anemia and pulmonary hypertension. Still smoking 1/2 pack per day.  Today's spirometry was normal.  Discussed with patient that shortness of breath can have multiple etiologies. Recommend that she follows up with PCP as scheduled in 2 weeks to get blood count rechecked. . Daily controller medication(s): will do a trial of Qvar 80 2 puffs twice a day and rinse mouth afterwards for 1 month and let me know if it helps.  o Continue montelukast 10mg  daily. . Prior to physical activity: May use albuterol rescue inhaler 2 puffs 5 to 15 minutes prior to strenuous physical activities. Marland Kitchen Rescue medications: May use albuterol rescue inhaler 2 puffs or nebulizer every 4 to 6 hours as needed for shortness of breath, chest tightness, coughing, and wheezing. Monitor frequency of use.  . If symptoms persistent at next visit will get CXR.

## 2018-08-11 NOTE — Assessment & Plan Note (Addendum)
2020 skin testing was positive to grass and mold. Stable.  Continue zyrtec 10mg  daily.  Continue environmental control measures.

## 2018-08-11 NOTE — Assessment & Plan Note (Signed)
Resolved after prednisone course. But still gets some pruritus especially after alcohol use. Marland Kitchen Avoid the following potential triggers: alcohol, tight clothing, NSAIDs.  . Continue zyrtec 10mg  daily and hydroxyzine 25mg  at night.

## 2018-08-11 NOTE — Progress Notes (Signed)
Follow Up Note  RE: Nicole Nicholson MRN: [id number] DOB: March 10, 1981 Date of Office Visit: 08/11/2018  Referring provider: [name],* Primary care provider: [name], MD  Chief Complaint: Asthma  History of Present Illness: I had the pleasure of seeing Nicole Nicholson for a follow up visit at the [facility] on 08/11/2018. She is a 38 y.o. female, who is being followed for asthma, allergic rhinitis, urticaria. Today she is here for regular follow up visit and new complaint of shortness of breath. Her previous allergy office visit was on 07/04/2018 with Dr. Shaune Leeks.   Asthma: Patient has been complaining of shortness of breath and can't catch her breath. Some wheezing and coughing as well. Coughing is worse in the morning and when she is trying to go to sleep.  She has been using her rescue inhaler 2-3 times a day the past week with no real benefit. Using Singulair on and off.  Denies any fevers or chills.  Anemia is under control and follows with heme/onc. Last CBC 3 months ago was normal.  She was diagnosed with pulmonary hypertension and follows with cardiology once a year. Swelling is at baseline.   No recent CXR.   Still smoking 1/2 pack per day and finds it difficult to decrease use.   Allergic rhinitis: Currently having itchy/burning eyes.  Taking zyrtec 10mg  daily and hydroxyzine 25mg  at night for itching with good benefit.  Stopped Pepcid as it was making her baseous.  Pruritus: Alcohol tends to cause some pruritus. No more hives/rash since the last visit.   Assessment and Plan: Nicole Nicholson is a 38 y.o. female with: Mild persistent asthma without complication Shortness of breath and some wheezing/coughing the last few weeks. Using albuterol 2-3 times a day with minimal benefit. No fevers or chills or URI symptoms. Patient does have history of anemia and pulmonary hypertension. Still smoking 1/2 pack per day.  Today's spirometry  was normal.  Discussed with patient that shortness of breath can have multiple etiologies. Recommend that she follows up with PCP as scheduled in 2 weeks to get blood count rechecked. . Daily controller medication(s): will do a trial of Qvar 80 2 puffs twice a day and rinse mouth afterwards for 1 month and let me know if it helps.  o Continue montelukast 10mg  daily. . Prior to physical activity: May use albuterol rescue inhaler 2 puffs 5 to 15 minutes prior to strenuous physical activities. Marland Kitchen Rescue medications: May use albuterol rescue inhaler 2 puffs or nebulizer every 4 to 6 hours as needed for shortness of breath, chest tightness, coughing, and wheezing. Monitor frequency of use.  . If symptoms persistent at next visit will get CXR.  Other allergic rhinitis 2020 skin testing was positive to grass and mold. Stable.  Continue zyrtec 10mg  daily.  Continue environmental control measures.   Urticaria Resolved after prednisone course. But still gets some pruritus especially after alcohol use. Marland Kitchen Avoid the following potential triggers: alcohol, tight clothing, NSAIDs.  . Continue zyrtec 10mg  daily and hydroxyzine 25mg  at night.   Tobacco use Discussed smoking cessation.   Return in about 2 months (around 10/11/2018).  Meds ordered this encounter  Medications  . beclomethasone (QVAR REDIHALER) 80 MCG/ACT inhaler    Sig: Inhale 2 puffs into the lungs 2 (two) times daily. Rinse, gargle and spit out after use    Dispense:  1 Inhaler    Refill:  2   Diagnostics: Spirometry:  Tracings reviewed. Her effort: Good reproducible  efforts. FVC: 3.49L FEV1: 2.89L, 102% predicted FEV1/FVC ratio: 83% Interpretation: Spirometry consistent with normal pattern.  Please see scanned spirometry results for details.  Medication List:  Current Outpatient Medications  Medication Sig Dispense Refill  . albuterol (PROVENTIL HFA;VENTOLIN HFA) 108 (90 Base) MCG/ACT inhaler INL 2 PFS PO QID PRN    .  ALPRAZolam (XANAX) 0.5 MG tablet TK 1 T PO TWO TIMES DAILY AS NEED    . cetirizine (ZYRTEC) 10 MG tablet Take 1 tablet (10 mg total) by mouth every morning. (Patient taking differently: Take 10 mg by mouth daily as needed. ) 34 tablet 5  . clindamycin (CLINDAGEL) 1 % gel APP EXT AA BID    . clotrimazole-betamethasone (LOTRISONE) cream APP EXT AA BID    . cyclobenzaprine (FLEXERIL) 10 MG tablet as needed.     Marland Kitchen EPINEPHrine (EPIPEN 2-PAK) 0.3 mg/0.3 mL IJ SOAJ injection Use as directed for severe allergic reactions 2 Device 2  . etonogestrel-ethinyl estradiol (NUVARING) 0.12-0.015 MG/24HR vaginal ring Insert into vagina for 21 days, then remove for  Seven days and replace with new ring    . furosemide (LASIX) 40 MG tablet TK 1 T PO  every other day  1  . hydrOXYzine (ATARAX/VISTARIL) 25 MG tablet Take 1 tablet (25 mg total) by mouth at bedtime. 34 tablet 5  . montelukast (SINGULAIR) 10 MG tablet Take 1 tablet once a day to prevent cough or wheeze 34 tablet 5  . NATURE-THROID 97.5 MG TABS TK 1 AND 1/2 TS PO QD  11  . ondansetron (ZOFRAN-ODT) 8 MG disintegrating tablet DIS 1 T ON THE TONGUE Q 8 H PRN  2  . traMADol (ULTRAM) 50 MG tablet TAKE ONE TO TWO TABLETS BY MOUTH FOUR TIMES DAILY AS NEEDED    . Vitamin D, Ergocalciferol, (DRISDOL) 50000 units CAPS capsule Take 50,000 Units by mouth every 7 (seven) days.     . beclomethasone (QVAR REDIHALER) 80 MCG/ACT inhaler Inhale 2 puffs into the lungs 2 (two) times daily. Rinse, gargle and spit out after use 1 Inhaler 2  . diclofenac (VOLTAREN) 50 MG EC tablet TK 1 T PO TID PRN  1   Current Facility-Administered Medications  Medication Dose Route Frequency Provider Last Rate Last Dose  . 0.9 %  sodium chloride infusion  500 mL Intravenous Continuous Irene Shipper, MD       Allergies: Allergies  Allergen Reactions  . Codeine Anaphylaxis    Allergic to TYLENOL # 3  . Darvocet [Propoxyphene N-Acetaminophen] Anaphylaxis  . Percocet  [Oxycodone-Acetaminophen] Anaphylaxis  . Tylenol [Acetaminophen] Anaphylaxis    Allergic to TYLENOL # 3  . Mirabegron Itching   I reviewed her past medical history, social history, family history, and environmental history and no significant changes have been reported from previous visit on 07/04/2018.  Review of Systems  Constitutional: Negative for appetite change, chills, fever and unexpected weight change.  HENT: Negative for congestion and rhinorrhea.   Eyes: Negative for itching.  Respiratory: Positive for cough and shortness of breath. Negative for chest tightness and wheezing.   Gastrointestinal: Negative for abdominal pain.  Skin: Negative for rash.  Allergic/Immunologic: Positive for environmental allergies.  Neurological: Negative for headaches.   Objective: BP 124/84   Pulse 88   Temp 97.9 F (36.6 C) (Oral)   Resp 16   SpO2 98%  There is no height or weight on file to calculate BMI. Physical Exam  Constitutional: She is oriented to person, place, and time. She appears  well-developed and well-nourished.  HENT:  Head: Normocephalic and atraumatic.  Right Ear: External ear normal.  Left Ear: External ear normal.  Nose: Nose normal.  Mouth/Throat: Oropharynx is clear and moist.  Eyes: Conjunctivae and EOM are normal.  Neck: Neck supple.  Cardiovascular: Normal rate, regular rhythm and normal heart sounds. Exam reveals no gallop and no friction rub.  No murmur heard. Pulmonary/Chest: Effort normal and breath sounds normal. She has no wheezes. She has no rales.  Lymphadenopathy:    She has no cervical adenopathy.  Neurological: She is alert and oriented to person, place, and time.  Skin: Skin is warm. No rash noted.  Psychiatric: She has a normal mood and affect. Her behavior is normal.  Nursing note and vitals reviewed.  Previous notes and tests were reviewed. The plan was reviewed with the patient/family, and all questions/concerned were addressed.  It was my  pleasure to see Nicole Nicholson today and participate in her care. Please feel free to contact me with any questions or concerns.  Sincerely,  Rexene Alberts, DO Allergy & Immunology  [facility] office: [phone] [facility] office: [phone]

## 2018-08-11 NOTE — Assessment & Plan Note (Signed)
   Discussed smoking cessation. 

## 2018-08-11 NOTE — Patient Instructions (Addendum)
.   Daily controller medication(s): start Qvar 80 2 puffs twice a day and rinse mouth afterwards for 1 month and let me know if it helps.  o Continue montelukast 10mg  daily. . Prior to physical activity: May use albuterol rescue inhaler 2 puffs 5 to 15 minutes prior to strenuous physical activities. Marland Kitchen Rescue medications: May use albuterol rescue inhaler 2 puffs or nebulizer every 4 to 6 hours as needed for shortness of breath, chest tightness, coughing, and wheezing. Monitor frequency of use.  . Asthma control goals:  o Full participation in all desired activities (may need albuterol before activity) o Albuterol use two times or less a week on average (not counting use with activity) o Cough interfering with sleep two times or less a month o Oral steroids no more than once a year o No hospitalizations   Decrease smoking . Avoid the following potential triggers: alcohol, tight clothing, NSAIDs.  . Continue zyrtec 10mg  daily and hydroxyzine 25mg  at night  Follow up in 6-8 weeks

## 2018-08-14 ENCOUNTER — Telehealth: Payer: Self-pay | Admitting: Allergy

## 2018-08-14 MED ORDER — FLUTICASONE PROPIONATE HFA 110 MCG/ACT IN AERO: 2.0000 | INHALATION_SPRAY | Freq: Two times a day (BID) | RESPIRATORY_TRACT | 2 refills | Status: DC

## 2018-08-14 NOTE — Telephone Encounter (Signed)
Left message for patient that we sent in a prescription for the Flovent.

## 2018-08-14 NOTE — Telephone Encounter (Signed)
Dr. Maudie Mercury insurance will not pay for Q-Var. Will cover Flovent. Please advise. Thank you.

## 2018-08-14 NOTE — Telephone Encounter (Signed)
Please call patient.  I sent in Flovent to use. 2 puffs twice a day.

## 2018-09-11 ENCOUNTER — Other Ambulatory Visit: Payer: Self-pay | Admitting: Hematology and Oncology

## 2018-09-11 ENCOUNTER — Encounter: Payer: Self-pay | Admitting: Hematology and Oncology

## 2018-09-13 ENCOUNTER — Telehealth: Payer: Self-pay | Admitting: Hematology and Oncology

## 2018-09-13 NOTE — Telephone Encounter (Signed)
Scheduled appt per 4/06 sch message - unable to reach patient . Left message with appt date and time

## 2018-09-15 ENCOUNTER — Inpatient Hospital Stay: Payer: Medicaid Other | Attending: Hematology and Oncology | Admitting: Hematology and Oncology

## 2018-09-15 ENCOUNTER — Other Ambulatory Visit: Payer: Self-pay

## 2018-09-15 ENCOUNTER — Encounter: Payer: Self-pay | Admitting: Hematology and Oncology

## 2018-09-15 ENCOUNTER — Inpatient Hospital Stay: Payer: Medicaid Other

## 2018-09-15 VITALS — BP 133/79 | HR 72 | Temp 98.4°F | Resp 17

## 2018-09-15 DIAGNOSIS — D509 Iron deficiency anemia, unspecified: Secondary | ICD-10-CM

## 2018-09-15 DIAGNOSIS — D500 Iron deficiency anemia secondary to blood loss (chronic): Secondary | ICD-10-CM | POA: Diagnosis not present

## 2018-09-15 DIAGNOSIS — N920 Excessive and frequent menstruation with regular cycle: Secondary | ICD-10-CM | POA: Diagnosis not present

## 2018-09-15 MED ORDER — SODIUM CHLORIDE 0.9 % IV SOLN
Freq: Once | INTRAVENOUS | Status: AC
  Administered 2018-09-15: 11:00:00 via INTRAVENOUS
  Filled 2018-09-15: qty 250

## 2018-09-15 MED ORDER — SODIUM CHLORIDE 0.9 % IV SOLN
510.0000 mg | Freq: Once | INTRAVENOUS | Status: AC
  Administered 2018-09-15: 510 mg via INTRAVENOUS
  Filled 2018-09-15: qty 17

## 2018-09-15 NOTE — Assessment & Plan Note (Signed)
The patient had extensive workup including negative EGD and colonoscopy in 2018 The most likely cause of her anemia is due to chronic blood loss/malabsorption syndrome. We discussed some of the risks, benefits, and alternatives of intravenous iron infusions. The patient is symptomatic from anemia and the iron level is critically low. She tolerated oral iron supplement poorly and desires to achieved higher levels of iron faster for adequate hematopoesis. Some of the side-effects to be expected including risks of infusion reactions, phlebitis, headaches, nausea and fatigue.  The patient is willing to proceed. Patient education material was dispensed.  Goal is to keep ferritin level greater than 50 and resolution of anemia She will get her second dose of intravenous iron I recommend her to get her a CBC and iron study recheck with the primary care doctor in 6 months and she will call if she needs further iron infusion

## 2018-09-15 NOTE — Assessment & Plan Note (Signed)
She has no further heavy menstruation Her recent evaluation by gynecologist and endometrial biopsy came back benign

## 2018-09-15 NOTE — Patient Instructions (Signed)

## 2018-09-15 NOTE — Progress Notes (Signed)
[facility] OFFICE PROGRESS NOTE  [name], MD  ASSESSMENT & PLAN:  Iron deficiency anemia The patient had extensive workup including negative EGD and colonoscopy in 2018 The most likely cause of her anemia is due to chronic blood loss/malabsorption syndrome. We discussed some of the risks, benefits, and alternatives of intravenous iron infusions. The patient is symptomatic from anemia and the iron level is critically low. She tolerated oral iron supplement poorly and desires to achieved higher levels of iron faster for adequate hematopoesis. Some of the side-effects to be expected including risks of infusion reactions, phlebitis, headaches, nausea and fatigue.  The patient is willing to proceed. Patient education material was dispensed.  Goal is to keep ferritin level greater than 50 and resolution of anemia She will get her second dose of intravenous iron I recommend her to get her a CBC and iron study recheck with the primary care doctor in 6 months and she will call if she needs further iron infusion  Menorrhagia with regular cycle She has no further heavy menstruation Her recent evaluation by gynecologist and endometrial biopsy came back benign   No orders of the defined types were placed in this encounter.   INTERVAL HISTORY: Nicole Nicholson 38 y.o. female returns for further intravenous iron infusion She complained of fatigue The patient denies any recent signs or symptoms of bleeding such as spontaneous epistaxis, hematuria or hematochezia. She denies further heavy menstruation since endometrial biopsy with GYN last year Her last IV iron is in August 2019; she denies infusion reaction  SUMMARY OF HEMATOLOGIC HISTORY:  Nicole Nicholson is seen here for severe symptomatic anemia and severe fatigue  She was found to have abnormal CBC from recent ER/urgent care visit. She has "anemia all her life". She had multiple pregnancies in the past and was told  she was anemic She denies recent chest pain on exertion, but does have shortness of breath on minimal exertion, pre-syncopal episodes, or palpitations. She had not noticed any recent bleeding such as epistaxis, hematuria or hematochezia The patient has occasional over the counter NSAID ingestion. She is not on antiplatelets agents.  She had no prior history or diagnosis of cancer. Her age appropriate screening programs are up-to-date. She complained of pica but eats a variety of diet. She never donated blood or received blood transfusion The patient was prescribed oral iron supplements and she takes 1 daily but tolerated that poorly. She has excessive menorrhagia monthly and was told she has fibroids/ovarian cysts. She had thyroid surgery in the past and complained of chronic fatigue She received multiple doses of IV iron in 2016 & 2017 and March 2018 with improvement of energy level She had EGD and colonoscopy in 2018 which showed no evidence of bleeding lesions She has evaluation and endometrial biopsy in 2019 that came back benign  I have reviewed the past medical history, past surgical history, social history and family history with the patient and they are unchanged from previous note.  ALLERGIES:  is allergic to codeine; darvocet [propoxyphene n-acetaminophen]; percocet [oxycodone-acetaminophen]; tylenol [acetaminophen]; and mirabegron.  MEDICATIONS:  Current Outpatient Medications  Medication Sig Dispense Refill  . albuterol (PROVENTIL HFA;VENTOLIN HFA) 108 (90 Base) MCG/ACT inhaler INL 2 PFS PO QID PRN    . ALPRAZolam (XANAX) 0.5 MG tablet TK 1 T PO TWO TIMES DAILY AS NEED    . beclomethasone (QVAR REDIHALER) 80 MCG/ACT inhaler Inhale 2 puffs into the lungs 2 (two) times daily. Rinse, gargle and spit out after use  1 Inhaler 2  . cetirizine (ZYRTEC) 10 MG tablet Take 1 tablet (10 mg total) by mouth every morning. (Patient taking differently: Take 10 mg by mouth daily as needed. ) 34  tablet 5  . clindamycin (CLINDAGEL) 1 % gel APP EXT AA BID    . clotrimazole-betamethasone (LOTRISONE) cream APP EXT AA BID    . cyclobenzaprine (FLEXERIL) 10 MG tablet as needed.     . diclofenac (VOLTAREN) 50 MG EC tablet TK 1 T PO TID PRN  1  . EPINEPHrine (EPIPEN 2-PAK) 0.3 mg/0.3 mL IJ SOAJ injection Use as directed for severe allergic reactions 2 Device 2  . etonogestrel-ethinyl estradiol (NUVARING) 0.12-0.015 MG/24HR vaginal ring Insert into vagina for 21 days, then remove for  Seven days and replace with new ring    . fluticasone (FLOVENT HFA) 110 MCG/ACT inhaler Inhale 2 puffs into the lungs 2 (two) times daily. 1 Inhaler 2  . furosemide (LASIX) 40 MG tablet TK 1 T PO  every other day  1  . hydrOXYzine (ATARAX/VISTARIL) 25 MG tablet Take 1 tablet (25 mg total) by mouth at bedtime. 34 tablet 5  . montelukast (SINGULAIR) 10 MG tablet Take 1 tablet once a day to prevent cough or wheeze 34 tablet 5  . NATURE-THROID 97.5 MG TABS TK 1 AND 1/2 TS PO QD  11  . ondansetron (ZOFRAN-ODT) 8 MG disintegrating tablet DIS 1 T ON THE TONGUE Q 8 H PRN  2  . traMADol (ULTRAM) 50 MG tablet TAKE ONE TO TWO TABLETS BY MOUTH FOUR TIMES DAILY AS NEEDED    . Vitamin D, Ergocalciferol, (DRISDOL) 50000 units CAPS capsule Take 50,000 Units by mouth every 7 (seven) days.      Current Facility-Administered Medications  Medication Dose Route Frequency Provider Last Rate Last Dose  . 0.9 %  sodium chloride infusion  500 mL Intravenous Continuous Irene Shipper, MD         REVIEW OF SYSTEMS:   Constitutional: Denies fevers, chills or night sweats Eyes: Denies blurriness of vision Ears, nose, mouth, throat, and face: Denies mucositis or sore throat Respiratory: Denies cough, dyspnea or wheezes Cardiovascular: Denies palpitation, chest discomfort or lower extremity swelling Gastrointestinal:  Denies nausea, heartburn or change in bowel habits Skin: Denies abnormal skin rashes Lymphatics: Denies new  lymphadenopathy or easy bruising Neurological:Denies numbness, tingling or new weaknesses Behavioral/Psych: Mood is stable, no new changes  All other systems were reviewed with the patient and are negative.  PHYSICAL EXAMINATION: ECOG PERFORMANCE STATUS: 1 - Symptomatic but completely ambulatory  Vitals:   09/15/18 1044  BP: 131/75  Pulse: 94  Resp: 18  Temp: 97.9 F (36.6 C)  SpO2: 100%   Filed Weights   09/15/18 1044  Weight: 243 lb 6.4 oz (110.4 kg)    GENERAL:alert, no distress and comfortable NEURO: alert & oriented x 3 with fluent speech, no focal motor/sensory deficits  LABORATORY DATA:  I have reviewed the data as listed     Component Value Date/Time   NA 139 02/13/2015 1815   K 3.7 02/13/2015 1815   CL 104 02/13/2015 1815   CO2 28 02/13/2015 1815   GLUCOSE 84 02/13/2015 1815   BUN 12 02/13/2015 1815   CREATININE 0.92 02/13/2015 1815   CALCIUM 9.2 02/13/2015 1815   PROT 7.6 11/13/2014 0720   ALBUMIN 3.5 11/13/2014 0720   AST 21 11/13/2014 0720   ALT 12 (L) 11/13/2014 0720   ALKPHOS 54 11/13/2014 0720   BILITOT 0.1 (L)  11/13/2014 0720   GFRNONAA >60 02/13/2015 1815   GFRAA >60 02/13/2015 1815    No results found for: SPEP, UPEP  Lab Results  Component Value Date   WBC 5.6 04/14/2018   NEUTROABS 3.1 04/14/2018   HGB 12.4 04/14/2018   HCT 37.9 04/14/2018   MCV 88.8 04/14/2018   PLT 318 04/14/2018      Chemistry      Component Value Date/Time   NA 139 02/13/2015 1815   K 3.7 02/13/2015 1815   CL 104 02/13/2015 1815   CO2 28 02/13/2015 1815   BUN 12 02/13/2015 1815   CREATININE 0.92 02/13/2015 1815      Component Value Date/Time   CALCIUM 9.2 02/13/2015 1815   ALKPHOS 54 11/13/2014 0720   AST 21 11/13/2014 0720   ALT 12 (L) 11/13/2014 0720   BILITOT 0.1 (L) 11/13/2014 0720      I spent 10 minutes counseling the patient face to face. The total time spent in the appointment was 15 minutes and more than 50% was on counseling.   All  questions were answered. The patient knows to call the clinic with any problems, questions or concerns. No barriers to learning was detected.    Heath Lark, MD 4/10/202011:00 AM

## 2018-09-18 ENCOUNTER — Telehealth: Payer: Self-pay | Admitting: Hematology and Oncology

## 2018-09-18 NOTE — Telephone Encounter (Signed)
Tried to reach regarding 4/17 °

## 2018-09-22 ENCOUNTER — Ambulatory Visit: Payer: Medicaid Other | Admitting: Allergy

## 2018-09-22 ENCOUNTER — Inpatient Hospital Stay: Payer: Medicaid Other

## 2018-09-22 ENCOUNTER — Other Ambulatory Visit: Payer: Self-pay

## 2018-09-22 VITALS — BP 139/86 | HR 63 | Temp 98.6°F | Resp 18

## 2018-09-22 DIAGNOSIS — D509 Iron deficiency anemia, unspecified: Secondary | ICD-10-CM

## 2018-09-22 DIAGNOSIS — D500 Iron deficiency anemia secondary to blood loss (chronic): Secondary | ICD-10-CM | POA: Diagnosis not present

## 2018-09-22 MED ORDER — SODIUM CHLORIDE 0.9 % IV SOLN
510.0000 mg | Freq: Once | INTRAVENOUS | Status: AC
  Administered 2018-09-22: 510 mg via INTRAVENOUS
  Filled 2018-09-22: qty 17

## 2018-09-22 MED ORDER — SODIUM CHLORIDE 0.9 % IV SOLN
Freq: Once | INTRAVENOUS | Status: AC
  Administered 2018-09-22: 15:00:00 via INTRAVENOUS
  Filled 2018-09-22: qty 250

## 2018-09-22 NOTE — Patient Instructions (Signed)
Coronavirus (COVID-19) Are you at risk?  Are you at risk for the Coronavirus (COVID-19)?  To be considered HIGH RISK for Coronavirus (COVID-19), you have to meet the following criteria:  . Traveled to [place], [place], [place], [place] or [place]; or in the [place] to [place], [place], [place], or [place]; and have fever, cough, and shortness of breath within the last 2 weeks of travel OR . Been in close contact with a person diagnosed with COVID-19 within the last 2 weeks and have fever, cough, and shortness of breath . IF YOU DO NOT MEET THESE CRITERIA, YOU ARE CONSIDERED LOW RISK FOR COVID-19.  What to do if you are HIGH RISK for COVID-19?  . If you are having a medical emergency, call 911. . Seek medical care right away. Before you go to a doctor's office, urgent care or emergency department, call ahead and tell them about your recent travel, contact with someone diagnosed with COVID-19, and your symptoms. You should receive instructions from your physician's office regarding next steps of care.  . When you arrive at healthcare provider, tell the healthcare staff immediately you have returned from visiting [place], [place], [place], [place] or [place]; or traveled in the [place] to [place], [place], [place], or [place]; in the last two weeks or you have been in close contact with a person diagnosed with COVID-19 in the last 2 weeks.   . Tell the health care staff about your symptoms: fever, cough and shortness of breath. . After you have been seen by a medical provider, you will be either: o Tested for (COVID-19) and discharged home on quarantine except to seek medical care if symptoms worsen, and asked to  - Stay home and avoid contact with others until you get your results (4-5 days)  - Avoid travel on public transportation if possible (such as bus, train, or airplane) or o Sent to the Emergency Department by EMS for evaluation, COVID-19 testing, and possible  admission depending on your condition and test results.  What to do if you are LOW RISK for COVID-19?  Reduce your risk of any infection by using the same precautions used for avoiding the common cold or flu:  . Wash your hands often with soap and warm water for at least 20 seconds.  If soap and water are not readily available, use an alcohol-based hand sanitizer with at least 60% alcohol.  . If coughing or sneezing, cover your mouth and nose by coughing or sneezing into the elbow areas of your shirt or coat, into a tissue or into your sleeve (not your hands). . Avoid shaking hands with others and consider head nods or verbal greetings only. . Avoid touching your eyes, nose, or mouth with unwashed hands.  . Avoid close contact with people who are sick. . Avoid places or events with large numbers of people in one location, like concerts or sporting events. . Carefully consider travel plans you have or are making. . If you are planning any travel outside or inside the [place], visit the CDC's Travelers' Health webpage for the latest health notices. . If you have some symptoms but not all symptoms, continue to monitor at home and seek medical attention if your symptoms worsen. . If you are having a medical emergency, call 911.   ADDITIONAL HEALTHCARE OPTIONS FOR PATIENTS  [facility] / e-Visit: [email or web address]         [facility]: [phone]  [facility]: [phone]                   [facility]: [phone]   

## 2018-09-29 ENCOUNTER — Other Ambulatory Visit: Payer: Self-pay

## 2018-09-29 ENCOUNTER — Encounter: Payer: Self-pay | Admitting: Allergy

## 2018-09-29 ENCOUNTER — Ambulatory Visit (INDEPENDENT_AMBULATORY_CARE_PROVIDER_SITE_OTHER): Payer: Medicaid Other | Admitting: Allergy

## 2018-09-29 VITALS — Wt 242.0 lb

## 2018-09-29 DIAGNOSIS — L509 Urticaria, unspecified: Secondary | ICD-10-CM

## 2018-09-29 DIAGNOSIS — J4530 Mild persistent asthma, uncomplicated: Secondary | ICD-10-CM | POA: Diagnosis not present

## 2018-09-29 DIAGNOSIS — Z720 Tobacco use: Secondary | ICD-10-CM | POA: Diagnosis not present

## 2018-09-29 DIAGNOSIS — J3089 Other allergic rhinitis: Secondary | ICD-10-CM | POA: Diagnosis not present

## 2018-09-29 DIAGNOSIS — J302 Other seasonal allergic rhinitis: Secondary | ICD-10-CM

## 2018-09-29 MED ORDER — FLUTICASONE PROPIONATE HFA 110 MCG/ACT IN AERO: 2.0000 | INHALATION_SPRAY | Freq: Two times a day (BID) | RESPIRATORY_TRACT | 5 refills | Status: AC

## 2018-09-29 NOTE — Assessment & Plan Note (Signed)
Past history - 2020 skin testing was positive to grass and mold.  Continue environmental control measures.   Continue antihistamines.

## 2018-09-29 NOTE — Assessment & Plan Note (Signed)
Having another flare which started a few days ago.  Marland Kitchen Avoid the following potential triggers: alcohol, tight clothing, NSAIDs.   Increase ceterizine to 20mg  twice a day. If this does not help then let me know.

## 2018-09-29 NOTE — Assessment & Plan Note (Addendum)
   Discussed smoking cessation. 

## 2018-09-29 NOTE — Progress Notes (Signed)
RE: Nicole Nicholson MRN: [id number] DOB: 1980-11-02 Date of Telemedicine Visit: 09/29/2018  Referring provider: [name],* Primary care provider: [name], MD  Chief Complaint: Pruritus   Telemedicine Follow Up Visit via Telephone: I connected with Nicole Nicholson for a follow up on 09/29/18 by telephone and verified that I am speaking with the correct person using two identifiers.   I discussed the limitations, risks, security and privacy concerns of performing an evaluation and management service by telephone and the availability of in person appointments. I also discussed with the patient that there may be a patient responsible charge related to this service. The patient expressed understanding and agreed to proceed.  Patient is at home. Provider is at the office.  Visit start time: 11:29AM Visit end time: 12:05PM Insurance consent/check in by: Benetta Spar. Medical consent and medical assistant/nurse: Revonda Humphrey.  History of Present Illness: She is a 38 y.o. female, who is being followed for asthma, allergic rhinitis, urticaria. Her previous allergy office visit was on 08/11/2018 with Dr. Maudie Mercury. Today is a regular follow up visit and pruritus.  Mild persistent asthma without complication Currently using Qvar 80 1-2 puffs 1-2 times a day which seems to be helping her breathing however insurance does not cover Qvar. Denies any SOB, coughing, wheezing, chest tightness, nocturnal awakenings, ER/urgent care visits or prednisone use since the last visit. No recent albuterol use.   Went to see PCP and iron was low. Receiving treatment for this but her breathing improved before receiving iron.   Other allergic rhinitis Started on fluticasone nasal spray for nasal polyps.  Still taking zyrtec for the hives.   Urticaria Having daily welts/hives after scratching and it flared up the last 3-4 days.  Currently on zyrtec 10mg  daily and hydroxyzine as needed.    The Pepcid seems to cause some loose stools so only taking it sometimes.  Tobacco use Trying to quit, smoking 1/2 pack per day.   Assessment and Plan: Nicole Nicholson is a 38 y.o. female with: Mild persistent asthma without complication Doing much better with Qvar 80 1-2 puffs 1-2 times a day but insurance won't cover.   Daily controller medication(s):switch to Flovent 110 2 puffs twice a day and rinse mouth afterwards. This is covered by your insurance and is replacing Qvar.  ? Continue montelukast 10mg  daily.  Prior to physical activity:May use albuterol rescue inhaler 2 puffs 5 to 15 minutes prior to strenuous physical activities.  Rescue medications:May use albuterol rescue inhaler 2 puffs or nebulizer every 4 to 6 hours as needed for shortness of breath, chest tightness, coughing, and wheezing. Monitor frequency of use.   Get spirometry at next visit.  Seasonal and perennial allergic rhinitis Past history - 2020 skin testing was positive to grass and mold.  Continue environmental control measures.   Continue antihistamines.   Urticaria Having another flare which started a few days ago.   Avoid the following potential triggers: alcohol, tight clothing, NSAIDs.   Increase ceterizine to 20mg  twice a day. If this does not help then let me know.   Tobacco use  Discussed smoking cessation.  Return in about 2 months (around 11/29/2018).  Meds ordered this encounter  Medications   fluticasone (FLOVENT HFA) 110 MCG/ACT inhaler    Sig: Inhale 2 puffs into the lungs 2 (two) times daily.    Dispense:  1 Inhaler    Refill:  5   Diagnostics: None.  Medication List:  Current Outpatient Medications  Medication Sig Dispense Refill  albuterol (PROVENTIL HFA;VENTOLIN HFA) 108 (90 Base) MCG/ACT inhaler INL 2 PFS PO QID PRN     ALPRAZolam (XANAX) 0.5 MG tablet TK 1 T PO TWO TIMES DAILY AS NEED     cetirizine (ZYRTEC) 10 MG tablet Take 1 tablet (10 mg total) by mouth every  morning. (Patient taking differently: Take 10 mg by mouth daily as needed. ) 34 tablet 5   clindamycin (CLINDAGEL) 1 % gel APP EXT AA BID     clotrimazole-betamethasone (LOTRISONE) cream APP EXT AA BID     cyclobenzaprine (FLEXERIL) 10 MG tablet as needed.      diclofenac (VOLTAREN) 50 MG EC tablet TK 1 T PO TID PRN  1   EPINEPHrine (EPIPEN 2-PAK) 0.3 mg/0.3 mL IJ SOAJ injection Use as directed for severe allergic reactions 2 Device 2   etonogestrel-ethinyl estradiol (NUVARING) 0.12-0.015 MG/24HR vaginal ring Insert into vagina for 21 days, then remove for  Seven days and replace with new ring     famotidine (PEPCID) 20 MG tablet Take 20 mg by mouth 2 (two) times daily.     fluticasone (FLONASE) 50 MCG/ACT nasal spray SHAKE LQ AND U 2 SPRAYS IEN D     fluticasone (FLOVENT HFA) 110 MCG/ACT inhaler Inhale 2 puffs into the lungs 2 (two) times daily. 1 Inhaler 5   folic acid (FOLVITE) 275 MCG tablet Take 400 mcg by mouth daily.     furosemide (LASIX) 40 MG tablet TK 1 T PO  every other day  1   hydrOXYzine (ATARAX/VISTARIL) 25 MG tablet Take 1 tablet (25 mg total) by mouth at bedtime. 34 tablet 5   loratadine (CLARITIN) 10 MG tablet TK 1 T PO D PRN     montelukast (SINGULAIR) 10 MG tablet Take 1 tablet once a day to prevent cough or wheeze 34 tablet 5   NATURE-THROID 97.5 MG TABS TK 1 AND 1/2 TS PO QD  11   ondansetron (ZOFRAN-ODT) 8 MG disintegrating tablet DIS 1 T ON THE TONGUE Q 8 H PRN  2   promethazine (PHENERGAN) 50 MG tablet TK 1 T PO BID     traMADol (ULTRAM) 50 MG tablet TAKE ONE TO TWO TABLETS BY MOUTH FOUR TIMES DAILY AS NEEDED     Vitamin D, Ergocalciferol, (DRISDOL) 50000 units CAPS capsule Take 50,000 Units by mouth every 7 (seven) days.      Current Facility-Administered Medications  Medication Dose Route Frequency Provider Last Rate Last Dose   0.9 %  sodium chloride infusion  500 mL Intravenous Continuous Irene Shipper, MD       Allergies: Allergies    Allergen Reactions   Codeine Anaphylaxis    Allergic to TYLENOL # 3   Darvocet [Propoxyphene N-Acetaminophen] Anaphylaxis   Percocet [Oxycodone-Acetaminophen] Anaphylaxis   Tylenol [Acetaminophen] Anaphylaxis    Allergic to TYLENOL # 3   Mirabegron Itching   I reviewed her past medical history, social history, family history, and environmental history and no significant changes have been reported from previous visit on 08/11/2018.  Review of Systems  Constitutional: Negative for appetite change, chills, fever and unexpected weight change.  HENT: Negative for congestion and rhinorrhea.   Eyes: Negative for itching.  Respiratory: Negative for cough, chest tightness, shortness of breath and wheezing.   Gastrointestinal: Negative for abdominal pain.  Skin: Positive for rash.  Allergic/Immunologic: Positive for environmental allergies.  Neurological: Negative for headaches.   Objective: Physical Exam Not obtained as encounter was done via telephone.   Previous notes  and tests were reviewed.  I discussed the assessment and treatment plan with the patient. The patient was provided an opportunity to ask questions and all were answered. The patient agreed with the plan and demonstrated an understanding of the instructions. After visit summary/patient instructions available via e-mail.   The patient was advised to call back or seek an in-person evaluation if the symptoms worsen or if the condition fails to improve as anticipated.  I provided 36 minutes of non-face-to-face time during this encounter.  It was my pleasure to participate in Nicole Nicholson care today. Please feel free to contact me with any questions or concerns.   Sincerely,  Rexene Alberts, DO Allergy & Immunology  [facility] office: [phone] [facility] office: [phone]

## 2018-09-29 NOTE — Patient Instructions (Addendum)
Mild persistent asthma without complication  Daily controller medication(s):switch to Flovent 110 2 puffs twice a day and rinse mouth afterwards. This is covered by your insurance and is replacing Qvar.  ? Continue montelukast 10mg  daily.  Prior to physical activity:May use albuterol rescue inhaler 2 puffs 5 to 15 minutes prior to strenuous physical activities.  Rescue medications:May use albuterol rescue inhaler 2 puffs or nebulizer every 4 to 6 hours as needed for shortness of breath, chest tightness, coughing, and wheezing. Monitor frequency of use.  . Asthma control goals:  o Full participation in all desired activities (may need albuterol before activity) o Albuterol use two times or less a week on average (not counting use with activity) o Cough interfering with sleep two times or less a month o Oral steroids no more than once a year o No hospitalizations  Other allergic rhinitis - grass, mold  Continue environmental control measures.   Urticaria  Avoid the following potential triggers: alcohol, tight clothing, NSAIDs.   Increase ceterizine to 20mg  twice a day. If this does not help then let me know.   Tobacco use  Try to stop smoking.  Follow up in 2 months  Sincerely,  Rexene Alberts, DO Allergy & Immunology  [facility] [place] [facility] office: [phone] [facility] office: [phone]  Reducing Pollen Exposure . Pollen seasons: trees (spring), grass (summer) and ragweed/weeds (fall). Marland Kitchen Keep windows closed in your home and car to lower pollen exposure.  Susa Simmonds air conditioning in the bedroom and throughout the house if possible.  . Avoid going out in dry windy days - especially early morning. . Pollen counts are highest between 5 - 10 AM and on dry, hot and windy days.  . Save outside activities for late afternoon or after a heavy rain, when pollen levels are lower.  . Avoid mowing of grass if you have grass pollen allergy. Marland Kitchen Be  aware that pollen can also be transported indoors on people and pets.  . Dry your clothes in an automatic dryer rather than hanging them outside where they might collect pollen.  . Rinse hair and eyes before bedtime. Mold Control . Mold and fungi can grow on a variety of surfaces provided certain temperature and moisture conditions exist.  . Outdoor molds grow on plants, decaying vegetation and soil. The major outdoor mold, Alternaria and Cladosporium, are found in very high numbers during hot and dry conditions. Generally, a late summer - fall peak is seen for common outdoor fungal spores. Rain will temporarily lower outdoor mold spore count, but counts rise rapidly when the rainy period ends. . The most important indoor molds are Aspergillus and Penicillium. Dark, humid and poorly ventilated basements are ideal sites for mold growth. The next most common sites of mold growth are the bathroom and the kitchen. Outdoor (Seasonal) Mold Control . Use air conditioning and keep windows closed. . Avoid exposure to decaying vegetation. Marland Kitchen Avoid leaf raking. . Avoid grain handling. . Consider wearing a face mask if working in moldy areas.  Indoor (Perennial) Mold Control  . Maintain humidity below 50%. . Get rid of mold growth on hard surfaces with water, detergent and, if necessary, 5% bleach (do not mix with other cleaners). Then dry the area completely. If mold covers an area more than 10 square feet, consider hiring an indoor environmental professional. . For clothing, washing with soap and water is best. If moldy items cannot be cleaned and dried, throw them away. Marland Kitchen  Remove sources e.g. contaminated carpets. . Repair and seal leaking roofs or pipes. Using dehumidifiers in damp basements may be helpful, but empty the water and clean units regularly to prevent mildew from forming. All rooms, especially basements, bathrooms and kitchens, require ventilation and cleaning to deter mold and mildew growth.  Avoid carpeting on concrete or damp floors, and storing items in damp areas.

## 2018-09-29 NOTE — Assessment & Plan Note (Signed)
Doing much better with Qvar 80 1-2 puffs 1-2 times a day but insurance won't cover.   Daily controller medication(s):switch to Flovent 110 2 puffs twice a day and rinse mouth afterwards. This is covered by your insurance and is replacing Qvar.  ? Continue montelukast 10mg  daily.  Prior to physical activity:May use albuterol rescue inhaler 2 puffs 5 to 15 minutes prior to strenuous physical activities.  Rescue medications:May use albuterol rescue inhaler 2 puffs or nebulizer every 4 to 6 hours as needed for shortness of breath, chest tightness, coughing, and wheezing. Monitor frequency of use.   Get spirometry at next visit.

## 2018-11-06 ENCOUNTER — Encounter: Payer: Self-pay | Admitting: Hematology and Oncology

## 2019-01-05 ENCOUNTER — Ambulatory Visit: Payer: Medicaid Other | Admitting: Allergy

## 2019-01-18 NOTE — Progress Notes (Deleted)
RE: Nicole Nicholson MRN: [id number] DOB: [birth year] Date of Telemedicine Visit: 01/19/2019  Referring provider: [name],* Primary care provider: [name], MD  Chief Complaint: No chief complaint on file.   Telemedicine Follow Up Visit via Telephone: I connected with Nicole Nicholson for a follow up on 01/18/19 by telephone and verified that I am speaking with the correct person using two identifiers.   I discussed the limitations, risks, security and privacy concerns of performing an evaluation and management service by telephone and the availability of in person appointments. I also discussed with the patient that there may be a patient responsible charge related to this service. The patient expressed understanding and agreed to proceed.  Patient is at *** accompanied by *** who provided/contributed to the history.  Provider is at the office.  Visit start time: *** Visit end time: *** Insurance consent/check in by: *** Medical consent and medical assistant/nurse: ***  History of Present Illness: She is a 38 y.o. female, who is being followed for asthma, allergic rhinitis, urticaria. Her previous allergy office visit was on 09/29/2018 via telemedicine with Dr. Maudie Mercury. Today is a regular follow up visit.  Mild persistent asthma without complication Doing much better with Qvar 80 1-2 puffs 1-2 times a day but insurance won't cover.   Daily controller medication(s):switch to Flovent 110 2 puffs twice a day and rinse mouth afterwards. This is covered by your insurance and is replacing Qvar.  ? Continue montelukast 10mg  daily.  Prior to physical activity:May use albuterol rescue inhaler 2 puffs 5 to 15 minutes prior to strenuous physical activities.  Rescue medications:May use albuterol rescue inhaler 2 puffs or nebulizer every 4 to 6 hours as needed for shortness of breath, chest tightness, coughing, and wheezing. Monitor frequency of use.  Get spirometry at  next visit.  Seasonal and perennial allergic rhinitis Past history - 2020 skin testing was positive to grass and mold.  Continue environmental control measures.   Continue antihistamines.   Urticaria Having another flare which started a few days ago.   Avoid the following potential triggers: alcohol, tight clothing, NSAIDs.   Increase ceterizine to 20mg  twice a day. If this does not help then let me know.   Tobacco use  Discussed smoking cessation.  Return in about 2 months (around 11/29/2018).  Assessment and Plan: Nicole Nicholson is a 38 y.o. female with: No problem-specific Assessment & Plan notes found for this encounter.  No follow-ups on file.  No orders of the defined types were placed in this encounter.  Lab Orders  No laboratory test(s) ordered today    Diagnostics: None.  Medication List:  Current Outpatient Medications  Medication Sig Dispense Refill  . albuterol (PROVENTIL HFA;VENTOLIN HFA) 108 (90 Base) MCG/ACT inhaler INL 2 PFS PO QID PRN    . ALPRAZolam (XANAX) 0.5 MG tablet TK 1 T PO TWO TIMES DAILY AS NEED    . cetirizine (ZYRTEC) 10 MG tablet Take 1 tablet (10 mg total) by mouth every morning. (Patient taking differently: Take 10 mg by mouth daily as needed. ) 34 tablet 5  . clindamycin (CLINDAGEL) 1 % gel APP EXT AA BID    . clotrimazole-betamethasone (LOTRISONE) cream APP EXT AA BID    . cyclobenzaprine (FLEXERIL) 10 MG tablet as needed.     . diclofenac (VOLTAREN) 50 MG EC tablet TK 1 T PO TID PRN  1  . EPINEPHrine (EPIPEN 2-PAK) 0.3 mg/0.3 mL IJ SOAJ injection Use as directed for severe allergic reactions 2 Device  2  . etonogestrel-ethinyl estradiol (NUVARING) 0.12-0.015 MG/24HR vaginal ring Insert into vagina for 21 days, then remove for  Seven days and replace with new ring    . famotidine (PEPCID) 20 MG tablet Take 20 mg by mouth 2 (two) times daily.    . fluticasone (FLONASE) 50 MCG/ACT nasal spray SHAKE LQ AND U 2 SPRAYS IEN D    . fluticasone  (FLOVENT HFA) 110 MCG/ACT inhaler Inhale 2 puffs into the lungs 2 (two) times daily. 1 Inhaler 5  . folic acid (FOLVITE) 716 MCG tablet Take 400 mcg by mouth daily.    . furosemide (LASIX) 40 MG tablet TK 1 T PO  every other day  1  . hydrOXYzine (ATARAX/VISTARIL) 25 MG tablet Take 1 tablet (25 mg total) by mouth at bedtime. 34 tablet 5  . loratadine (CLARITIN) 10 MG tablet TK 1 T PO D PRN    . montelukast (SINGULAIR) 10 MG tablet Take 1 tablet once a day to prevent cough or wheeze 34 tablet 5  . NATURE-THROID 97.5 MG TABS TK 1 AND 1/2 TS PO QD  11  . ondansetron (ZOFRAN-ODT) 8 MG disintegrating tablet DIS 1 T ON THE TONGUE Q 8 H PRN  2  . promethazine (PHENERGAN) 50 MG tablet TK 1 T PO BID    . traMADol (ULTRAM) 50 MG tablet TAKE ONE TO TWO TABLETS BY MOUTH FOUR TIMES DAILY AS NEEDED    . Vitamin D, Ergocalciferol, (DRISDOL) 50000 units CAPS capsule Take 50,000 Units by mouth every 7 (seven) days.      Current Facility-Administered Medications  Medication Dose Route Frequency Provider Last Rate Last Dose  . 0.9 %  sodium chloride infusion  500 mL Intravenous Continuous Irene Shipper, MD       Allergies: Allergies  Allergen Reactions  . Codeine Anaphylaxis    Allergic to TYLENOL # 3  . Darvocet [Propoxyphene N-Acetaminophen] Anaphylaxis  . Percocet [Oxycodone-Acetaminophen] Anaphylaxis  . Tylenol [Acetaminophen] Anaphylaxis    Allergic to TYLENOL # 3  . Mirabegron Itching   I reviewed her past medical history, social history, family history, and environmental history and no significant changes have been reported from previous visit on 09/29/2018.  Review of Systems  Constitutional: Negative for appetite change, chills, fever and unexpected weight change.  HENT: Negative for congestion and rhinorrhea.   Eyes: Negative for itching.  Respiratory: Negative for cough, chest tightness, shortness of breath and wheezing.   Gastrointestinal: Negative for abdominal pain.  Skin: Positive for  rash.  Allergic/Immunologic: Positive for environmental allergies.  Neurological: Negative for headaches.   Objective: Physical Exam Not obtained as encounter was done via telephone.   Previous notes and tests were reviewed.  I discussed the assessment and treatment plan with the patient. The patient was provided an opportunity to ask questions and all were answered. The patient agreed with the plan and demonstrated an understanding of the instructions. After visit summary/patient instructions available via {Blank single:19197::"e-mail","mychart"}.   The patient was advised to call back or seek an in-person evaluation if the symptoms worsen or if the condition fails to improve as anticipated.  I provided *** minutes of non-face-to-face time during this encounter.  It was my pleasure to participate in [place] care today. Please feel free to contact me with any questions or concerns.   Sincerely,  Rexene Alberts, DO Allergy & Immunology  [facility] office: [phone] [facility] office: [place] office: [phone]

## 2019-01-19 ENCOUNTER — Ambulatory Visit: Payer: Medicaid Other | Admitting: Allergy

## 2019-07-19 ENCOUNTER — Ambulatory Visit: Payer: Self-pay

## 2019-07-19 ENCOUNTER — Other Ambulatory Visit: Payer: Self-pay

## 2019-07-23 ENCOUNTER — Telehealth: Payer: Self-pay

## 2019-07-23 ENCOUNTER — Other Ambulatory Visit: Payer: Self-pay | Admitting: Pediatrics

## 2019-07-23 MED ORDER — MONTELUKAST SODIUM 10 MG PO TABS: ORAL_TABLET | ORAL | 0 refills | Status: DC

## 2019-07-23 NOTE — Telephone Encounter (Signed)
Courtesy refill only. No more refills until patient is seen. Calling to let patient know a courtesy refill was sent in, but unable to leave a message on patient's Cell phone because mailbox is too full.

## 2019-07-31 NOTE — Telephone Encounter (Signed)
Appointment scheduled 08/27/2019 at 3:00 pm with Dr. Shaune Leeks

## 2019-08-27 ENCOUNTER — Ambulatory Visit: Payer: Self-pay | Admitting: Pediatrics

## 2020-04-28 ENCOUNTER — Telehealth: Payer: Self-pay

## 2020-04-28 ENCOUNTER — Other Ambulatory Visit: Payer: Self-pay | Admitting: Hematology and Oncology

## 2020-04-28 DIAGNOSIS — D509 Iron deficiency anemia, unspecified: Secondary | ICD-10-CM

## 2020-04-28 DIAGNOSIS — R300 Dysuria: Secondary | ICD-10-CM

## 2020-04-28 NOTE — Telephone Encounter (Signed)
-----   Message from Heath Lark, MD sent at 04/28/2020  2:03 PM EST ----- Regarding: appt I have put in orders Scheduler will call her Due to busy holidays, her appt will be in about 2 weeks, labs, see me and Iv iron same day

## 2020-04-28 NOTE — Telephone Encounter (Signed)
Let me know when you receive it

## 2020-04-28 NOTE — Telephone Encounter (Signed)
Called and given below message. She verbalized understanding. 

## 2020-04-28 NOTE — Telephone Encounter (Signed)
She called and left a message to call her. She got a call from PCP that her iron was low and she is concerned.  Called PCP office, Dr. Isac Sarna and requested recent labs faxed to Dr. Alvy Bimler. Given fax #.

## 2020-04-30 ENCOUNTER — Telehealth: Payer: Self-pay | Admitting: Hematology and Oncology

## 2020-04-30 NOTE — Telephone Encounter (Signed)
Scheduled appt per 11/22 sch msg  - left message for patient with appt date and time  ° °

## 2020-05-13 ENCOUNTER — Inpatient Hospital Stay (HOSPITAL_BASED_OUTPATIENT_CLINIC_OR_DEPARTMENT_OTHER): Payer: Medicaid Other | Admitting: Hematology and Oncology

## 2020-05-13 ENCOUNTER — Other Ambulatory Visit: Payer: Self-pay

## 2020-05-13 ENCOUNTER — Inpatient Hospital Stay: Payer: Medicaid Other | Attending: Hematology and Oncology

## 2020-05-13 ENCOUNTER — Inpatient Hospital Stay: Payer: Medicaid Other

## 2020-05-13 VITALS — BP 133/73 | HR 76 | Temp 98.5°F | Resp 18 | Ht 67.0 in | Wt 244.2 lb

## 2020-05-13 VITALS — BP 129/77 | HR 70 | Temp 98.2°F | Resp 20

## 2020-05-13 DIAGNOSIS — D509 Iron deficiency anemia, unspecified: Secondary | ICD-10-CM | POA: Diagnosis not present

## 2020-05-13 LAB — CBC WITH DIFFERENTIAL/PLATELET
Abs Immature Granulocytes: 0.01 10*3/uL (ref 0.00–0.07)
Basophils Absolute: 0 10*3/uL (ref 0.0–0.1)
Basophils Relative: 1 %
Eosinophils Absolute: 0.1 10*3/uL (ref 0.0–0.5)
Eosinophils Relative: 2 %
HCT: 25.4 % — ABNORMAL LOW (ref 36.0–46.0)
Hemoglobin: 7.6 g/dL — ABNORMAL LOW (ref 12.0–15.0)
Immature Granulocytes: 0 %
Lymphocytes Relative: 40 %
Lymphs Abs: 2 10*3/uL (ref 0.7–4.0)
MCH: 22.5 pg — ABNORMAL LOW (ref 26.0–34.0)
MCHC: 29.9 g/dL — ABNORMAL LOW (ref 30.0–36.0)
MCV: 75.1 fL — ABNORMAL LOW (ref 80.0–100.0)
Monocytes Absolute: 0.6 10*3/uL (ref 0.1–1.0)
Monocytes Relative: 11 %
Neutro Abs: 2.3 10*3/uL (ref 1.7–7.7)
Neutrophils Relative %: 46 %
Platelets: 329 10*3/uL (ref 150–400)
RBC: 3.38 MIL/uL — ABNORMAL LOW (ref 3.87–5.11)
RDW: 15 % (ref 11.5–15.5)
WBC: 5 10*3/uL (ref 4.0–10.5)
nRBC: 0 % (ref 0.0–0.2)

## 2020-05-13 LAB — FERRITIN: Ferritin: 8 ng/mL — ABNORMAL LOW (ref 11–307)

## 2020-05-13 LAB — IRON AND TIBC
Iron: 10 ug/dL — ABNORMAL LOW (ref 41–142)
Saturation Ratios: 2 % — ABNORMAL LOW (ref 21–57)
TIBC: 413 ug/dL (ref 236–444)
UIBC: 402 ug/dL — ABNORMAL HIGH (ref 120–384)

## 2020-05-13 MED ORDER — SODIUM CHLORIDE 0.9 % IV SOLN
510.0000 mg | Freq: Once | INTRAVENOUS | Status: AC
  Administered 2020-05-13: 510 mg via INTRAVENOUS
  Filled 2020-05-13: qty 17

## 2020-05-13 MED ORDER — SODIUM CHLORIDE 0.9 % IV SOLN
Freq: Once | INTRAVENOUS | Status: AC
  Filled 2020-05-13: qty 250

## 2020-05-13 NOTE — Progress Notes (Signed)
Pt. stable for discharge. Left via ambulation, no respiratory distress noted. 

## 2020-05-14 ENCOUNTER — Encounter: Payer: Self-pay | Admitting: *Deleted

## 2020-05-14 ENCOUNTER — Encounter: Payer: Self-pay | Admitting: Hematology and Oncology

## 2020-05-14 ENCOUNTER — Telehealth: Payer: Self-pay

## 2020-05-14 NOTE — Telephone Encounter (Signed)
Left message for pt to call back  °

## 2020-05-14 NOTE — Telephone Encounter (Signed)
-----   Message from Irene Shipper, MD sent at 05/14/2020  9:13 AM EST ----- Regarding: RE: mutual patient Nicole Nicholson,Arrange routine office appointment with one of our advanced practitioners.  Thanks.JP ----- Message ----- From: Heath Lark, MD Sent: 05/14/2020   8:56 AM EST To: Irene Shipper, MD Subject: mutual patient                                 Hi,  I just saw her yesterday She is getting very frequent IV iron and she claimed rectal bleeding as the most likely source Would you mind see her back and repeat endoscopies?  Thanks, Ni

## 2020-05-14 NOTE — Progress Notes (Signed)
[place] OFFICE PROGRESS NOTE  Patient, No Pcp Per  ASSESSMENT & PLAN:  Iron deficiency anemia She has very profound anemia since her last time I saw her Her blood counts several months ago was completely normal and now she is profoundly anemic with signs of iron deficiency She denies heavy menorrhagia The patient has noticed intermittent rectal bleeding On review of her electronic records, she had endoscopy evaluation in 2018 I recommend referral back to GI service for repeat endoscopy for further evaluation for source of GI bleed and she is in agreement We discussed the risk and benefits of blood transfusion but the patient declined I recommend we proceed with IV iron weekly x2 and I plan to see her again next month for further follow-up   Orders Placed This Encounter  Procedures  . Ambulatory referral to Social Work    Referral Priority:   Routine    Referral Type:   Consultation    Referral Reason:   Specialty Services Required    Number of Visits Requested:   1  . Ambulatory referral to Gastroenterology    Referral Priority:   Routine    Referral Type:   Consultation    Referral Reason:   Specialty Services Required    Referred to Provider:   Irene Shipper, MD    Number of Visits Requested:   1    The total time spent in the appointment was 20 minutes encounter with patients including review of chart and various tests results, discussions about plan of care and coordination of care plan   All questions were answered. The patient knows to call the clinic with any problems, questions or concerns. No barriers to learning was detected.    Heath Lark, MD 12/8/20218:55 AM  INTERVAL HISTORY: Lakevia Noller 39 y.o. female returns for further follow-up on chronic iron deficiency anemia Since last time I saw her, she complained of excessive fatigue She has noticed intermittent rectal bleeding She denies menorrhagia since she was placed on birth control She is  using diclofenac intermittently for joint pain  SUMMARY OF HEMATOLOGIC HISTORY:  [place] is seen here for severe symptomatic anemia and severe fatigue  She was found to have abnormal CBC from recent ER/urgent care visit. She has "anemia all her life". She had multiple pregnancies in the past and was told she was anemic She denies recent chest pain on exertion, but does have shortness of breath on minimal exertion, pre-syncopal episodes, or palpitations. She had not noticed any recent bleeding such as epistaxis, hematuria or hematochezia The patient has occasional over the counter NSAID ingestion. She is not on antiplatelets agents.  She had no prior history or diagnosis of cancer. Her age appropriate screening programs are up-to-date. She complained of pica but eats a variety of diet. She never donated blood or received blood transfusion The patient was prescribed oral iron supplements and she takes 1 daily but tolerated that poorly. She has excessive menorrhagia monthly and was told she has fibroids/ovarian cysts. She had thyroid surgery in the past and complained of chronic fatigue She received multiple doses of IV iron in 2016 & 2017 and March 2018 with improvement of energy level She had EGD and colonoscopy in 2018 which showed no evidence of bleeding lesions She has evaluation and endometrial biopsy in 2019 that came back benign   I have reviewed the past medical history, past surgical history, social history and family history with the patient and they are unchanged from  previous note.  ALLERGIES:  is allergic to codeine, darvocet [propoxyphene n-acetaminophen], percocet [oxycodone-acetaminophen], tylenol [acetaminophen], and mirabegron.  MEDICATIONS:  Current Outpatient Medications  Medication Sig Dispense Refill  . albuterol (PROVENTIL HFA;VENTOLIN HFA) 108 (90 Base) MCG/ACT inhaler INL 2 PFS PO QID PRN    . ALPRAZolam (XANAX) 0.5 MG tablet TK 1 T PO TWO TIMES DAILY AS  NEED    . cetirizine (ZYRTEC) 10 MG tablet Take 1 tablet (10 mg total) by mouth every morning. (Patient taking differently: Take 10 mg by mouth daily as needed. ) 34 tablet 5  . clindamycin (CLINDAGEL) 1 % gel APP EXT AA BID    . clotrimazole-betamethasone (LOTRISONE) cream APP EXT AA BID    . cyclobenzaprine (FLEXERIL) 10 MG tablet as needed.     . diclofenac (VOLTAREN) 50 MG EC tablet TK 1 T PO TID PRN  1  . EPINEPHrine (EPIPEN 2-PAK) 0.3 mg/0.3 mL IJ SOAJ injection Use as directed for severe allergic reactions 2 Device 2  . etonogestrel-ethinyl estradiol (NUVARING) 0.12-0.015 MG/24HR vaginal ring Insert into vagina for 21 days, then remove for  Seven days and replace with new ring    . famotidine (PEPCID) 20 MG tablet Take 20 mg by mouth 2 (two) times daily.    . fluticasone (FLONASE) 50 MCG/ACT nasal spray SHAKE LQ AND U 2 SPRAYS IEN D    . fluticasone (FLOVENT HFA) 110 MCG/ACT inhaler Inhale 2 puffs into the lungs 2 (two) times daily. 1 Inhaler 5  . folic acid (FOLVITE) 756 MCG tablet Take 400 mcg by mouth daily.    . furosemide (LASIX) 40 MG tablet TK 1 T PO  every other day  1  . hydrOXYzine (ATARAX/VISTARIL) 25 MG tablet Take 1 tablet (25 mg total) by mouth at bedtime. 34 tablet 5  . loratadine (CLARITIN) 10 MG tablet TK 1 T PO D PRN    . montelukast (SINGULAIR) 10 MG tablet TAKE 1 TABLET BY MOUTH DAILY TO PREVENT COUGH OR WHEEZING 300 tablet 0  . NATURE-THROID 97.5 MG TABS TK 1 AND 1/2 TS PO QD (Patient not taking: Reported on 05/13/2020)  11  . ondansetron (ZOFRAN-ODT) 8 MG disintegrating tablet DIS 1 T ON THE TONGUE Q 8 H PRN  2  . promethazine (PHENERGAN) 50 MG tablet TK 1 T PO BID    . traMADol (ULTRAM) 50 MG tablet TAKE ONE TO TWO TABLETS BY MOUTH FOUR TIMES DAILY AS NEEDED    . Vitamin D, Ergocalciferol, (DRISDOL) 50000 units CAPS capsule Take 50,000 Units by mouth every 7 (seven) days.      Current Facility-Administered Medications  Medication Dose Route Frequency Provider Last  Rate Last Admin  . 0.9 %  sodium chloride infusion  500 mL Intravenous Continuous Irene Shipper, MD         REVIEW OF SYSTEMS:   Constitutional: Denies fevers, chills or night sweats Eyes: Denies blurriness of vision Ears, nose, mouth, throat, and face: Denies mucositis or sore throat Respiratory: Denies cough, dyspnea or wheezes Cardiovascular: Denies palpitation, chest discomfort or lower extremity swelling Gastrointestinal:  Denies nausea, heartburn or change in bowel habits Skin: Denies abnormal skin rashes Lymphatics: Denies new lymphadenopathy or easy bruising Neurological:Denies numbness, tingling or new weaknesses Behavioral/Psych: Mood is stable, no new changes  All other systems were reviewed with the patient and are negative.  PHYSICAL EXAMINATION: ECOG PERFORMANCE STATUS: 1 - Symptomatic but completely ambulatory  Vitals:   05/13/20 1239  BP: 133/73  Pulse: 76  Resp: 18  Temp: 98.5 F (36.9 C)  SpO2: 100%   Filed Weights   05/13/20 1239  Weight: 244 lb 3.2 oz (110.8 kg)    GENERAL:alert, no distress and comfortable  Musculoskeletal:no cyanosis of digits and no clubbing  NEURO: alert & oriented x 3 with fluent speech, no focal motor/sensory deficits  LABORATORY DATA:  I have reviewed the data as listed     Component Value Date/Time   NA 139 02/13/2015 1815   K 3.7 02/13/2015 1815   CL 104 02/13/2015 1815   CO2 28 02/13/2015 1815   GLUCOSE 84 02/13/2015 1815   BUN 12 02/13/2015 1815   CREATININE 0.92 02/13/2015 1815   CALCIUM 9.2 02/13/2015 1815   PROT 7.6 11/13/2014 0720   ALBUMIN 3.5 11/13/2014 0720   AST 21 11/13/2014 0720   ALT 12 (L) 11/13/2014 0720   ALKPHOS 54 11/13/2014 0720   BILITOT 0.1 (L) 11/13/2014 0720   GFRNONAA >60 02/13/2015 1815   GFRAA >60 02/13/2015 1815    No results found for: SPEP, UPEP  Lab Results  Component Value Date   WBC 5.0 05/13/2020   NEUTROABS 2.3 05/13/2020   HGB 7.6 (L) 05/13/2020   HCT 25.4 (L)  05/13/2020   MCV 75.1 (L) 05/13/2020   PLT 329 05/13/2020      Chemistry      Component Value Date/Time   NA 139 02/13/2015 1815   K 3.7 02/13/2015 1815   CL 104 02/13/2015 1815   CO2 28 02/13/2015 1815   BUN 12 02/13/2015 1815   CREATININE 0.92 02/13/2015 1815      Component Value Date/Time   CALCIUM 9.2 02/13/2015 1815   ALKPHOS 54 11/13/2014 0720   AST 21 11/13/2014 0720   ALT 12 (L) 11/13/2014 0720   BILITOT 0.1 (L) 11/13/2014 0720

## 2020-05-14 NOTE — Telephone Encounter (Signed)
Pt is requesting a call back from a nurse (missed call) 

## 2020-05-14 NOTE — Assessment & Plan Note (Signed)
She has very profound anemia since her last time I saw her Her blood counts several months ago was completely normal and now she is profoundly anemic with signs of iron deficiency She denies heavy menorrhagia The patient has noticed intermittent rectal bleeding On review of her electronic records, she had endoscopy evaluation in 2018 I recommend referral back to GI service for repeat endoscopy for further evaluation for source of GI bleed and she is in agreement We discussed the risk and benefits of blood transfusion but the patient declined I recommend we proceed with IV iron weekly x2 and I plan to see her again next month for further follow-up

## 2020-05-14 NOTE — Telephone Encounter (Signed)
Pt scheduled to see Dr. Henrene Pastor 05/21/20@8 :Buena Irish with pt and she is aware of appt date and time.

## 2020-05-14 NOTE — Progress Notes (Signed)
[place] Psychosocial Distress Screening Clinical Social Work  Clinical Social Work was referred by distress screening protocol.  The patient scored a 6 on the Psychosocial Distress Thermometer which indicates moderate distress. Clinical Social Worker contacted patient by phone to assess for distress and other psychosocial needs.  Patient requested CSW call her at a later time when she can discuss her matters in private. CSW agreed to follow up with patient.  ONCBCN DISTRESS SCREENING 05/13/2020  Screening Type Initial Screening  Distress experienced in past week (1-10) 6  Practical problem type Housing;Insurance;Work/school;Transportation;Food;Childcare  Family Problem type Partner;Children  Emotional problem type Depression;Nervousness/Anxiety;Adjusting to illness;Isolation/feeling alone;Feeling hopeless;Boredom;Adjusting to appearance changes  Physician notified of physical symptoms Yes  Referral to clinical social work Yes    Clinical Social Worker follow up needed: Yes.    If yes, follow up plan: will call patient tomorrow  Gwinda Maine, LCSW

## 2020-05-15 ENCOUNTER — Telehealth: Payer: Self-pay | Admitting: *Deleted

## 2020-05-15 NOTE — Telephone Encounter (Signed)
CSW contacted patient as follow up from distress screening at patients request to call during lunch hour.  CSW left generic voicemail to return call.  Maryjean Morn, MSW, LCSW, OSW-C Clinical Social Worker [facility] [phone]

## 2020-05-20 ENCOUNTER — Inpatient Hospital Stay: Payer: Medicaid Other

## 2020-05-20 ENCOUNTER — Other Ambulatory Visit: Payer: Self-pay

## 2020-05-20 VITALS — BP 128/66 | HR 66 | Temp 98.1°F | Resp 18

## 2020-05-20 DIAGNOSIS — D509 Iron deficiency anemia, unspecified: Secondary | ICD-10-CM

## 2020-05-20 MED ORDER — SODIUM CHLORIDE 0.9 % IV SOLN
510.0000 mg | Freq: Once | INTRAVENOUS | Status: AC
  Administered 2020-05-20: 10:00:00 510 mg via INTRAVENOUS
  Filled 2020-05-20: qty 510

## 2020-05-20 NOTE — Patient Instructions (Signed)

## 2020-05-21 ENCOUNTER — Ambulatory Visit: Payer: Medicaid Other | Admitting: Internal Medicine

## 2020-05-21 ENCOUNTER — Encounter: Payer: Self-pay | Admitting: Internal Medicine

## 2020-05-21 VITALS — BP 122/78 | HR 72 | Ht 67.0 in | Wt 243.0 lb

## 2020-05-21 DIAGNOSIS — K6289 Other specified diseases of anus and rectum: Secondary | ICD-10-CM | POA: Diagnosis not present

## 2020-05-21 DIAGNOSIS — K5901 Slow transit constipation: Secondary | ICD-10-CM

## 2020-05-21 DIAGNOSIS — K625 Hemorrhage of anus and rectum: Secondary | ICD-10-CM

## 2020-05-21 DIAGNOSIS — K602 Anal fissure, unspecified: Secondary | ICD-10-CM

## 2020-05-21 MED ORDER — DILTIAZEM GEL 2 %: 1.0000 "application " | Freq: Two times a day (BID) | CUTANEOUS | 1 refills | Status: DC

## 2020-05-21 NOTE — Patient Instructions (Signed)
We have sent a prescription for Diltiazem gel to [facility]. You should apply a pea size amount to your rectum five times daily x 6-8 weeks.  [facility]'s information is below: Address: [address], [place], [place] [address]  [phone]  *Please DO NOT go directly from our office to pick up this medication! Give the pharmacy 1 day to process the prescription as this is compounded and takes time to make.  Take 2 tablespoons of Metamucil daily  Sitz Baths

## 2020-05-21 NOTE — Progress Notes (Signed)
HISTORY OF PRESENT ILLNESS:  Nicole Nicholson is a 39 y.o. female who is sent today by hematology regarding recurrent iron deficiency anemia and rectal bleeding.  Patient was evaluated in January 2018 regarding iron deficiency anemia, constipation, intermittent solid food dysphagia, and constipation associated abdominal pain.  See that dictation.  She has had iron deficiency anemia for at least years.  Initially attributed to heavy menstrual periods.  After referral to GI in 2018 she underwent colonoscopy and upper endoscopy on November 17, 2016.  The terminal ileum was normal.  Colonoscopy was normal except for internal hemorrhoids and mild melanosis coli.  Diminutive hyperplastic polyp was removed.  Upper endoscopy was normal.  With adequate iron replacement the patient's blood counts normalized.  Last blood counts 2 years ago was 12.4.  Recent blood counts May 13, 2020 revealed hemoglobin of 7.6.  Ferritin of 8.  Iron saturation 2%.  Patient continues to menstruate.  She did mention problems with rectal bleeding for which she is now referred.  Patient tells me that she has chronic constipation.  Approximately 3 to 4 months ago she began to notice rectal bleeding associated with rectal pain while defecating.  For constipation she takes Dulcolax.  She may move her bowels once or twice per week.  Last time she saw blood was 5 days ago.  She did have a bowel movement today, but no pain.  Since her last visit here she has gained about 20 pounds.  She does have occasional bloating.  She recently received iron fusion.  REVIEW OF SYSTEMS:  All non-GI ROS negative unless otherwise stated in the HPI except for anxiety, back pain, fatigue, headaches, itching, menstrual cramps, muscle pain, ankle edema, skin rash, shortness of breath  Past Medical History:  Diagnosis Date  . Allergy   . Anxiety   . Constipation    occasional not chronic per pt.  . Depression   . Hypertension   . Hypothyroidism   . Iron  deficiency anemia 11/13/2014  . 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 Pulmonary hypertension ([place])     Past Surgical History:  Procedure Laterality Date  . BREAST CYST EXCISION Right   . CESAREAN SECTION    . THYROIDECTOMY  2015  . TUBAL LIGATION    . wisdom teeth removal      Social History Terri Lampi  reports that she has been smoking cigarettes. She has been smoking about 0.50 packs per day. She has never used smokeless tobacco. She reports current alcohol use. She reports that she does not use drugs.  family history includes Allergic rhinitis in her daughter; Aneurysm in her maternal aunt; Asthma in her daughter; Bronchitis in her daughter; Diabetes in her paternal grandmother; Eczema in her daughter; Food Allergy in her daughter; Heart disease in her maternal grandmother; Pancreatic cancer in her maternal grandfather; Stroke in her maternal aunt; Thyroid disease in her maternal grandfather; Urticaria in her daughter.  Allergies  Allergen Reactions  . Codeine Anaphylaxis    Allergic to TYLENOL # 3  . Darvocet [Propoxyphene N-Acetaminophen] Anaphylaxis  . Percocet [Oxycodone-Acetaminophen] Anaphylaxis  . Tylenol [Acetaminophen] Anaphylaxis    Allergic to TYLENOL # 3  . Mirabegron Itching       PHYSICAL EXAMINATION: Vital signs: BP 122/78   Pulse 72   Ht 5\' 7"  (1.702 m)   Wt 243 lb (110.2 kg)   BMI 38.06 kg/m   Constitutional: generally well-appearing, no acute distress Psychiatric: alert and oriented x3, cooperative Eyes: extraocular movements intact,  anicteric, conjunctiva pink Mouth: oral pharynx moist, no lesions Neck: supple no lymphadenopathy Cardiovascular: heart regular rate and rhythm, no murmur Lungs: clear to auscultation bilaterally Abdomen: soft, nontender, nondistended, no obvious ascites, no peritoneal signs, normal bowel sounds, no organomegaly Rectal: No external abnormalities.  No internal mass.  Brown stool.  Tender posterior anal  fissure. Extremities: no clubbing, cyanosis, or lower extremity edema bilaterally Skin: no lesions on visible extremities Neuro: No focal deficits.  Cranial nerves intact  ASSESSMENT:  1.  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 2.  Chronic constipation. 3.  Chronic iron deficiency anemia of at least 5 years duration.  Recurrent 4.  Still menstruating 5.  Normal colonoscopy with ileal intubation and normal upper endoscopy June 2018 to evaluate the same problem. 6.  Obesity   PLAN:  1.  Discussion 2.  Literature provided for her review on anal fissure 3.  Metamucil or Citrucel 2 tablespoons daily to improve bowel consistency 4.  May use MiraLAX if fiber ineffective 5.  Sitz bath 6.  Prescribe diltiazem ointment 2%.  To be applied circumferentially to the anus as instructed by Dr. Henrene Pastor 5 times daily 7.  Discussed the role of surgery if medical interventions are ineffective 8.  No plans for repeat endoscopic evaluations. 9.  Resume care with hematology.  Monitor blood counts and provide iron infusions as needed

## 2020-06-09 ENCOUNTER — Telehealth: Payer: Self-pay | Admitting: Hematology and Oncology

## 2020-06-09 NOTE — Telephone Encounter (Signed)
Rescheduled appointment per 1/3 schedule message. Patient is aware of changes. 

## 2020-06-11 ENCOUNTER — Other Ambulatory Visit: Payer: Medicaid Other

## 2020-06-11 ENCOUNTER — Ambulatory Visit: Payer: Medicaid Other | Admitting: Hematology and Oncology

## 2020-06-11 ENCOUNTER — Ambulatory Visit: Payer: Medicaid Other

## 2020-06-12 ENCOUNTER — Inpatient Hospital Stay: Payer: Medicaid Other

## 2020-06-12 ENCOUNTER — Other Ambulatory Visit: Payer: Self-pay | Admitting: Hematology and Oncology

## 2020-06-12 ENCOUNTER — Inpatient Hospital Stay: Payer: Medicaid Other | Attending: Hematology and Oncology | Admitting: Hematology and Oncology

## 2020-06-12 ENCOUNTER — Encounter: Payer: Self-pay | Admitting: Hematology and Oncology

## 2020-06-12 ENCOUNTER — Telehealth: Payer: Self-pay | Admitting: Hematology and Oncology

## 2020-06-12 ENCOUNTER — Other Ambulatory Visit: Payer: Self-pay

## 2020-06-12 VITALS — BP 149/74 | HR 82 | Temp 98.1°F | Resp 18

## 2020-06-12 DIAGNOSIS — D509 Iron deficiency anemia, unspecified: Secondary | ICD-10-CM

## 2020-06-12 DIAGNOSIS — K625 Hemorrhage of anus and rectum: Secondary | ICD-10-CM | POA: Diagnosis not present

## 2020-06-12 DIAGNOSIS — K649 Unspecified hemorrhoids: Secondary | ICD-10-CM | POA: Insufficient documentation

## 2020-06-12 DIAGNOSIS — K602 Anal fissure, unspecified: Secondary | ICD-10-CM

## 2020-06-12 DIAGNOSIS — D500 Iron deficiency anemia secondary to blood loss (chronic): Secondary | ICD-10-CM | POA: Insufficient documentation

## 2020-06-12 LAB — CBC WITH DIFFERENTIAL/PLATELET
Abs Immature Granulocytes: 0.01 10*3/uL (ref 0.00–0.07)
Basophils Absolute: 0 10*3/uL (ref 0.0–0.1)
Basophils Relative: 0 %
Eosinophils Absolute: 0.1 10*3/uL (ref 0.0–0.5)
Eosinophils Relative: 2 %
HCT: 35.7 % — ABNORMAL LOW (ref 36.0–46.0)
Hemoglobin: 11 g/dL — ABNORMAL LOW (ref 12.0–15.0)
Immature Granulocytes: 0 %
Lymphocytes Relative: 29 %
Lymphs Abs: 1.6 10*3/uL (ref 0.7–4.0)
MCH: 25.8 pg — ABNORMAL LOW (ref 26.0–34.0)
MCHC: 30.8 g/dL (ref 30.0–36.0)
MCV: 83.6 fL (ref 80.0–100.0)
Monocytes Absolute: 0.5 10*3/uL (ref 0.1–1.0)
Monocytes Relative: 10 %
Neutro Abs: 3.3 10*3/uL (ref 1.7–7.7)
Neutrophils Relative %: 59 %
Platelets: 367 10*3/uL (ref 150–400)
RBC: 4.27 MIL/uL (ref 3.87–5.11)
WBC: 5.6 10*3/uL (ref 4.0–10.5)
nRBC: 0 % (ref 0.0–0.2)

## 2020-06-12 LAB — IRON AND TIBC
Iron: 26 ug/dL — ABNORMAL LOW (ref 41–142)
Saturation Ratios: 8 % — ABNORMAL LOW (ref 21–57)
TIBC: 314 ug/dL (ref 236–444)
UIBC: 287 ug/dL (ref 120–384)

## 2020-06-12 LAB — FERRITIN: Ferritin: 90 ng/mL (ref 11–307)

## 2020-06-12 MED ORDER — SODIUM CHLORIDE 0.9 % IV SOLN
INTRAVENOUS | Status: DC
  Filled 2020-06-12: qty 250

## 2020-06-12 MED ORDER — SODIUM CHLORIDE 0.9 % IV SOLN
510.0000 mg | Freq: Once | INTRAVENOUS | Status: AC
  Administered 2020-06-12: 510 mg via INTRAVENOUS
  Filled 2020-06-12: qty 510

## 2020-06-12 NOTE — Progress Notes (Signed)
VSS stable at discharge. Pt declined observation period.

## 2020-06-12 NOTE — Progress Notes (Signed)
[place] OFFICE PROGRESS NOTE  Patient, No Pcp Per  ASSESSMENT & PLAN:  Iron deficiency anemia Her anemia is much improved but she had recent bleeding Iron studies are pending I recommend we proceed with 2 more doses of IV iron and then recheck in 2 months She is in agreement The most likely cause of her anemia is due to chronic blood loss/malabsorption syndrome. We discussed some of the risks, benefits, and alternatives of intravenous iron infusions. The patient is symptomatic from anemia and the iron level is critically low. She tolerated oral iron supplement poorly and desires to achieved higher levels of iron faster for adequate hematopoesis. Some of the side-effects to be expected including risks of infusion reactions, phlebitis, headaches, nausea and fatigue.  The patient is willing to proceed. Patient education material was dispensed.  Goal is to keep ferritin level greater than 50 and resolution of anemia   Fissure, anal She is prescribed medicine for this We also discussed the importance of adequate hydration and stool softener   No orders of the defined types were placed in this encounter.   The total time spent in the appointment was 20 minutes encounter with patients including review of chart and various tests results, discussions about plan of care and coordination of care plan   All questions were answered. The patient knows to call the clinic with any problems, questions or concerns. No barriers to learning was detected.    Heath Lark, MD 1/6/202211:49 AM  INTERVAL HISTORY: Nicole Nicholson 40 y.o. female returns for further follow-up on chronic, recurrent iron deficiency anemia Since last time I saw her, her energy level has improved She has recent rectal bleeding secondary to hemorrhoids She has seen GI service and was prescribed medicine for this She denies recent excessive menorrhagia or any other sites of bleeding  SUMMARY OF HEMATOLOGIC  HISTORY:  [place] is seen here for severe symptomatic anemia and severe fatigue  She was found to have abnormal CBC from recent ER/urgent care visit. She has "anemia all her life". She had multiple pregnancies in the past and was told she was anemic She denies recent chest pain on exertion, but does have shortness of breath on minimal exertion, pre-syncopal episodes, or palpitations. She had not noticed any recent bleeding such as epistaxis, hematuria or hematochezia The patient has occasional over the counter NSAID ingestion. She is not on antiplatelets agents.  She had no prior history or diagnosis of cancer. Her age appropriate screening programs are up-to-date. She complained of pica but eats a variety of diet. She never donated blood or received blood transfusion The patient was prescribed oral iron supplements and she takes 1 daily but tolerated that poorly. She has excessive menorrhagia monthly and was told she has fibroids/ovarian cysts. She had thyroid surgery in the past and complained of chronic fatigue She received multiple doses of IV iron in 2016 & 2017 and March 2018 with improvement of energy level She had EGD and colonoscopy in 2018 which showed no evidence of bleeding lesions She has evaluation and endometrial biopsy in 2019 that came back benign  I have reviewed the past medical history, past surgical history, social history and family history with the patient and they are unchanged from previous note.  ALLERGIES:  is allergic to codeine, darvocet [propoxyphene n-acetaminophen], percocet [oxycodone-acetaminophen], tylenol [acetaminophen], and mirabegron.  MEDICATIONS:  Current Outpatient Medications  Medication Sig Dispense Refill  . albuterol (PROVENTIL HFA;VENTOLIN HFA) 108 (90 Base) MCG/ACT inhaler INL 2  PFS PO QID PRN    . ALPRAZolam (XANAX) 0.5 MG tablet TK 1 T PO TWO TIMES DAILY AS NEED    . cetirizine (ZYRTEC) 10 MG tablet Take 1 tablet (10 mg total) by  mouth every morning. (Patient taking differently: Take 10 mg by mouth daily as needed.) 34 tablet 5  . clindamycin (CLINDAGEL) 1 % gel APP EXT AA BID    . clotrimazole-betamethasone (LOTRISONE) cream APP EXT AA BID    . cyclobenzaprine (FLEXERIL) 10 MG tablet as needed.     . diclofenac (VOLTAREN) 50 MG EC tablet TK 1 T PO TID PRN  1  . diltiazem 2 % GEL Apply 1 application topically 2 (two) times daily. 30 g 1  . EPINEPHrine (EPIPEN 2-PAK) 0.3 mg/0.3 mL IJ SOAJ injection Use as directed for severe allergic reactions 2 Device 2  . etonogestrel-ethinyl estradiol (NUVARING) 0.12-0.015 MG/24HR vaginal ring Insert into vagina for 21 days, then remove for  Seven days and replace with new ring    . famotidine (PEPCID) 20 MG tablet Take 20 mg by mouth 2 (two) times daily.    . fluticasone (FLONASE) 50 MCG/ACT nasal spray SHAKE LQ AND U 2 SPRAYS IEN D    . fluticasone (FLOVENT HFA) 110 MCG/ACT inhaler Inhale 2 puffs into the lungs 2 (two) times daily. 1 Inhaler 5  . folic acid (FOLVITE) 400 MCG tablet Take 400 mcg by mouth daily.    . furosemide (LASIX) 40 MG tablet TK 1 T PO  every other day  1  . hydrOXYzine (ATARAX/VISTARIL) 25 MG tablet Take 1 tablet (25 mg total) by mouth at bedtime. 34 tablet 5  . loratadine (CLARITIN) 10 MG tablet TK 1 T PO D PRN    . montelukast (SINGULAIR) 10 MG tablet TAKE 1 TABLET BY MOUTH DAILY TO PREVENT COUGH OR WHEEZING 300 tablet 0  . ondansetron (ZOFRAN-ODT) 8 MG disintegrating tablet DIS 1 T ON THE TONGUE Q 8 H PRN  2  . promethazine (PHENERGAN) 50 MG tablet TK 1 T PO BID    . thyroid (ARMOUR) 120 MG tablet Take 1 tablet by mouth daily.    Marland Kitchen thyroid (ARMOUR) 15 MG tablet Take 1 tablet by mouth daily.    . traMADol (ULTRAM) 50 MG tablet TAKE ONE TO TWO TABLETS BY MOUTH FOUR TIMES DAILY AS NEEDED    . Vitamin D, Ergocalciferol, (DRISDOL) 50000 units CAPS capsule Take 50,000 Units by mouth every 7 (seven) days.      No current facility-administered medications for this  visit.     REVIEW OF SYSTEMS:   Constitutional: Denies fevers, chills or night sweats Eyes: Denies blurriness of vision Ears, nose, mouth, throat, and face: Denies mucositis or sore throat Respiratory: Denies cough, dyspnea or wheezes Cardiovascular: Denies palpitation, chest discomfort or lower extremity swelling Gastrointestinal:  Denies nausea, heartburn or change in bowel habits Skin: Denies abnormal skin rashes Lymphatics: Denies new lymphadenopathy or easy bruising Neurological:Denies numbness, tingling or new weaknesses Behavioral/Psych: Mood is stable, no new changes  All other systems were reviewed with the patient and are negative.  PHYSICAL EXAMINATION: ECOG PERFORMANCE STATUS: 1 - Symptomatic but completely ambulatory  Vitals:   06/12/20 1135  BP: (!) 145/75  Pulse: 68  Resp: 18  Temp: 98.3 F (36.8 C)  SpO2: 100%   Filed Weights   06/12/20 1135  Weight: 244 lb 3.2 oz (110.8 kg)    GENERAL:alert, no distress and comfortable Musculoskeletal:no cyanosis of digits and no clubbing  NEURO: alert & oriented x 3 with fluent speech, no focal motor/sensory deficits  LABORATORY DATA:  I have reviewed the data as listed     Component Value Date/Time   NA 139 02/13/2015 1815   K 3.7 02/13/2015 1815   CL 104 02/13/2015 1815   CO2 28 02/13/2015 1815   GLUCOSE 84 02/13/2015 1815   BUN 12 02/13/2015 1815   CREATININE 0.92 02/13/2015 1815   CALCIUM 9.2 02/13/2015 1815   PROT 7.6 11/13/2014 0720   ALBUMIN 3.5 11/13/2014 0720   AST 21 11/13/2014 0720   ALT 12 (L) 11/13/2014 0720   ALKPHOS 54 11/13/2014 0720   BILITOT 0.1 (L) 11/13/2014 0720   GFRNONAA >60 02/13/2015 1815   GFRAA >60 02/13/2015 1815    No results found for: SPEP, UPEP  Lab Results  Component Value Date   WBC 5.6 06/12/2020   NEUTROABS 3.3 06/12/2020   HGB 11.0 (L) 06/12/2020   HCT 35.7 (L) 06/12/2020   MCV 83.6 06/12/2020   PLT 367 06/12/2020      Chemistry      Component Value  Date/Time   NA 139 02/13/2015 1815   K 3.7 02/13/2015 1815   CL 104 02/13/2015 1815   CO2 28 02/13/2015 1815   BUN 12 02/13/2015 1815   CREATININE 0.92 02/13/2015 1815      Component Value Date/Time   CALCIUM 9.2 02/13/2015 1815   ALKPHOS 54 11/13/2014 0720   AST 21 11/13/2014 0720   ALT 12 (L) 11/13/2014 0720   BILITOT 0.1 (L) 11/13/2014 0720

## 2020-06-12 NOTE — Assessment & Plan Note (Signed)
She is prescribed medicine for this We also discussed the importance of adequate hydration and stool softener

## 2020-06-12 NOTE — Patient Instructions (Signed)

## 2020-06-12 NOTE — Assessment & Plan Note (Signed)
Her anemia is much improved but she had recent bleeding Iron studies are pending I recommend we proceed with 2 more doses of IV iron and then recheck in 2 months She is in agreement The most likely cause of her anemia is due to chronic blood loss/malabsorption syndrome. We discussed some of the risks, benefits, and alternatives of intravenous iron infusions. The patient is symptomatic from anemia and the iron level is critically low. She tolerated oral iron supplement poorly and desires to achieved higher levels of iron faster for adequate hematopoesis. Some of the side-effects to be expected including risks of infusion reactions, phlebitis, headaches, nausea and fatigue.  The patient is willing to proceed. Patient education material was dispensed.  Goal is to keep ferritin level greater than 50 and resolution of anemia

## 2020-06-12 NOTE — Telephone Encounter (Signed)
Scheduled appts per 1/6 sch msg. Pt declined printout of AVS.

## 2020-06-18 ENCOUNTER — Inpatient Hospital Stay: Payer: Medicaid Other

## 2020-06-18 ENCOUNTER — Other Ambulatory Visit: Payer: Self-pay

## 2020-06-18 VITALS — BP 151/75 | HR 67 | Temp 98.7°F | Resp 17

## 2020-06-18 DIAGNOSIS — D500 Iron deficiency anemia secondary to blood loss (chronic): Secondary | ICD-10-CM | POA: Diagnosis not present

## 2020-06-18 DIAGNOSIS — D509 Iron deficiency anemia, unspecified: Secondary | ICD-10-CM

## 2020-06-18 MED ORDER — SODIUM CHLORIDE 0.9 % IV SOLN
510.0000 mg | Freq: Once | INTRAVENOUS | Status: AC
  Administered 2020-06-18: 510 mg via INTRAVENOUS
  Filled 2020-06-18: qty 510

## 2020-06-18 MED ORDER — SODIUM CHLORIDE 0.9 % IV SOLN
INTRAVENOUS | Status: DC
  Filled 2020-06-18: qty 250

## 2020-06-18 NOTE — Progress Notes (Signed)
Patient declined to stay for 30 min post observation 

## 2020-08-06 ENCOUNTER — Inpatient Hospital Stay: Payer: Medicaid Other

## 2020-08-06 ENCOUNTER — Other Ambulatory Visit: Payer: Self-pay

## 2020-08-06 ENCOUNTER — Inpatient Hospital Stay (HOSPITAL_BASED_OUTPATIENT_CLINIC_OR_DEPARTMENT_OTHER): Payer: Medicaid Other | Admitting: Hematology and Oncology

## 2020-08-06 ENCOUNTER — Inpatient Hospital Stay: Payer: Medicaid Other | Attending: Hematology and Oncology

## 2020-08-06 DIAGNOSIS — D509 Iron deficiency anemia, unspecified: Secondary | ICD-10-CM | POA: Diagnosis not present

## 2020-08-06 DIAGNOSIS — K909 Intestinal malabsorption, unspecified: Secondary | ICD-10-CM | POA: Insufficient documentation

## 2020-08-06 LAB — CBC WITH DIFFERENTIAL/PLATELET
Abs Immature Granulocytes: 0.01 10*3/uL (ref 0.00–0.07)
Basophils Absolute: 0 10*3/uL (ref 0.0–0.1)
Basophils Relative: 1 %
Eosinophils Absolute: 0.1 10*3/uL (ref 0.0–0.5)
Eosinophils Relative: 2 %
HCT: 37.5 % (ref 36.0–46.0)
Hemoglobin: 12.2 g/dL (ref 12.0–15.0)
Immature Granulocytes: 0 %
Lymphocytes Relative: 32 %
Lymphs Abs: 1.7 10*3/uL (ref 0.7–4.0)
MCH: 29 pg (ref 26.0–34.0)
MCHC: 32.5 g/dL (ref 30.0–36.0)
MCV: 89.1 fL (ref 80.0–100.0)
Monocytes Absolute: 0.6 10*3/uL (ref 0.1–1.0)
Monocytes Relative: 10 %
Neutro Abs: 3 10*3/uL (ref 1.7–7.7)
Neutrophils Relative %: 55 %
Platelets: 339 10*3/uL (ref 150–400)
RBC: 4.21 MIL/uL (ref 3.87–5.11)
RDW: 14.7 % (ref 11.5–15.5)
WBC: 5.4 10*3/uL (ref 4.0–10.5)
nRBC: 0 % (ref 0.0–0.2)

## 2020-08-06 LAB — IRON AND TIBC
Iron: 33 ug/dL — ABNORMAL LOW (ref 41–142)
Saturation Ratios: 12 % — ABNORMAL LOW (ref 21–57)
TIBC: 286 ug/dL (ref 236–444)
UIBC: 253 ug/dL (ref 120–384)

## 2020-08-06 LAB — FERRITIN: Ferritin: 71 ng/mL (ref 11–307)

## 2020-08-06 MED ORDER — SODIUM CHLORIDE 0.9 % IV SOLN
INTRAVENOUS | Status: DC
  Filled 2020-08-06: qty 250

## 2020-08-06 MED ORDER — SODIUM CHLORIDE 0.9 % IV SOLN
510.0000 mg | Freq: Once | INTRAVENOUS | Status: AC
  Administered 2020-08-06: 510 mg via INTRAVENOUS
  Filled 2020-08-06: qty 510

## 2020-08-06 NOTE — Patient Instructions (Signed)
Ferumoxytol injection What is this medicine? FERUMOXYTOL is an iron complex. Iron is used to make healthy red blood cells, which carry oxygen and nutrients throughout the body. This medicine is used to treat iron deficiency anemia. This medicine may be used for other purposes; ask your health care provider or pharmacist if you have questions. COMMON BRAND NAME(S): Feraheme What should I tell my health care provider before I take this medicine? They need to know if you have any of these conditions:  anemia not caused by low iron levels  high levels of iron in the blood  magnetic resonance imaging (MRI) test scheduled  an unusual or allergic reaction to iron, other medicines, foods, dyes, or preservatives  pregnant or trying to get pregnant  breast-feeding How should I use this medicine? This medicine is for injection into a vein. It is given by a health care professional in a hospital or clinic setting. Talk to your pediatrician regarding the use of this medicine in children. Special care may be needed. Overdosage: If you think you have taken too much of this medicine contact a poison control center or emergency room at once. NOTE: This medicine is only for you. Do not share this medicine with others. What if I miss a dose? It is important not to miss your dose. Call your doctor or health care professional if you are unable to keep an appointment. What may interact with this medicine? This medicine may interact with the following medications:  other iron products This list may not describe all possible interactions. Give your health care provider a list of all the medicines, herbs, non-prescription drugs, or dietary supplements you use. Also tell them if you smoke, drink alcohol, or use illegal drugs. Some items may interact with your medicine. What should I watch for while using this medicine? Visit your doctor or healthcare professional regularly. Tell your doctor or healthcare  professional if your symptoms do not start to get better or if they get worse. You may need blood work done while you are taking this medicine. You may need to follow a special diet. Talk to your doctor. Foods that contain iron include: whole grains/cereals, dried fruits, beans, or peas, leafy green vegetables, and organ meats (liver, kidney). What side effects may I notice from receiving this medicine? Side effects that you should report to your doctor or health care professional as soon as possible:  allergic reactions like skin rash, itching or hives, swelling of the face, lips, or tongue  breathing problems  changes in blood pressure  feeling faint or lightheaded, falls  fever or chills  flushing, sweating, or hot feelings  swelling of the ankles or feet Side effects that usually do not require medical attention (report to your doctor or health care professional if they continue or are bothersome):  diarrhea  headache  nausea, vomiting  stomach pain This list may not describe all possible side effects. Call your doctor for medical advice about side effects. You may report side effects to FDA at 1-800-FDA-1088. Where should I keep my medicine? This drug is given in a hospital or clinic and will not be stored at home. NOTE: This sheet is a summary. It may not cover all possible information. If you have questions about this medicine, talk to your doctor, pharmacist, or health care provider.  2021 Elsevier/Gold Standard (2016-07-12 20:21:10)  

## 2020-08-07 ENCOUNTER — Encounter: Payer: Self-pay | Admitting: Hematology and Oncology

## 2020-08-07 NOTE — Assessment & Plan Note (Signed)
Despite resolution of anemia, she is persistently iron deficient Despite recent endometrial ablation, she has recent vaginal bleeding  The most likely cause of her anemia is due to chronic blood loss/malabsorption syndrome. We discussed some of the risks, benefits, and alternatives of intravenous iron infusions. The patient is symptomatic from anemia and the iron level is critically low. She tolerated oral iron supplement poorly and desires to achieved higher levels of iron faster for adequate hematopoesis. Some of the side-effects to be expected including risks of infusion reactions, phlebitis, headaches, nausea and fatigue.  The patient is willing to proceed. Patient education material was dispensed.  Goal is to keep ferritin level greater than 50 and resolution of anemia Recommend we proceed with IV iron today as scheduled and then I plan to see her again in 3 months for further follow-up

## 2020-08-07 NOTE — Progress Notes (Signed)
[facility] OFFICE PROGRESS NOTE  Patient, No Pcp Per  ASSESSMENT & PLAN:  Iron deficiency anemia Despite resolution of anemia, she is persistently iron deficient Despite recent endometrial ablation, she has recent vaginal bleeding  The most likely cause of her anemia is due to chronic blood loss/malabsorption syndrome. We discussed some of the risks, benefits, and alternatives of intravenous iron infusions. The patient is symptomatic from anemia and the iron level is critically low. She tolerated oral iron supplement poorly and desires to achieved higher levels of iron faster for adequate hematopoesis. Some of the side-effects to be expected including risks of infusion reactions, phlebitis, headaches, nausea and fatigue.  The patient is willing to proceed. Patient education material was dispensed.  Goal is to keep ferritin level greater than 50 and resolution of anemia Recommend we proceed with IV iron today as scheduled and then I plan to see her again in 3 months for further follow-up    No orders of the defined types were placed in this encounter.   The total time spent in the appointment was 20 minutes encounter with patients including review of chart and various tests results, discussions about plan of care and coordination of care plan   All questions were answered. The patient knows to call the clinic with any problems, questions or concerns. No barriers to learning was detected.    Heath Lark, MD 3/3/20229:39 AM  INTERVAL HISTORY: Nicole Nicholson 40 y.o. female returns for further follow-up She is having persistent vaginal bleeding despite recent endometrial ablation The patient denies any recent signs or symptoms of bleeding such as spontaneous epistaxis, hematuria or hematochezia. No recent infusion reaction to IV iron She complains of persistent fatigue  SUMMARY OF HEMATOLOGIC HISTORY:  Nicole Nicholson is seen here for severe symptomatic anemia and severe  fatigue  She was found to have abnormal CBC from recent ER/urgent care visit. She has "anemia all her life". She had multiple pregnancies in the past and was told she was anemic She denies recent chest pain on exertion, but does have shortness of breath on minimal exertion, pre-syncopal episodes, or palpitations. She had not noticed any recent bleeding such as epistaxis, hematuria or hematochezia The patient has occasional over the counter NSAID ingestion. She is not on antiplatelets agents.  She had no prior history or diagnosis of cancer. Her age appropriate screening programs are up-to-date. She complained of pica but eats a variety of diet. She never donated blood or received blood transfusion The patient was prescribed oral iron supplements and she takes 1 daily but tolerated that poorly. She has excessive menorrhagia monthly and was told she has fibroids/ovarian cysts. She had thyroid surgery in the past and complained of chronic fatigue She received multiple doses of IV iron in 2016 & 2017 and March 2018 with improvement of energy level She had EGD and colonoscopy in 2018 which showed no evidence of bleeding lesions She has evaluation and endometrial biopsy in 2019 that came back benign In 2022, she underwent endometrial polyp removal and ablation  I have reviewed the past medical history, past surgical history, social history and family history with the patient and they are unchanged from previous note.  ALLERGIES:  is allergic to codeine, darvocet [propoxyphene n-acetaminophen], percocet [oxycodone-acetaminophen], tylenol [acetaminophen], and mirabegron.  MEDICATIONS:  Current Outpatient Medications  Medication Sig Dispense Refill  . albuterol (PROVENTIL HFA;VENTOLIN HFA) 108 (90 Base) MCG/ACT inhaler INL 2 PFS PO QID PRN    . ALPRAZolam (XANAX) 0.5 MG  tablet TK 1 T PO TWO TIMES DAILY AS NEED    . cetirizine (ZYRTEC) 10 MG tablet Take 1 tablet (10 mg total) by mouth every  morning. (Patient taking differently: Take 10 mg by mouth daily as needed.) 34 tablet 5  . clindamycin (CLINDAGEL) 1 % gel APP EXT AA BID    . clotrimazole-betamethasone (LOTRISONE) cream APP EXT AA BID    . cyclobenzaprine (FLEXERIL) 10 MG tablet as needed.     . diclofenac (VOLTAREN) 50 MG EC tablet TK 1 T PO TID PRN  1  . diltiazem 2 % GEL Apply 1 application topically 2 (two) times daily. 30 g 1  . EPINEPHrine (EPIPEN 2-PAK) 0.3 mg/0.3 mL IJ SOAJ injection Use as directed for severe allergic reactions 2 Device 2  . etonogestrel-ethinyl estradiol (NUVARING) 0.12-0.015 MG/24HR vaginal ring Insert into vagina for 21 days, then remove for  Seven days and replace with new ring    . famotidine (PEPCID) 20 MG tablet Take 20 mg by mouth 2 (two) times daily.    . fluticasone (FLONASE) 50 MCG/ACT nasal spray SHAKE LQ AND U 2 SPRAYS IEN D    . fluticasone (FLOVENT HFA) 110 MCG/ACT inhaler Inhale 2 puffs into the lungs 2 (two) times daily. 1 Inhaler 5  . folic acid (FOLVITE) 149 MCG tablet Take 400 mcg by mouth daily.    . furosemide (LASIX) 40 MG tablet TK 1 T PO  every other day  1  . hydrOXYzine (ATARAX/VISTARIL) 25 MG tablet Take 1 tablet (25 mg total) by mouth at bedtime. 34 tablet 5  . loratadine (CLARITIN) 10 MG tablet TK 1 T PO D PRN    . montelukast (SINGULAIR) 10 MG tablet TAKE 1 TABLET BY MOUTH DAILY TO PREVENT COUGH OR WHEEZING 300 tablet 0  . ondansetron (ZOFRAN-ODT) 8 MG disintegrating tablet DIS 1 T ON THE TONGUE Q 8 H PRN  2  . promethazine (PHENERGAN) 50 MG tablet TK 1 T PO BID    . thyroid (ARMOUR) 120 MG tablet Take 1 tablet by mouth daily.    Marland Kitchen thyroid (ARMOUR) 15 MG tablet Take 1 tablet by mouth daily.    . traMADol (ULTRAM) 50 MG tablet TAKE ONE TO TWO TABLETS BY MOUTH FOUR TIMES DAILY AS NEEDED    . Vitamin D, Ergocalciferol, (DRISDOL) 50000 units CAPS capsule Take 50,000 Units by mouth every 7 (seven) days.      No current facility-administered medications for this visit.      REVIEW OF SYSTEMS:   Constitutional: Denies fevers, chills or night sweats Eyes: Denies blurriness of vision Ears, nose, mouth, throat, and face: Denies mucositis or sore throat Respiratory: Denies cough, dyspnea or wheezes Cardiovascular: Denies palpitation, chest discomfort or lower extremity swelling Gastrointestinal:  Denies nausea, heartburn or change in bowel habits Skin: Denies abnormal skin rashes Lymphatics: Denies new lymphadenopathy or easy bruising Neurological:Denies numbness, tingling or new weaknesses Behavioral/Psych: Mood is stable, no new changes  All other systems were reviewed with the patient and are negative.  PHYSICAL EXAMINATION: ECOG PERFORMANCE STATUS: 1 - Symptomatic but completely ambulatory  Vitals:   08/06/20 1052  BP: (!) 133/95  Pulse: 76  Resp: 18  Temp: 97.7 F (36.5 C)  SpO2: 100%   Filed Weights   08/06/20 1052  Weight: 240 lb 4.8 oz (109 kg)    GENERAL:alert, no distress and comfortable NEURO: alert & oriented x 3 with fluent speech, no focal motor/sensory deficits  LABORATORY DATA:  I have reviewed  the data as listed     Component Value Date/Time   NA 139 02/13/2015 1815   K 3.7 02/13/2015 1815   CL 104 02/13/2015 1815   CO2 28 02/13/2015 1815   GLUCOSE 84 02/13/2015 1815   BUN 12 02/13/2015 1815   CREATININE 0.92 02/13/2015 1815   CALCIUM 9.2 02/13/2015 1815   PROT 7.6 11/13/2014 0720   ALBUMIN 3.5 11/13/2014 0720   AST 21 11/13/2014 0720   ALT 12 (L) 11/13/2014 0720   ALKPHOS 54 11/13/2014 0720   BILITOT 0.1 (L) 11/13/2014 0720   GFRNONAA >60 02/13/2015 1815   GFRAA >60 02/13/2015 1815    No results found for: SPEP, UPEP  Lab Results  Component Value Date   WBC 5.4 08/06/2020   NEUTROABS 3.0 08/06/2020   HGB 12.2 08/06/2020   HCT 37.5 08/06/2020   MCV 89.1 08/06/2020   PLT 339 08/06/2020      Chemistry      Component Value Date/Time   NA 139 02/13/2015 1815   K 3.7 02/13/2015 1815   CL 104 02/13/2015  1815   CO2 28 02/13/2015 1815   BUN 12 02/13/2015 1815   CREATININE 0.92 02/13/2015 1815      Component Value Date/Time   CALCIUM 9.2 02/13/2015 1815   ALKPHOS 54 11/13/2014 0720   AST 21 11/13/2014 0720   ALT 12 (L) 11/13/2014 0720   BILITOT 0.1 (L) 11/13/2014 0720

## 2020-11-04 ENCOUNTER — Telehealth: Payer: Self-pay | Admitting: Hematology and Oncology

## 2020-11-04 NOTE — Telephone Encounter (Signed)
R/s appts per 5/31 sch msg. Pt aware.  

## 2020-11-06 ENCOUNTER — Ambulatory Visit: Payer: Medicaid Other | Admitting: Hematology and Oncology

## 2020-11-06 ENCOUNTER — Ambulatory Visit: Payer: Medicaid Other

## 2020-11-06 ENCOUNTER — Other Ambulatory Visit: Payer: Medicaid Other

## 2020-11-18 ENCOUNTER — Ambulatory Visit: Payer: Self-pay | Attending: Critical Care Medicine

## 2020-11-18 DIAGNOSIS — Z20822 Contact with and (suspected) exposure to covid-19: Secondary | ICD-10-CM

## 2020-11-20 LAB — NOVEL CORONAVIRUS, NAA

## 2020-11-28 ENCOUNTER — Encounter: Payer: Self-pay | Admitting: Hematology and Oncology

## 2020-11-28 ENCOUNTER — Telehealth: Payer: Self-pay | Admitting: Hematology and Oncology

## 2020-11-28 ENCOUNTER — Inpatient Hospital Stay: Payer: Medicaid Other | Attending: Hematology and Oncology

## 2020-11-28 ENCOUNTER — Inpatient Hospital Stay (HOSPITAL_BASED_OUTPATIENT_CLINIC_OR_DEPARTMENT_OTHER): Payer: Medicaid Other | Admitting: Hematology and Oncology

## 2020-11-28 ENCOUNTER — Other Ambulatory Visit: Payer: Self-pay

## 2020-11-28 ENCOUNTER — Inpatient Hospital Stay: Payer: Medicaid Other

## 2020-11-28 VITALS — BP 139/78 | HR 70 | Temp 98.0°F | Resp 17 | Ht 67.0 in

## 2020-11-28 DIAGNOSIS — D509 Iron deficiency anemia, unspecified: Secondary | ICD-10-CM | POA: Diagnosis not present

## 2020-11-28 DIAGNOSIS — D500 Iron deficiency anemia secondary to blood loss (chronic): Secondary | ICD-10-CM | POA: Insufficient documentation

## 2020-11-28 DIAGNOSIS — N920 Excessive and frequent menstruation with regular cycle: Secondary | ICD-10-CM | POA: Diagnosis not present

## 2020-11-28 DIAGNOSIS — N83209 Unspecified ovarian cyst, unspecified side: Secondary | ICD-10-CM | POA: Diagnosis not present

## 2020-11-28 LAB — CBC WITH DIFFERENTIAL/PLATELET
Abs Immature Granulocytes: 0 10*3/uL (ref 0.00–0.07)
Basophils Absolute: 0.1 10*3/uL (ref 0.0–0.1)
Basophils Relative: 1 %
Eosinophils Absolute: 0.1 10*3/uL (ref 0.0–0.5)
Eosinophils Relative: 2 %
HCT: 25.9 % — ABNORMAL LOW (ref 36.0–46.0)
Hemoglobin: 8.1 g/dL — ABNORMAL LOW (ref 12.0–15.0)
Immature Granulocytes: 0 %
Lymphocytes Relative: 33 %
Lymphs Abs: 1.9 10*3/uL (ref 0.7–4.0)
MCH: 23.5 pg — ABNORMAL LOW (ref 26.0–34.0)
MCHC: 31.3 g/dL (ref 30.0–36.0)
MCV: 75.3 fL — ABNORMAL LOW (ref 80.0–100.0)
Monocytes Absolute: 0.5 10*3/uL (ref 0.1–1.0)
Monocytes Relative: 9 %
Neutro Abs: 3.1 10*3/uL (ref 1.7–7.7)
Neutrophils Relative %: 55 %
Platelets: 333 10*3/uL (ref 150–400)
RBC: 3.44 MIL/uL — ABNORMAL LOW (ref 3.87–5.11)
RDW: 15.2 % (ref 11.5–15.5)
WBC: 5.6 10*3/uL (ref 4.0–10.5)
nRBC: 0 % (ref 0.0–0.2)

## 2020-11-28 LAB — IRON AND TIBC
Iron: 10 ug/dL — ABNORMAL LOW (ref 41–142)
Saturation Ratios: 3 % — ABNORMAL LOW (ref 21–57)
TIBC: 390 ug/dL (ref 236–444)
UIBC: 380 ug/dL (ref 120–384)

## 2020-11-28 LAB — FERRITIN: Ferritin: 10 ng/mL — ABNORMAL LOW (ref 11–307)

## 2020-11-28 MED ORDER — SODIUM CHLORIDE 0.9 % IV SOLN
510.0000 mg | Freq: Once | INTRAVENOUS | Status: AC
  Administered 2020-11-28: 510 mg via INTRAVENOUS
  Filled 2020-11-28: qty 510

## 2020-11-28 NOTE — Progress Notes (Signed)
[facility] OFFICE PROGRESS NOTE  Patient, No Pcp Per (Inactive)  ASSESSMENT & PLAN:  Iron deficiency anemia She has severe, recurrent anemia since her last visit She has dropped 4 g of hemoglobin in a span of 3 months Despite recent endometrial ablation, she has recent vaginal bleeding but she denies her vaginal bleeding being excessively heavy We discussed the risk and benefits of referral back to GI I have reviewed her prior EGD and colonoscopy report She is in agreement for referral back to see Dr. Henrene Pastor for repeat endoscopy evaluation to rule out internal bleeding  The most likely cause of her anemia is due to chronic blood loss/malabsorption syndrome. We discussed some of the risks, benefits, and alternatives of intravenous iron infusions. The patient is symptomatic from anemia and the iron level is critically low. She tolerated oral iron supplement poorly and desires to achieved higher levels of iron faster for adequate hematopoesis. Some of the side-effects to be expected including risks of infusion reactions, phlebitis, headaches, nausea and fatigue.  The patient is willing to proceed. Patient education material was dispensed.  Goal is to keep ferritin level greater than 50 and resolution of anemia Recommend we proceed with IV iron today as scheduled and I recommend at least 2 more doses of IV iron given the severity of her anemia I will see her again in 3 months for further follow-up  Menorrhagia with regular cycle She continues to have persistent bleeding despite endometrial ablation I recommend consideration for her to return back to her gynecologist for further evaluation In the meantime, I will also refer her back to GI for evaluation  Orders Placed This Encounter  Procedures   Iron and TIBC    Standing Status:   Standing    Number of Occurrences:   3    Standing Expiration Date:   11/28/2021   Ferritin    Standing Status:   Standing    Number of  Occurrences:   3    Standing Expiration Date:   11/28/2021   Ambulatory referral to Gastroenterology    Referral Priority:   Routine    Referral Type:   Consultation    Referral Reason:   Specialty Services Required    Referred to Provider:   Irene Shipper, MD    Number of Visits Requested:   1    The total time spent in the appointment was 20 minutes encounter with patients including review of chart and various tests results, discussions about plan of care and coordination of care plan   All questions were answered. The patient knows to call the clinic with any problems, questions or concerns. No barriers to learning was detected.    Heath Lark, MD 6/24/20229:15 AM  INTERVAL HISTORY: Nicole Nicholson 40 y.o. female returns for further follow-up on chronic severe iron deficiency anemia Since last time she was seen, she complained of fatigue She started to have pica with excessive chewing of ice starting a month ago She continues to have regular menstruation every month but she did not feel it was particularly heavy She admits she does not eat much meat The patient denies any recent signs or symptoms of bleeding such as spontaneous epistaxis, hematuria or hematochezia.  SUMMARY OF HEMATOLOGIC HISTORY:  Nicole Nicholson is seen here for severe symptomatic anemia and severe fatigue  She was found to have abnormal CBC from recent ER/urgent care visit. She has "anemia all her life". She had multiple pregnancies in the past and was  told she was anemic She denies recent chest pain on exertion, but does have shortness of breath on minimal exertion, pre-syncopal episodes, or palpitations. She had not noticed any recent bleeding such as epistaxis, hematuria or hematochezia The patient has occasional over the counter NSAID ingestion. She is not on antiplatelets agents.  She had no prior history or diagnosis of cancer. Her age appropriate screening programs are up-to-date. She complained of pica  but eats a variety of diet. She never donated blood or received blood transfusion The patient was prescribed oral iron supplements and she takes 1 daily but tolerated that poorly. She has excessive menorrhagia monthly and was told she has fibroids/ovarian cysts. She had thyroid surgery in the past and complained of chronic fatigue She received multiple doses of IV iron in 2016 & 2017 and March 2018 with improvement of energy level She had EGD and colonoscopy in 2018 which showed no evidence of bleeding lesions She has evaluation and endometrial biopsy in 2019 that came back benign In 2022, she underwent endometrial polyp removal and ablation  I have reviewed the past medical history, past surgical history, social history and family history with the patient and they are unchanged from previous note.  ALLERGIES:  is allergic to codeine, darvocet [propoxyphene n-acetaminophen], percocet [oxycodone-acetaminophen], tylenol [acetaminophen], and mirabegron.  MEDICATIONS:  Current Outpatient Medications  Medication Sig Dispense Refill   albuterol (PROVENTIL HFA;VENTOLIN HFA) 108 (90 Base) MCG/ACT inhaler INL 2 PFS PO QID PRN     ALPRAZolam (XANAX) 0.5 MG tablet TK 1 T PO TWO TIMES DAILY AS NEED     cetirizine (ZYRTEC) 10 MG tablet Take 1 tablet (10 mg total) by mouth every morning. (Patient taking differently: Take 10 mg by mouth daily as needed.) 34 tablet 5   clindamycin (CLINDAGEL) 1 % gel APP EXT AA BID     clotrimazole-betamethasone (LOTRISONE) cream APP EXT AA BID     cyclobenzaprine (FLEXERIL) 10 MG tablet as needed.      diclofenac (VOLTAREN) 50 MG EC tablet TK 1 T PO TID PRN  1   diltiazem 2 % GEL Apply 1 application topically 2 (two) times daily. 30 g 1   EPINEPHrine (EPIPEN 2-PAK) 0.3 mg/0.3 mL IJ SOAJ injection Use as directed for severe allergic reactions 2 Device 2   etonogestrel-ethinyl estradiol (NUVARING) 0.12-0.015 MG/24HR vaginal ring Insert into vagina for 21 days, then remove  for  Seven days and replace with new ring     famotidine (PEPCID) 20 MG tablet Take 20 mg by mouth 2 (two) times daily.     fluticasone (FLONASE) 50 MCG/ACT nasal spray SHAKE LQ AND U 2 SPRAYS IEN D     fluticasone (FLOVENT HFA) 110 MCG/ACT inhaler Inhale 2 puffs into the lungs 2 (two) times daily. 1 Inhaler 5   folic acid (FOLVITE) 751 MCG tablet Take 400 mcg by mouth daily.     furosemide (LASIX) 40 MG tablet TK 1 T PO  every other day  1   hydrOXYzine (ATARAX/VISTARIL) 25 MG tablet Take 1 tablet (25 mg total) by mouth at bedtime. 34 tablet 5   loratadine (CLARITIN) 10 MG tablet TK 1 T PO D PRN     montelukast (SINGULAIR) 10 MG tablet TAKE 1 TABLET BY MOUTH DAILY TO PREVENT COUGH OR WHEEZING 300 tablet 0   ondansetron (ZOFRAN-ODT) 8 MG disintegrating tablet DIS 1 T ON THE TONGUE Q 8 H PRN  2   promethazine (PHENERGAN) 50 MG tablet TK 1 T PO BID  thyroid (ARMOUR) 120 MG tablet Take 1 tablet by mouth daily.     thyroid (ARMOUR) 15 MG tablet Take 1 tablet by mouth daily.     traMADol (ULTRAM) 50 MG tablet TAKE ONE TO TWO TABLETS BY MOUTH FOUR TIMES DAILY AS NEEDED     Vitamin D, Ergocalciferol, (DRISDOL) 50000 units CAPS capsule Take 50,000 Units by mouth every 7 (seven) days.      No current facility-administered medications for this visit.   Facility-Administered Medications Ordered in Other Visits  Medication Dose Route Frequency Provider Last Rate Last Admin   ferumoxytol (FERAHEME) 510 mg in sodium chloride 0.9 % 100 mL IVPB  510 mg Intravenous Once Alvy Bimler, Syanna Remmert, MD         REVIEW OF SYSTEMS:   Constitutional: Denies fevers, chills or night sweats Eyes: Denies blurriness of vision Ears, nose, mouth, throat, and face: Denies mucositis or sore throat Respiratory: Denies cough, dyspnea or wheezes Cardiovascular: Denies palpitation, chest discomfort or lower extremity swelling Gastrointestinal:  Denies nausea, heartburn or change in bowel habits Skin: Denies abnormal skin  rashes Lymphatics: Denies new lymphadenopathy or easy bruising Neurological:Denies numbness, tingling or new weaknesses Behavioral/Psych: Mood is stable, no new changes  All other systems were reviewed with the patient and are negative.  PHYSICAL EXAMINATION: ECOG PERFORMANCE STATUS: 1 - Symptomatic but completely ambulatory  Vitals:   11/28/20 0840  BP: 139/78  Pulse: 70  Resp: 17  Temp: 98 F (36.7 C)  SpO2: 100%   There were no vitals filed for this visit.  GENERAL:alert, no distress and comfortable SKIN: skin color, texture, turgor are normal, no rashes or significant lesions EYES: normal, Conjunctiva are pink and non-injected, sclera clear OROPHARYNX:no exudate, no erythema and lips, buccal mucosa, and tongue normal  NECK: supple, thyroid normal size, non-tender, without nodularity LYMPH:  no palpable lymphadenopathy in the cervical, axillary or inguinal LUNGS: clear to auscultation and percussion with normal breathing effort HEART: regular rate & rhythm and no murmurs and no lower extremity edema ABDOMEN:abdomen soft, non-tender and normal bowel sounds Musculoskeletal:no cyanosis of digits and no clubbing  NEURO: alert & oriented x 3 with fluent speech, no focal motor/sensory deficits  LABORATORY DATA:  I have reviewed the data as listed     Component Value Date/Time   NA 139 02/13/2015 1815   K 3.7 02/13/2015 1815   CL 104 02/13/2015 1815   CO2 28 02/13/2015 1815   GLUCOSE 84 02/13/2015 1815   BUN 12 02/13/2015 1815   CREATININE 0.92 02/13/2015 1815   CALCIUM 9.2 02/13/2015 1815   PROT 7.6 11/13/2014 0720   ALBUMIN 3.5 11/13/2014 0720   AST 21 11/13/2014 0720   ALT 12 (L) 11/13/2014 0720   ALKPHOS 54 11/13/2014 0720   BILITOT 0.1 (L) 11/13/2014 0720   GFRNONAA >60 02/13/2015 1815   GFRAA >60 02/13/2015 1815    No results found for: SPEP, UPEP  Lab Results  Component Value Date   WBC 5.6 11/28/2020   NEUTROABS 3.1 11/28/2020   HGB 8.1 (L) 11/28/2020    HCT 25.9 (L) 11/28/2020   MCV 75.3 (L) 11/28/2020   PLT 333 11/28/2020      Chemistry      Component Value Date/Time   NA 139 02/13/2015 1815   K 3.7 02/13/2015 1815   CL 104 02/13/2015 1815   CO2 28 02/13/2015 1815   BUN 12 02/13/2015 1815   CREATININE 0.92 02/13/2015 1815      Component Value Date/Time  CALCIUM 9.2 02/13/2015 1815   ALKPHOS 54 11/13/2014 0720   AST 21 11/13/2014 0720   ALT 12 (L) 11/13/2014 0720   BILITOT 0.1 (L) 11/13/2014 0720

## 2020-11-28 NOTE — Assessment & Plan Note (Signed)
She has severe, recurrent anemia since her last visit She has dropped 4 g of hemoglobin in a span of 3 months Despite recent endometrial ablation, she has recent vaginal bleeding but she denies her vaginal bleeding being excessively heavy We discussed the risk and benefits of referral back to GI I have reviewed her prior EGD and colonoscopy report She is in agreement for referral back to see Dr. Henrene Pastor for repeat endoscopy evaluation to rule out internal bleeding  The most likely cause of her anemia is due to chronic blood loss/malabsorption syndrome. We discussed some of the risks, benefits, and alternatives of intravenous iron infusions. The patient is symptomatic from anemia and the iron level is critically low. She tolerated oral iron supplement poorly and desires to achieved higher levels of iron faster for adequate hematopoesis. Some of the side-effects to be expected including risks of infusion reactions, phlebitis, headaches, nausea and fatigue.  The patient is willing to proceed. Patient education material was dispensed.  Goal is to keep ferritin level greater than 50 and resolution of anemia Recommend we proceed with IV iron today as scheduled and I recommend at least 2 more doses of IV iron given the severity of her anemia I will see her again in 3 months for further follow-up

## 2020-11-28 NOTE — Telephone Encounter (Signed)
Scheduled appointment per 06/24 sch msg. Patient is aware.

## 2020-11-28 NOTE — Assessment & Plan Note (Signed)
She continues to have persistent bleeding despite endometrial ablation I recommend consideration for her to return back to her gynecologist for further evaluation In the meantime, I will also refer her back to GI for evaluation

## 2020-11-28 NOTE — Patient Instructions (Signed)
Ferumoxytol injection What is this medicine? FERUMOXYTOL is an iron complex. Iron is used to make healthy red blood cells, which carry oxygen and nutrients throughout the body. This medicine is used to treat iron deficiency anemia. This medicine may be used for other purposes; ask your health care provider or pharmacist if you have questions. COMMON BRAND NAME(S): Feraheme What should I tell my health care provider before I take this medicine? They need to know if you have any of these conditions:  anemia not caused by low iron levels  high levels of iron in the blood  magnetic resonance imaging (MRI) test scheduled  an unusual or allergic reaction to iron, other medicines, foods, dyes, or preservatives  pregnant or trying to get pregnant  breast-feeding How should I use this medicine? This medicine is for injection into a vein. It is given by a health care professional in a hospital or clinic setting. Talk to your pediatrician regarding the use of this medicine in children. Special care may be needed. Overdosage: If you think you have taken too much of this medicine contact a poison control center or emergency room at once. NOTE: This medicine is only for you. Do not share this medicine with others. What if I miss a dose? It is important not to miss your dose. Call your doctor or health care professional if you are unable to keep an appointment. What may interact with this medicine? This medicine may interact with the following medications:  other iron products This list may not describe all possible interactions. Give your health care provider a list of all the medicines, herbs, non-prescription drugs, or dietary supplements you use. Also tell them if you smoke, drink alcohol, or use illegal drugs. Some items may interact with your medicine. What should I watch for while using this medicine? Visit your doctor or healthcare professional regularly. Tell your doctor or healthcare  professional if your symptoms do not start to get better or if they get worse. You may need blood work done while you are taking this medicine. You may need to follow a special diet. Talk to your doctor. Foods that contain iron include: whole grains/cereals, dried fruits, beans, or peas, leafy green vegetables, and organ meats (liver, kidney). What side effects may I notice from receiving this medicine? Side effects that you should report to your doctor or health care professional as soon as possible:  allergic reactions like skin rash, itching or hives, swelling of the face, lips, or tongue  breathing problems  changes in blood pressure  feeling faint or lightheaded, falls  fever or chills  flushing, sweating, or hot feelings  swelling of the ankles or feet Side effects that usually do not require medical attention (report to your doctor or health care professional if they continue or are bothersome):  diarrhea  headache  nausea, vomiting  stomach pain This list may not describe all possible side effects. Call your doctor for medical advice about side effects. You may report side effects to FDA at 1-800-FDA-1088. Where should I keep my medicine? This drug is given in a hospital or clinic and will not be stored at home. NOTE: This sheet is a summary. It may not cover all possible information. If you have questions about this medicine, talk to your doctor, pharmacist, or health care provider.  2021 Elsevier/Gold Standard (2016-07-12 20:21:10)  

## 2020-12-15 ENCOUNTER — Ambulatory Visit: Payer: Medicaid Other

## 2020-12-18 ENCOUNTER — Encounter: Payer: Self-pay | Admitting: Hematology and Oncology

## 2020-12-18 ENCOUNTER — Other Ambulatory Visit: Payer: Self-pay

## 2020-12-18 ENCOUNTER — Inpatient Hospital Stay: Payer: Medicaid Other | Attending: Hematology and Oncology

## 2020-12-18 VITALS — BP 148/82 | HR 71 | Temp 98.2°F | Resp 16

## 2020-12-18 DIAGNOSIS — D509 Iron deficiency anemia, unspecified: Secondary | ICD-10-CM | POA: Insufficient documentation

## 2020-12-18 MED ORDER — SODIUM CHLORIDE 0.9 % IV SOLN
510.0000 mg | Freq: Once | INTRAVENOUS | Status: AC
  Administered 2020-12-18: 510 mg via INTRAVENOUS
  Filled 2020-12-18: qty 510

## 2020-12-18 NOTE — Patient Instructions (Signed)
Ferumoxytol injection What is this medication? FERUMOXYTOL is an iron complex. Iron is used to make healthy red blood cells, which carry oxygen and nutrients throughout the body. This medicine is used totreat iron deficiency anemia. This medicine may be used for other purposes; ask your health care provider orpharmacist if you have questions. COMMON BRAND NAME(S): Feraheme What should I tell my care team before I take this medication? They need to know if you have any of these conditions: anemia not caused by low iron levels high levels of iron in the blood magnetic resonance imaging (MRI) test scheduled an unusual or allergic reaction to iron, other medicines, foods, dyes, or preservatives pregnant or trying to get pregnant breast-feeding How should I use this medication? This medicine is for injection into a vein. It is given by a health careprofessional in a hospital or clinic setting. Talk to your pediatrician regarding the use of this medicine in children.Special care may be needed. Overdosage: If you think you have taken too much of this medicine contact apoison control center or emergency room at once. NOTE: This medicine is only for you. Do not share this medicine with others. What if I miss a dose? It is important not to miss your dose. Call your doctor or health careprofessional if you are unable to keep an appointment. What may interact with this medication? This medicine may interact with the following medications: other iron products This list may not describe all possible interactions. Give your health care provider a list of all the medicines, herbs, non-prescription drugs, or dietary supplements you use. Also tell them if you smoke, drink alcohol, or use illegaldrugs. Some items may interact with your medicine. What should I watch for while using this medication? Visit your doctor or healthcare professional regularly. Tell your doctor or healthcare professional if your  symptoms do not start to get better or if theyget worse. You may need blood work done while you are taking this medicine. You may need to follow a special diet. Talk to your doctor. Foods that contain iron include: whole grains/cereals, dried fruits, beans, or peas, leafy greenvegetables, and organ meats (liver, kidney). What side effects may I notice from receiving this medication? Side effects that you should report to your doctor or health care professionalas soon as possible: allergic reactions like skin rash, itching or hives, swelling of the face, lips, or tongue breathing problems changes in blood pressure feeling faint or lightheaded, falls fever or chills flushing, sweating, or hot feelings swelling of the ankles or feet Side effects that usually do not require medical attention (report to yourdoctor or health care professional if they continue or are bothersome): diarrhea headache nausea, vomiting stomach pain This list may not describe all possible side effects. Call your doctor for medical advice about side effects. You may report side effects to FDA at1-800-FDA-1088. Where should I keep my medication? This drug is given in a hospital or clinic and will not be stored at home. NOTE: This sheet is a summary. It may not cover all possible information. If you have questions about this medicine, talk to your doctor, pharmacist, orhealth care provider.  2022 Elsevier/Gold Standard (2016-07-12 20:21:10)  

## 2020-12-22 ENCOUNTER — Other Ambulatory Visit: Payer: Self-pay

## 2020-12-22 ENCOUNTER — Inpatient Hospital Stay: Payer: Medicaid Other

## 2020-12-22 VITALS — BP 135/74 | HR 73 | Temp 98.0°F | Resp 18

## 2020-12-22 DIAGNOSIS — D509 Iron deficiency anemia, unspecified: Secondary | ICD-10-CM

## 2020-12-22 MED ORDER — SODIUM CHLORIDE 0.9 % IV SOLN
INTRAVENOUS | Status: DC
  Filled 2020-12-22: qty 250

## 2020-12-22 MED ORDER — SODIUM CHLORIDE 0.9 % IV SOLN
510.0000 mg | Freq: Once | INTRAVENOUS | Status: AC
  Administered 2020-12-22: 510 mg via INTRAVENOUS
  Filled 2020-12-22: qty 510

## 2020-12-22 NOTE — Progress Notes (Signed)
Patient declined to stay for 30-minute post observation. 

## 2020-12-22 NOTE — Patient Instructions (Signed)
[facility]  Discharge Instructions: Thank you for choosing [facility] to provide your oncology and hematology care.   If you have a lab appointment with the [facility], please go directly to the [facility] and check in at the registration area.   Wear comfortable clothing and clothing appropriate for easy access to any Portacath or PICC line.   We strive to give you quality time with your provider. You may need to reschedule your appointment if you arrive late (15 or more minutes).  Arriving late affects you and other patients whose appointments are after yours.  Also, if you miss three or more appointments without notifying the office, you may be dismissed from the clinic at the provider's discretion.      For prescription refill requests, have your pharmacy contact our office and allow 72 hours for refills to be completed.    Today you received the following medication - Feraheme      To help prevent nausea and vomiting after your treatment, we encourage you to take your nausea medication as directed.  BELOW ARE SYMPTOMS THAT SHOULD BE REPORTED IMMEDIATELY: *FEVER GREATER THAN 100.4 F (38 C) OR HIGHER *CHILLS OR SWEATING *NAUSEA AND VOMITING THAT IS NOT CONTROLLED WITH YOUR NAUSEA MEDICATION *UNUSUAL SHORTNESS OF BREATH *UNUSUAL BRUISING OR BLEEDING *URINARY PROBLEMS (pain or burning when urinating, or frequent urination) *BOWEL PROBLEMS (unusual diarrhea, constipation, pain near the anus) TENDERNESS IN MOUTH AND THROAT WITH OR WITHOUT PRESENCE OF ULCERS (sore throat, sores in mouth, or a toothache) UNUSUAL RASH, SWELLING OR PAIN  UNUSUAL VAGINAL DISCHARGE OR ITCHING   Items with * indicate a potential emergency and should be followed up as soon as possible or go to the Emergency Department if any problems should occur.  Please show the CHEMOTHERAPY ALERT CARD or IMMUNOTHERAPY ALERT CARD at check-in to the Emergency Department and  triage nurse.  Should you have questions after your visit or need to cancel or reschedule your appointment, please contact [facility]  Dept: [phone]  and follow the prompts.  Office hours are 8:00 a.m. to 4:30 p.m. Monday - Friday. Please note that voicemails left after 4:00 p.m. may not be returned until the following business day.  We are closed weekends and major holidays. You have access to a nurse at all times for urgent questions. Please call the main number to the clinic Dept: [phone] and follow the prompts.   For any non-urgent questions, you may also contact your provider using MyChart. We now offer e-Visits for anyone 17 and older to request care online for non-urgent symptoms. For details visit [email or web address].   Also download the MyChart app! Go to the app store, search "MyChart", open the app, select [facility], and log in with your MyChart username and password.  Due to Covid, a mask is required upon entering the hospital/clinic. If you do not have a mask, one will be given to you upon arrival. For doctor visits, patients may have 1 support person aged 67 or older with them. For treatment visits, patients cannot have anyone with them due to current Covid guidelines and our immunocompromised population.   Ferumoxytol injection What is this medication? FERUMOXYTOL is an iron complex. Iron is used to make healthy red blood cells, which carry oxygen and nutrients throughout the body. This medicine is used totreat iron deficiency anemia. This medicine may be used for other purposes; ask your health care provider orpharmacist if  you have questions. COMMON BRAND NAME(S): Feraheme What should I tell my care team before I take this medication? They need to know if you have any of these conditions: anemia not caused by low iron levels high levels of iron in the blood magnetic resonance imaging (MRI) test scheduled an unusual or allergic  reaction to iron, other medicines, foods, dyes, or preservatives pregnant or trying to get pregnant breast-feeding How should I use this medication? This medicine is for injection into a vein. It is given by a health careprofessional in a hospital or clinic setting. Talk to your pediatrician regarding the use of this medicine in children.Special care may be needed. Overdosage: If you think you have taken too much of this medicine contact apoison control center or emergency room at once. NOTE: This medicine is only for you. Do not share this medicine with others. What if I miss a dose? It is important not to miss your dose. Call your doctor or health careprofessional if you are unable to keep an appointment. What may interact with this medication? This medicine may interact with the following medications: other iron products This list may not describe all possible interactions. Give your health care provider a list of all the medicines, herbs, non-prescription drugs, or dietary supplements you use. Also tell them if you smoke, drink alcohol, or use illegaldrugs. Some items may interact with your medicine. What should I watch for while using this medication? Visit your doctor or healthcare professional regularly. Tell your doctor or healthcare professional if your symptoms do not start to get better or if theyget worse. You may need blood work done while you are taking this medicine. You may need to follow a special diet. Talk to your doctor. Foods that contain iron include: whole grains/cereals, dried fruits, beans, or peas, leafy greenvegetables, and organ meats (liver, kidney). What side effects may I notice from receiving this medication? Side effects that you should report to your doctor or health care professionalas soon as possible: allergic reactions like skin rash, itching or hives, swelling of the face, lips, or tongue breathing problems changes in blood pressure feeling faint or  lightheaded, falls fever or chills flushing, sweating, or hot feelings swelling of the ankles or feet Side effects that usually do not require medical attention (report to yourdoctor or health care professional if they continue or are bothersome): diarrhea headache nausea, vomiting stomach pain This list may not describe all possible side effects. Call your doctor for medical advice about side effects. You may report side effects to FDA at1-800-FDA-1088. Where should I keep my medication? This drug is given in a hospital or clinic and will not be stored at home. NOTE: This sheet is a summary. It may not cover all possible information. If you have questions about this medicine, talk to your doctor, pharmacist, orhealth care provider.  2022 Elsevier/Gold Standard (2016-07-12 20:21:10)

## 2021-02-27 ENCOUNTER — Inpatient Hospital Stay: Payer: Medicaid Other

## 2021-02-27 ENCOUNTER — Other Ambulatory Visit: Payer: Self-pay

## 2021-02-27 ENCOUNTER — Inpatient Hospital Stay: Payer: Medicaid Other | Attending: Hematology and Oncology

## 2021-02-27 ENCOUNTER — Encounter: Payer: Self-pay | Admitting: Hematology and Oncology

## 2021-02-27 ENCOUNTER — Inpatient Hospital Stay (HOSPITAL_BASED_OUTPATIENT_CLINIC_OR_DEPARTMENT_OTHER): Payer: Medicaid Other | Admitting: Hematology and Oncology

## 2021-02-27 VITALS — BP 136/85 | HR 69 | Temp 98.3°F

## 2021-02-27 DIAGNOSIS — D75838 Other thrombocytosis: Secondary | ICD-10-CM | POA: Diagnosis not present

## 2021-02-27 DIAGNOSIS — Z79899 Other long term (current) drug therapy: Secondary | ICD-10-CM | POA: Diagnosis not present

## 2021-02-27 DIAGNOSIS — N920 Excessive and frequent menstruation with regular cycle: Secondary | ICD-10-CM

## 2021-02-27 DIAGNOSIS — D500 Iron deficiency anemia secondary to blood loss (chronic): Secondary | ICD-10-CM | POA: Insufficient documentation

## 2021-02-27 DIAGNOSIS — D509 Iron deficiency anemia, unspecified: Secondary | ICD-10-CM | POA: Diagnosis not present

## 2021-02-27 LAB — CBC WITH DIFFERENTIAL/PLATELET
Abs Immature Granulocytes: 0.01 10*3/uL (ref 0.00–0.07)
Basophils Absolute: 0 10*3/uL (ref 0.0–0.1)
Basophils Relative: 0 %
Eosinophils Absolute: 0.1 10*3/uL (ref 0.0–0.5)
Eosinophils Relative: 2 %
HCT: 31.4 % — ABNORMAL LOW (ref 36.0–46.0)
Hemoglobin: 10.1 g/dL — ABNORMAL LOW (ref 12.0–15.0)
Immature Granulocytes: 0 %
Lymphocytes Relative: 36 %
Lymphs Abs: 1.8 10*3/uL (ref 0.7–4.0)
MCH: 27.7 pg (ref 26.0–34.0)
MCHC: 32.2 g/dL (ref 30.0–36.0)
MCV: 86.3 fL (ref 80.0–100.0)
Monocytes Absolute: 0.5 10*3/uL (ref 0.1–1.0)
Monocytes Relative: 9 %
Neutro Abs: 2.6 10*3/uL (ref 1.7–7.7)
Neutrophils Relative %: 53 %
Platelets: 416 10*3/uL — ABNORMAL HIGH (ref 150–400)
RBC: 3.64 MIL/uL — ABNORMAL LOW (ref 3.87–5.11)
RDW: 15.8 % — ABNORMAL HIGH (ref 11.5–15.5)
WBC: 5 10*3/uL (ref 4.0–10.5)
nRBC: 0 % (ref 0.0–0.2)

## 2021-02-27 LAB — IRON AND TIBC
Iron: 12 ug/dL — ABNORMAL LOW (ref 41–142)
Saturation Ratios: 3 % — ABNORMAL LOW (ref 21–57)
TIBC: 400 ug/dL (ref 236–444)
UIBC: 388 ug/dL — ABNORMAL HIGH (ref 120–384)

## 2021-02-27 LAB — FERRITIN: Ferritin: 18 ng/mL (ref 11–307)

## 2021-02-27 MED ORDER — SODIUM CHLORIDE 0.9 % IV SOLN: Freq: Once | INTRAVENOUS | Status: AC

## 2021-02-27 MED ORDER — SODIUM CHLORIDE 0.9 % IV SOLN
510.0000 mg | Freq: Once | INTRAVENOUS | Status: AC
  Administered 2021-02-27: 510 mg via INTRAVENOUS
  Filled 2021-02-27: qty 510

## 2021-02-27 NOTE — Assessment & Plan Note (Signed)
This is due to iron deficiency Observe for now

## 2021-02-27 NOTE — Progress Notes (Signed)
[place] OFFICE PROGRESS NOTE  Patient, No Pcp Per (Inactive)  ASSESSMENT & PLAN:  Iron deficiency anemia She has severe, recurrent anemia since her last visit Previously, I have referred her to Dr. Henrene Pastor from GI for repeat endoscopy but the patient told me she has not been contacted We will figure that out  The most likely cause of her anemia is due to chronic blood loss/malabsorption syndrome. We discussed some of the risks, benefits, and alternatives of intravenous iron infusions. The patient is symptomatic from anemia and the iron level is critically low. She tolerated oral iron supplement poorly and desires to achieved higher levels of iron faster for adequate hematopoesis. Some of the side-effects to be expected including risks of infusion reactions, phlebitis, headaches, nausea and fatigue.  The patient is willing to proceed. Patient education material was dispensed.  Goal is to keep ferritin level greater than 50 and resolution of anemia Recommend we proceed with IV iron today as scheduled and I recommend at least 2 more doses of IV iron given the severity of her anemia I will see her again in 3 months for further follow-up  Menorrhagia with regular cycle She is scheduled for [facility] on October 4 Will defer to gynecologist for management  Reactive thrombocytosis This is due to iron deficiency Observe for now  No orders of the defined types were placed in this encounter.   The total time spent in the appointment was 20 minutes encounter with patients including review of chart and various tests results, discussions about plan of care and coordination of care plan   All questions were answered. The patient knows to call the clinic with any problems, questions or concerns. No barriers to learning was detected.    Nicole Lark, MD 9/23/20229:04 AM  INTERVAL HISTORY: Nicole Nicholson 40 y.o. female returns for recurrent iron deficiency anemia She saw her gynecologist  recently I reviewed documentation She is currently scheduled for [facility] on October 4 From my last visit, I have reached out to Dr. Henrene Pastor Unfortunately, she has not been contacted about repeat endoscopy evaluation She denies visible occult blood in her stool No other forms of bleeding except for menorrhagia  SUMMARY OF HEMATOLOGIC HISTORY:  Nicole Nicholson is seen here for severe symptomatic anemia and severe fatigue  She was found to have abnormal CBC from recent ER/urgent care visit. She has "anemia all her life". She had multiple pregnancies in the past and was told she was anemic She denies recent chest pain on exertion, but does have shortness of breath on minimal exertion, pre-syncopal episodes, or palpitations. She had not noticed any recent bleeding such as epistaxis, hematuria or hematochezia The patient has occasional over the counter NSAID ingestion. She is not on antiplatelets agents.  She had no prior history or diagnosis of cancer. Her age appropriate screening programs are up-to-date. She complained of pica but eats a variety of diet. She never donated blood or received blood transfusion The patient was prescribed oral iron supplements and she takes 1 daily but tolerated that poorly. She has excessive menorrhagia monthly and was told she has fibroids/ovarian cysts. She had thyroid surgery in the past and complained of chronic fatigue She received multiple doses of IV iron in 2016 & 2017 and March 2018 with improvement of energy level She had EGD and colonoscopy in 2018 which showed no evidence of bleeding lesions She has evaluation and endometrial biopsy in 2019 that came back benign In 2022, she underwent endometrial polyp removal  and ablation  I have reviewed the past medical history, past surgical history, social history and family history with the patient and they are unchanged from previous note.  ALLERGIES:  is allergic to codeine, darvocet [propoxyphene  n-acetaminophen], percocet [oxycodone-acetaminophen], tylenol [acetaminophen], and mirabegron.  MEDICATIONS:  Current Outpatient Medications  Medication Sig Dispense Refill   albuterol (PROVENTIL HFA;VENTOLIN HFA) 108 (90 Base) MCG/ACT inhaler INL 2 PFS PO QID PRN     ALPRAZolam (XANAX) 0.5 MG tablet TK 1 T PO TWO TIMES DAILY AS NEED     cetirizine (ZYRTEC) 10 MG tablet Take 1 tablet (10 mg total) by mouth every morning. (Patient taking differently: Take 10 mg by mouth daily as needed.) 34 tablet 5   clindamycin (CLINDAGEL) 1 % gel APP EXT AA BID     clotrimazole-betamethasone (LOTRISONE) cream APP EXT AA BID     cyclobenzaprine (FLEXERIL) 10 MG tablet as needed.      diclofenac (VOLTAREN) 50 MG EC tablet TK 1 T PO TID PRN  1   diltiazem 2 % GEL Apply 1 application topically 2 (two) times daily. 30 g 1   EPINEPHrine (EPIPEN 2-PAK) 0.3 mg/0.3 mL IJ SOAJ injection Use as directed for severe allergic reactions 2 Device 2   etonogestrel-ethinyl estradiol (NUVARING) 0.12-0.015 MG/24HR vaginal ring Insert into vagina for 21 days, then remove for  Seven days and replace with new ring     famotidine (PEPCID) 20 MG tablet Take 20 mg by mouth 2 (two) times daily.     fluticasone (FLONASE) 50 MCG/ACT nasal spray SHAKE LQ AND U 2 SPRAYS IEN D     fluticasone (FLOVENT HFA) 110 MCG/ACT inhaler Inhale 2 puffs into the lungs 2 (two) times daily. 1 Inhaler 5   folic acid (FOLVITE) 174 MCG tablet Take 400 mcg by mouth daily.     furosemide (LASIX) 40 MG tablet TK 1 T PO  every other day  1   hydrOXYzine (ATARAX/VISTARIL) 25 MG tablet Take 1 tablet (25 mg total) by mouth at bedtime. 34 tablet 5   loratadine (CLARITIN) 10 MG tablet TK 1 T PO D PRN     montelukast (SINGULAIR) 10 MG tablet TAKE 1 TABLET BY MOUTH DAILY TO PREVENT COUGH OR WHEEZING 300 tablet 0   ondansetron (ZOFRAN-ODT) 8 MG disintegrating tablet DIS 1 T ON THE TONGUE Q 8 H PRN  2   promethazine (PHENERGAN) 50 MG tablet TK 1 T PO BID     thyroid  (ARMOUR) 120 MG tablet Take 1 tablet by mouth daily.     thyroid (ARMOUR) 15 MG tablet Take 1 tablet by mouth daily.     traMADol (ULTRAM) 50 MG tablet TAKE ONE TO TWO TABLETS BY MOUTH FOUR TIMES DAILY AS NEEDED     Vitamin D, Ergocalciferol, (DRISDOL) 50000 units CAPS capsule Take 50,000 Units by mouth every 7 (seven) days.      No current facility-administered medications for this visit.     REVIEW OF SYSTEMS:   Constitutional: Denies fevers, chills or night sweats Eyes: Denies blurriness of vision Ears, nose, mouth, throat, and face: Denies mucositis or sore throat Respiratory: Denies cough, dyspnea or wheezes Cardiovascular: Denies palpitation, chest discomfort or lower extremity swelling Gastrointestinal:  Denies nausea, heartburn or change in bowel habits Skin: Denies abnormal skin rashes Lymphatics: Denies new lymphadenopathy or easy bruising Neurological:Denies numbness, tingling or new weaknesses Behavioral/Psych: Mood is stable, no new changes  All other systems were reviewed with the patient and are negative.  PHYSICAL EXAMINATION: ECOG PERFORMANCE STATUS: 1 - Symptomatic but completely ambulatory  Vitals:   02/27/21 0853  BP: 138/87  Pulse: 80  Resp: 18  Temp: 97.7 F (36.5 C)  SpO2: 100%   Filed Weights   02/27/21 0853  Weight: 241 lb 3.2 oz (109.4 kg)    GENERAL:alert, no distress and comfortable NEURO: alert & oriented x 3 with fluent speech, no focal motor/sensory deficits  LABORATORY DATA:  I have reviewed the data as listed     Component Value Date/Time   NA 139 02/13/2015 1815   K 3.7 02/13/2015 1815   CL 104 02/13/2015 1815   CO2 28 02/13/2015 1815   GLUCOSE 84 02/13/2015 1815   BUN 12 02/13/2015 1815   CREATININE 0.92 02/13/2015 1815   CALCIUM 9.2 02/13/2015 1815   PROT 7.6 11/13/2014 0720   ALBUMIN 3.5 11/13/2014 0720   AST 21 11/13/2014 0720   ALT 12 (L) 11/13/2014 0720   ALKPHOS 54 11/13/2014 0720   BILITOT 0.1 (L) 11/13/2014 0720    GFRNONAA >60 02/13/2015 1815   GFRAA >60 02/13/2015 1815    No results found for: SPEP, UPEP  Lab Results  Component Value Date   WBC 5.0 02/27/2021   NEUTROABS 2.6 02/27/2021   HGB 10.1 (L) 02/27/2021   HCT 31.4 (L) 02/27/2021   MCV 86.3 02/27/2021   PLT 416 (H) 02/27/2021      Chemistry      Component Value Date/Time   NA 139 02/13/2015 1815   K 3.7 02/13/2015 1815   CL 104 02/13/2015 1815   CO2 28 02/13/2015 1815   BUN 12 02/13/2015 1815   CREATININE 0.92 02/13/2015 1815      Component Value Date/Time   CALCIUM 9.2 02/13/2015 1815   ALKPHOS 54 11/13/2014 0720   AST 21 11/13/2014 0720   ALT 12 (L) 11/13/2014 0720   BILITOT 0.1 (L) 11/13/2014 0720

## 2021-02-27 NOTE — Assessment & Plan Note (Signed)
She has severe, recurrent anemia since her last visit Previously, I have referred her to Dr. Henrene Pastor from GI for repeat endoscopy but the patient told me she has not been contacted We will figure that out  The most likely cause of her anemia is due to chronic blood loss/malabsorption syndrome. We discussed some of the risks, benefits, and alternatives of intravenous iron infusions. The patient is symptomatic from anemia and the iron level is critically low. She tolerated oral iron supplement poorly and desires to achieved higher levels of iron faster for adequate hematopoesis. Some of the side-effects to be expected including risks of infusion reactions, phlebitis, headaches, nausea and fatigue.  The patient is willing to proceed. Patient education material was dispensed.  Goal is to keep ferritin level greater than 50 and resolution of anemia Recommend we proceed with IV iron today as scheduled and I recommend at least 2 more doses of IV iron given the severity of her anemia I will see her again in 3 months for further follow-up

## 2021-02-27 NOTE — Assessment & Plan Note (Signed)
She is scheduled for [facility] on October 4 Will defer to gynecologist for management

## 2021-02-27 NOTE — Patient Instructions (Signed)
Ferumoxytol Injection What is this medication? FERUMOXYTOL (FER ue MOX i tol) treats low levels of iron in your body (iron deficiency anemia). Iron is a mineral that plays an important role in making red blood cells, which carry oxygen from your lungs to the rest of your body. This medicine may be used for other purposes; ask your health care provider or pharmacist if you have questions. COMMON BRAND NAME(S): Feraheme What should I tell my care team before I take this medication? They need to know if you have any of these conditions: Anemia not caused by low iron levels High levels of iron in the blood Magnetic resonance imaging (MRI) test scheduled An unusual or allergic reaction to iron, other medications, foods, dyes, or preservatives Pregnant or trying to get pregnant Breast-feeding How should I use this medication? This medication is for injection into a vein. It is given in a hospital or clinic setting. Talk to your care team the use of this medication in children. Special care may be needed. Overdosage: If you think you have taken too much of this medicine contact a poison control center or emergency room at once. NOTE: This medicine is only for you. Do not share this medicine with others. What if I miss a dose? It is important not to miss your dose. Call your care team if you are unable to keep an appointment. What may interact with this medication? Other iron products This list may not describe all possible interactions. Give your health care provider a list of all the medicines, herbs, non-prescription drugs, or dietary supplements you use. Also tell them if you smoke, drink alcohol, or use illegal drugs. Some items may interact with your medicine. What should I watch for while using this medication? Visit your care team regularly. Tell your care team if your symptoms do not start to get better or if they get worse. You may need blood work done while you are taking this  medication. You may need to follow a special diet. Talk to your care team. Foods that contain iron include: whole grains/cereals, dried fruits, beans, or peas, leafy green vegetables, and organ meats (liver, kidney). What side effects may I notice from receiving this medication? Side effects that you should report to your care team as soon as possible: Allergic reactions-skin rash, itching, hives, swelling of the face, lips, tongue, or throat Low blood pressure-dizziness, feeling faint or lightheaded, blurry vision Shortness of breath Side effects that usually do not require medical attention (report to your care team if they continue or are bothersome): Flushing Headache Joint pain Muscle pain Nausea Pain, redness, or irritation at injection site This list may not describe all possible side effects. Call your doctor for medical advice about side effects. You may report side effects to FDA at 1-800-FDA-1088. Where should I keep my medication? This medication is given in a hospital or clinic and will not be stored at home. NOTE: This sheet is a summary. It may not cover all possible information. If you have questions about this medicine, talk to your doctor, pharmacist, or health care provider.  2022 Elsevier/Gold Standard (2020-10-10 15:35:12)  

## 2021-03-05 ENCOUNTER — Telehealth: Payer: Self-pay | Admitting: Hematology and Oncology

## 2021-03-05 NOTE — Telephone Encounter (Signed)
Patient is unable to come in 09/30, unable to move appointments.

## 2021-03-06 ENCOUNTER — Ambulatory Visit: Payer: Medicaid Other

## 2021-03-06 MED FILL — Ferumoxytol Inj 510 MG/17ML (30 MG/ML) (Elemental Fe): INTRAVENOUS | Qty: 17 | Status: AC

## 2021-03-07 ENCOUNTER — Inpatient Hospital Stay: Payer: Medicaid Other | Attending: Hematology and Oncology

## 2021-03-07 ENCOUNTER — Other Ambulatory Visit: Payer: Self-pay

## 2021-03-07 ENCOUNTER — Inpatient Hospital Stay: Payer: Medicaid Other

## 2021-03-07 VITALS — BP 137/89 | HR 64 | Temp 97.7°F | Resp 16

## 2021-03-07 DIAGNOSIS — D500 Iron deficiency anemia secondary to blood loss (chronic): Secondary | ICD-10-CM | POA: Diagnosis not present

## 2021-03-07 DIAGNOSIS — D509 Iron deficiency anemia, unspecified: Secondary | ICD-10-CM

## 2021-03-07 DIAGNOSIS — N920 Excessive and frequent menstruation with regular cycle: Secondary | ICD-10-CM | POA: Diagnosis not present

## 2021-03-07 MED ORDER — SODIUM CHLORIDE 0.9 % IV SOLN
510.0000 mg | Freq: Once | INTRAVENOUS | Status: AC
  Administered 2021-03-07: 510 mg via INTRAVENOUS
  Filled 2021-03-07: qty 510

## 2021-03-07 MED ORDER — SODIUM CHLORIDE 0.9 % IV SOLN: INTRAVENOUS | Status: DC

## 2021-03-13 MED ORDER — SODIUM CHLORIDE 0.9 % IV SOLN
510.0000 mg | Freq: Once | INTRAVENOUS | Status: AC
  Administered 2021-03-14: 510 mg via INTRAVENOUS
  Filled 2021-03-13: qty 510

## 2021-03-14 ENCOUNTER — Inpatient Hospital Stay: Payer: Medicaid Other

## 2021-03-14 ENCOUNTER — Other Ambulatory Visit: Payer: Self-pay

## 2021-03-14 VITALS — BP 157/84 | HR 62 | Temp 97.5°F | Resp 20

## 2021-03-14 DIAGNOSIS — D509 Iron deficiency anemia, unspecified: Secondary | ICD-10-CM

## 2021-03-14 DIAGNOSIS — D500 Iron deficiency anemia secondary to blood loss (chronic): Secondary | ICD-10-CM | POA: Diagnosis not present

## 2021-03-14 MED ORDER — SODIUM CHLORIDE 0.9 % IV SOLN: INTRAVENOUS | Status: DC

## 2021-03-14 MED ORDER — SODIUM CHLORIDE 0.9 % IV SOLN: 510.0000 mg | Freq: Once | INTRAVENOUS | Status: DC

## 2021-03-14 NOTE — Progress Notes (Signed)
1105-patient did not want to stay her whole wait time.   Iron infusion given per orders. Patient tolerated it well without problems. Vitals stable and discharged home from clinic ambulatory. Follow up as scheduled.

## 2021-03-31 ENCOUNTER — Ambulatory Visit: Payer: Medicaid Other | Admitting: Internal Medicine

## 2021-04-06 ENCOUNTER — Telehealth: Payer: Self-pay

## 2021-04-06 ENCOUNTER — Encounter: Payer: Self-pay | Admitting: Hematology and Oncology

## 2021-04-06 NOTE — Telephone Encounter (Signed)
Called regarding GI referral. Office received notification that she canceled appt and did not call back to reschedule. She said that she called and left her phone # and no one has called her back. Provided phone # for [facility] to call them back to schedule.

## 2021-05-08 ENCOUNTER — Encounter: Payer: Self-pay | Admitting: Hematology and Oncology

## 2021-05-08 ENCOUNTER — Inpatient Hospital Stay: Payer: Medicaid Other

## 2021-05-08 ENCOUNTER — Inpatient Hospital Stay: Payer: Medicaid Other | Attending: Hematology and Oncology

## 2021-05-08 ENCOUNTER — Other Ambulatory Visit: Payer: Self-pay

## 2021-05-08 ENCOUNTER — Inpatient Hospital Stay (HOSPITAL_BASED_OUTPATIENT_CLINIC_OR_DEPARTMENT_OTHER): Payer: Medicaid Other | Admitting: Hematology and Oncology

## 2021-05-08 VITALS — BP 138/78 | HR 78 | Temp 98.2°F | Resp 18

## 2021-05-08 DIAGNOSIS — D500 Iron deficiency anemia secondary to blood loss (chronic): Secondary | ICD-10-CM | POA: Diagnosis present

## 2021-05-08 DIAGNOSIS — D509 Iron deficiency anemia, unspecified: Secondary | ICD-10-CM | POA: Diagnosis not present

## 2021-05-08 DIAGNOSIS — N83209 Unspecified ovarian cyst, unspecified side: Secondary | ICD-10-CM | POA: Diagnosis not present

## 2021-05-08 DIAGNOSIS — N920 Excessive and frequent menstruation with regular cycle: Secondary | ICD-10-CM | POA: Diagnosis not present

## 2021-05-08 LAB — CBC WITH DIFFERENTIAL/PLATELET
Abs Immature Granulocytes: 0.02 K/uL (ref 0.00–0.07)
Basophils Absolute: 0 K/uL (ref 0.0–0.1)
Basophils Relative: 1 %
Eosinophils Absolute: 0.2 K/uL (ref 0.0–0.5)
Eosinophils Relative: 3 %
HCT: 33.2 % — ABNORMAL LOW (ref 36.0–46.0)
Hemoglobin: 11.2 g/dL — ABNORMAL LOW (ref 12.0–15.0)
Immature Granulocytes: 0 %
Lymphocytes Relative: 30 %
Lymphs Abs: 1.6 K/uL (ref 0.7–4.0)
MCH: 30 pg (ref 26.0–34.0)
MCHC: 33.7 g/dL (ref 30.0–36.0)
MCV: 89 fL (ref 80.0–100.0)
Monocytes Absolute: 0.4 K/uL (ref 0.1–1.0)
Monocytes Relative: 7 %
Neutro Abs: 3.2 K/uL (ref 1.7–7.7)
Neutrophils Relative %: 59 %
Platelets: 270 K/uL (ref 150–400)
RBC: 3.73 MIL/uL — ABNORMAL LOW (ref 3.87–5.11)
RDW: 16.1 % — ABNORMAL HIGH (ref 11.5–15.5)
WBC: 5.4 K/uL (ref 4.0–10.5)
nRBC: 0 % (ref 0.0–0.2)

## 2021-05-08 LAB — IRON AND TIBC
Iron: 35 ug/dL — ABNORMAL LOW (ref 41–142)
Saturation Ratios: 13 % — ABNORMAL LOW (ref 21–57)
TIBC: 261 ug/dL (ref 236–444)
UIBC: 226 ug/dL (ref 120–384)

## 2021-05-08 LAB — FERRITIN: Ferritin: 74 ng/mL (ref 11–307)

## 2021-05-08 MED ORDER — SODIUM CHLORIDE 0.9 % IV SOLN
510.0000 mg | Freq: Once | INTRAVENOUS | Status: AC
  Administered 2021-05-08: 510 mg via INTRAVENOUS
  Filled 2021-05-08: qty 17

## 2021-05-08 NOTE — Assessment & Plan Note (Signed)
She is responding well to frequent IV iron infusion She is scheduled for complete hysterectomy in 2 weeks We will proceed with IV iron today to improve her blood count prior to surgery The most likely cause of her anemia is due to chronic blood loss/malabsorption syndrome. We discussed some of the risks, benefits, and alternatives of intravenous iron infusions. The patient is symptomatic from anemia and the iron level is critically low. She tolerated oral iron supplement poorly and desires to achieved higher levels of iron faster for adequate hematopoesis. Some of the side-effects to be expected including risks of infusion reactions, phlebitis, headaches, nausea and fatigue.  The patient is willing to proceed. Patient education material was dispensed.  Goal is to keep ferritin level greater than 50 and resolution of anemia I will see her again next month for further follow-up I am hopeful, after hysterectomy, she would not need IV iron in the future

## 2021-05-08 NOTE — Patient Instructions (Signed)

## 2021-05-08 NOTE — Progress Notes (Signed)
Patient waited 30 minutes post observation after iron infusion. Vss

## 2021-05-08 NOTE — Progress Notes (Signed)
[place] OFFICE PROGRESS NOTE  Patient, No Pcp Per (Inactive)  ASSESSMENT & PLAN:  Iron deficiency anemia She is responding well to frequent IV iron infusion She is scheduled for complete hysterectomy in 2 weeks We will proceed with IV iron today to improve her blood count prior to surgery The most likely cause of her anemia is due to chronic blood loss/malabsorption syndrome. We discussed some of the risks, benefits, and alternatives of intravenous iron infusions. The patient is symptomatic from anemia and the iron level is critically low. She tolerated oral iron supplement poorly and desires to achieved higher levels of iron faster for adequate hematopoesis. Some of the side-effects to be expected including risks of infusion reactions, phlebitis, headaches, nausea and fatigue.  The patient is willing to proceed. Patient education material was dispensed.  Goal is to keep ferritin level greater than 50 and resolution of anemia I will see her again next month for further follow-up I am hopeful, after hysterectomy, she would not need IV iron in the future   No orders of the defined types were placed in this encounter.   The total time spent in the appointment was 20 minutes encounter with patients including review of chart and various tests results, discussions about plan of care and coordination of care plan   All questions were answered. The patient knows to call the clinic with any problems, questions or concerns. No barriers to learning was detected.    Heath Lark, MD 12/2/202210:45 AM  INTERVAL HISTORY: Nicole Nicholson 40 y.o. Nicholson returns for recurrent iron deficiency anemia secondary to menorrhagia She is scheduled for hysterectomy in 2 weeks She tolerated IV iron well She denies other forms of bleeding  SUMMARY OF HEMATOLOGIC HISTORY:  Nicole Nicholson is seen here for severe symptomatic anemia and severe fatigue  She was found to have abnormal CBC from  recent ER/urgent care visit. She has "anemia all her life". She had multiple pregnancies in the past and was told she was anemic She denies recent chest pain on exertion, but does have shortness of breath on minimal exertion, pre-syncopal episodes, or palpitations. She had not noticed any recent bleeding such as epistaxis, hematuria or hematochezia The patient has occasional over the counter NSAID ingestion. She is not on antiplatelets agents.  She had no prior history or diagnosis of cancer. Her age appropriate screening programs are up-to-date. She complained of pica but eats a variety of diet. She never donated blood or received blood transfusion The patient was prescribed oral iron supplements and she takes 1 daily but tolerated that poorly. She has excessive menorrhagia monthly and was told she has fibroids/ovarian cysts. She had thyroid surgery in the past and complained of chronic fatigue She received multiple doses of IV iron in 2016 & 2017 and March 2018 with improvement of energy level She had EGD and colonoscopy in 2018 which showed no evidence of bleeding lesions She has evaluation and endometrial biopsy in 2019 that came back benign In 2022, she underwent endometrial polyp removal and ablation  I have reviewed the past medical history, past surgical history, social history and family history with the patient and they are unchanged from previous note.  ALLERGIES:  is allergic to codeine, darvocet [propoxyphene n-acetaminophen], percocet [oxycodone-acetaminophen], tylenol [acetaminophen], and mirabegron.  MEDICATIONS:  Current Outpatient Medications  Medication Sig Dispense Refill   albuterol (PROVENTIL HFA;VENTOLIN HFA) 108 (90 Base) MCG/ACT inhaler INL 2 PFS PO QID PRN     ALPRAZolam (XANAX) 0.5  MG tablet TK 1 T PO TWO TIMES DAILY AS NEED     cetirizine (ZYRTEC) 10 MG tablet Take 1 tablet (10 mg total) by mouth every morning. (Patient taking differently: Take 10 mg by mouth  daily as needed.) 34 tablet 5   clindamycin (CLINDAGEL) 1 % gel APP EXT AA BID     clotrimazole-betamethasone (LOTRISONE) cream APP EXT AA BID     cyclobenzaprine (FLEXERIL) 10 MG tablet as needed.      diclofenac (VOLTAREN) 50 MG EC tablet TK 1 T PO TID PRN  1   diltiazem 2 % GEL Apply 1 application topically 2 (two) times daily. 30 g 1   EPINEPHrine (EPIPEN 2-PAK) 0.3 mg/0.3 mL IJ SOAJ injection Use as directed for severe allergic reactions 2 Device 2   etonogestrel-ethinyl estradiol (NUVARING) 0.12-0.015 MG/24HR vaginal ring Insert into vagina for 21 days, then remove for  Seven days and replace with new ring     famotidine (PEPCID) 20 MG tablet Take 20 mg by mouth 2 (two) times daily.     fluticasone (FLONASE) 50 MCG/ACT nasal spray SHAKE LQ AND U 2 SPRAYS IEN D     fluticasone (FLOVENT HFA) 110 MCG/ACT inhaler Inhale 2 puffs into the lungs 2 (two) times daily. 1 Inhaler 5   folic acid (FOLVITE) 737 MCG tablet Take 400 mcg by mouth daily.     furosemide (LASIX) 40 MG tablet TK 1 T PO  every other day  1   hydrOXYzine (ATARAX/VISTARIL) 25 MG tablet Take 1 tablet (25 mg total) by mouth at bedtime. 34 tablet 5   loratadine (CLARITIN) 10 MG tablet TK 1 T PO D PRN     montelukast (SINGULAIR) 10 MG tablet TAKE 1 TABLET BY MOUTH DAILY TO PREVENT COUGH OR WHEEZING 300 tablet 0   ondansetron (ZOFRAN-ODT) 8 MG disintegrating tablet DIS 1 T ON THE TONGUE Q 8 H PRN  2   promethazine (PHENERGAN) 50 MG tablet TK 1 T PO BID     thyroid (ARMOUR) 120 MG tablet Take 1 tablet by mouth daily.     thyroid (ARMOUR) 15 MG tablet Take 1 tablet by mouth daily.     traMADol (ULTRAM) 50 MG tablet TAKE ONE TO TWO TABLETS BY MOUTH FOUR TIMES DAILY AS NEEDED     Vitamin D, Ergocalciferol, (DRISDOL) 50000 units CAPS capsule Take 50,000 Units by mouth every 7 (seven) days.      No current facility-administered medications for this visit.   Facility-Administered Medications Ordered in Other Visits  Medication Dose  Route Frequency Provider Last Rate Last Admin   ferumoxytol (FERAHEME) 510 mg in sodium chloride 0.9 % 100 mL IVPB  510 mg Intravenous Once Heath Lark, MD 468 mL/hr at 05/08/21 1032 510 mg at 05/08/21 1032     REVIEW OF SYSTEMS:   Constitutional: Denies fevers, chills or night sweats Eyes: Denies blurriness of vision Ears, nose, mouth, throat, and face: Denies mucositis or sore throat Respiratory: Denies cough, dyspnea or wheezes Cardiovascular: Denies palpitation, chest discomfort or lower extremity swelling Gastrointestinal:  Denies nausea, heartburn or change in bowel habits Skin: Denies abnormal skin rashes Lymphatics: Denies new lymphadenopathy or easy bruising Neurological:Denies numbness, tingling or new weaknesses Behavioral/Psych: Mood is stable, no new changes  All other systems were reviewed with the patient and are negative.  PHYSICAL EXAMINATION: ECOG PERFORMANCE STATUS: 1 - Symptomatic but completely ambulatory  Vitals:   05/08/21 0954  BP: (!) 145/83  Pulse: 81  Resp: 18  Temp:  97.8 F (36.6 C)  SpO2: 100%   Filed Weights   05/08/21 0954  Weight: 243 lb 3.2 oz (110.3 kg)    GENERAL:alert, no distress and comfortable NEURO: alert & oriented x 3 with fluent speech, no focal motor/sensory deficits  LABORATORY DATA:  I have reviewed the data as listed     Component Value Date/Time   NA 139 02/13/2015 1815   K 3.7 02/13/2015 1815   CL 104 02/13/2015 1815   CO2 28 02/13/2015 1815   GLUCOSE 84 02/13/2015 1815   BUN 12 02/13/2015 1815   CREATININE 0.92 02/13/2015 1815   CALCIUM 9.2 02/13/2015 1815   PROT 7.6 11/13/2014 0720   ALBUMIN 3.5 11/13/2014 0720   AST 21 11/13/2014 0720   ALT 12 (L) 11/13/2014 0720   ALKPHOS 54 11/13/2014 0720   BILITOT 0.1 (L) 11/13/2014 0720   GFRNONAA >60 02/13/2015 1815   GFRAA >60 02/13/2015 1815    No results found for: SPEP, UPEP  Lab Results  Component Value Date   WBC 5.4 05/08/2021   NEUTROABS 3.2 05/08/2021    HGB 11.2 (L) 05/08/2021   HCT 33.2 (L) 05/08/2021   MCV 89.0 05/08/2021   PLT 270 05/08/2021      Chemistry      Component Value Date/Time   NA 139 02/13/2015 1815   K 3.7 02/13/2015 1815   CL 104 02/13/2015 1815   CO2 28 02/13/2015 1815   BUN 12 02/13/2015 1815   CREATININE 0.92 02/13/2015 1815      Component Value Date/Time   CALCIUM 9.2 02/13/2015 1815   ALKPHOS 54 11/13/2014 0720   AST 21 11/13/2014 0720   ALT 12 (L) 11/13/2014 0720   BILITOT 0.1 (L) 11/13/2014 0720

## 2021-05-13 ENCOUNTER — Encounter: Payer: Self-pay | Admitting: Internal Medicine

## 2021-05-13 ENCOUNTER — Ambulatory Visit (INDEPENDENT_AMBULATORY_CARE_PROVIDER_SITE_OTHER): Payer: Medicaid Other | Admitting: Internal Medicine

## 2021-05-13 VITALS — BP 120/70 | HR 77 | Ht 67.0 in | Wt 247.0 lb

## 2021-05-13 DIAGNOSIS — R140 Abdominal distension (gaseous): Secondary | ICD-10-CM

## 2021-05-13 DIAGNOSIS — D509 Iron deficiency anemia, unspecified: Secondary | ICD-10-CM

## 2021-05-13 DIAGNOSIS — D508 Other iron deficiency anemias: Secondary | ICD-10-CM | POA: Diagnosis not present

## 2021-05-13 DIAGNOSIS — K5901 Slow transit constipation: Secondary | ICD-10-CM

## 2021-05-13 DIAGNOSIS — K5900 Constipation, unspecified: Secondary | ICD-10-CM | POA: Diagnosis not present

## 2021-05-13 NOTE — Progress Notes (Signed)
HISTORY OF PRESENT ILLNESS:  Nicole Nicholson is a 40 y.o. female who was sent today by hematology regarding recurrent iron deficiency anemia.  The patient was last seen in this office December 2021 regarding the same and rectal bleeding.  She was evaluated in January 2018 regarding iron deficiency anemia, constipation, and intermittent solid food dysphagia.  She has had chronic iron deficiency anemia.  She has a history of heavy menstrual periods.  She did undergo a GI evaluation in 2018 to rule out GI cause for iron deficiency anemia.  This included complete colonoscopy and upper endoscopy November 17, 2016.  The terminal ileum was normal.  The colon was normal except for hemorrhoids and melanosis.  A diminutive polyp was removed and found to be hyperplastic.  Upper endoscopy was normal.  With adequate iron replacement the patient's blood counts normalized.  However she did have recurrent iron deficiency anemia on several occasions.  She continues to menstruate.  She was demonstrated previously to have anal fissure.  She is anticipating hysterectomy for uterine fibroids and heavy menstrual flow.  Her GI review of systems is remarkable for increased intestinal gas.  She asks about Gas-X.  She also reports constipation.  Moves her bowels once or twice per week.  This is a chronic and unchanged problem.  No GI bleeding.  Blood work from May 08, 2021 shows hemoglobin 11.2, MCV 89.0, ferritin 74, iron saturation 13%.  REVIEW OF SYSTEMS:  All non-GI ROS negative.  Past Medical History:  Diagnosis Date   Allergy    Anxiety    Constipation    occasional not chronic per pt.   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 She has been smoking an average of .5 packs per day. She has never used smokeless tobacco. She reports current alcohol use. She reports that she does not use drugs.  family history includes Allergic rhinitis in her daughter; Aneurysm in her maternal aunt; Asthma in her daughter; Bronchitis in her daughter; Diabetes in her paternal grandmother; Eczema in her daughter; Food Allergy in her daughter; Heart disease in her maternal grandmother; Pancreatic cancer in her maternal grandfather; Stroke in her maternal aunt; Thyroid disease in her maternal grandfather; Urticaria in her daughter.  Allergies  Allergen Reactions   Codeine Anaphylaxis    Allergic to TYLENOL # 3   Darvocet [Propoxyphene N-Acetaminophen] Anaphylaxis   Percocet [Oxycodone-Acetaminophen] Anaphylaxis   Tylenol [Acetaminophen] Anaphylaxis    Allergic to TYLENOL # 3   Mirabegron Itching       PHYSICAL EXAMINATION: Vital signs: Ht 5\' 7"  (1.702 m)   Wt 247 lb (112 kg)   BMI 38.69 kg/m   Constitutional: generally well-appearing, no acute distress Psychiatric: alert and oriented x3, cooperative Eyes: extraocular movements intact, anicteric, conjunctiva pink Mouth: Mask Neck: supple no lymphadenopathy Cardiovascular: heart regular rate and rhythm, no murmur Lungs: clear to auscultation bilaterally Abdomen: soft,, obese nontender, nondistended, no obvious ascites, no peritoneal signs, normal bowel sounds, no organomegaly  Rectal: Omitted Extremities: no clubbing, cyanosis, or lower extremity edema bilaterally Skin: no lesions on visible extremities Neuro: No focal deficits.  Cranial nerves intact  ASSESSMENT:  1.  Chronic recurrent iron deficiency anemia secondary to chronic, and excessive at times, menstrual blood loss.  Anticipating hysterectomy 2.  Negative GI work-up 2018 including  complete colonoscopy with ileoscopy and upper endoscopy.  No indication for repeating GI work-up at this time given negative previous work-up and obvious explanation for chronic blood loss (GYN). 3.  Chronic constipation.  Stable 4.  Bloating   PLAN:  1.  MiraLAX for constipation.  Titrate to need.  Discussed this in detail. 2.  Gas-X as needed 3.  Keep plans for hysterectomy with GYN 4.  Resume hematologic care with Dr. Alvy Bimler to monitor blood counts and iron levels after fully recovering from hysterectomy 5.  Will need routine repeat screening colonoscopy around 2028.  Patient aware

## 2021-05-13 NOTE — Patient Instructions (Signed)
If you are age 42 or younger, your body mass index should be between 19-25. Your Body mass index is 38.69 kg/m. If this is out of the aformentioned range listed, please consider follow up with your Primary Care Provider.   ________________________________________________________  The [facility] providers would like to encourage you to use [facility] to communicate with providers for non-urgent requests or questions.  Due to long hold times on the telephone, sending your provider a message by [facility] may be a faster and more efficient way to get a response.  Please allow 48 business hours for a response.  Please remember that this is for non-urgent requests.  _______________________________________________________  Please purchase the following medications over the counter and take as directed:  START: Miralax daily.  You can adjust as needed to achieve regular bowel movements.   You will follow up in our office on an as needed basis.  Thank you for entrusting me with your care and choosing [facility].  Dr Henrene Pastor

## 2021-06-25 ENCOUNTER — Other Ambulatory Visit: Payer: Self-pay

## 2021-06-25 DIAGNOSIS — D509 Iron deficiency anemia, unspecified: Secondary | ICD-10-CM

## 2021-06-26 ENCOUNTER — Encounter: Payer: Self-pay | Admitting: Hematology and Oncology

## 2021-06-26 ENCOUNTER — Ambulatory Visit: Payer: Medicaid Other

## 2021-06-26 ENCOUNTER — Ambulatory Visit: Payer: Medicaid Other | Admitting: Hematology and Oncology

## 2021-06-26 ENCOUNTER — Telehealth: Payer: Self-pay | Admitting: Hematology and Oncology

## 2021-06-26 ENCOUNTER — Other Ambulatory Visit: Payer: Medicaid Other

## 2021-06-26 NOTE — Telephone Encounter (Signed)
Scheduled appointment per patients request; called to update patient on updated appointment. Patient is aware.

## 2021-06-29 ENCOUNTER — Inpatient Hospital Stay: Payer: Medicaid Other | Attending: Hematology and Oncology

## 2021-06-29 ENCOUNTER — Encounter: Payer: Self-pay | Admitting: Hematology and Oncology

## 2021-06-29 ENCOUNTER — Inpatient Hospital Stay: Payer: Medicaid Other

## 2021-06-29 ENCOUNTER — Inpatient Hospital Stay (HOSPITAL_BASED_OUTPATIENT_CLINIC_OR_DEPARTMENT_OTHER): Payer: Medicaid Other | Admitting: Hematology and Oncology

## 2021-06-29 ENCOUNTER — Other Ambulatory Visit: Payer: Self-pay

## 2021-06-29 DIAGNOSIS — D509 Iron deficiency anemia, unspecified: Secondary | ICD-10-CM | POA: Diagnosis present

## 2021-06-29 DIAGNOSIS — Z90710 Acquired absence of both cervix and uterus: Secondary | ICD-10-CM | POA: Insufficient documentation

## 2021-06-29 DIAGNOSIS — Z79899 Other long term (current) drug therapy: Secondary | ICD-10-CM | POA: Insufficient documentation

## 2021-06-29 LAB — CBC WITH DIFFERENTIAL/PLATELET
Abs Immature Granulocytes: 0.01 10*3/uL (ref 0.00–0.07)
Basophils Absolute: 0 10*3/uL (ref 0.0–0.1)
Basophils Relative: 1 %
Eosinophils Absolute: 0.1 10*3/uL (ref 0.0–0.5)
Eosinophils Relative: 1 %
HCT: 41.4 % (ref 36.0–46.0)
Hemoglobin: 13.7 g/dL (ref 12.0–15.0)
Immature Granulocytes: 0 %
Lymphocytes Relative: 28 %
Lymphs Abs: 1.6 10*3/uL (ref 0.7–4.0)
MCH: 29.9 pg (ref 26.0–34.0)
MCHC: 33.1 g/dL (ref 30.0–36.0)
MCV: 90.4 fL (ref 80.0–100.0)
Monocytes Absolute: 0.5 10*3/uL (ref 0.1–1.0)
Monocytes Relative: 9 %
Neutro Abs: 3.4 10*3/uL (ref 1.7–7.7)
Neutrophils Relative %: 61 %
Platelets: 297 10*3/uL (ref 150–400)
RBC: 4.58 MIL/uL (ref 3.87–5.11)
RDW: 13.4 % (ref 11.5–15.5)
WBC: 5.7 10*3/uL (ref 4.0–10.5)
nRBC: 0 % (ref 0.0–0.2)

## 2021-06-29 LAB — IRON AND IRON BINDING CAPACITY (CC-WL,HP ONLY)
Iron: 70 ug/dL (ref 28–170)
Saturation Ratios: 22 % (ref 10.4–31.8)
TIBC: 316 ug/dL (ref 250–450)
UIBC: 246 ug/dL (ref 148–442)

## 2021-06-29 LAB — FERRITIN: Ferritin: 104 ng/mL (ref 11–307)

## 2021-06-29 NOTE — Progress Notes (Signed)
[facility] OFFICE PROGRESS NOTE  Patient, No Pcp Per (Inactive)  ASSESSMENT & PLAN:  Iron deficiency anemia This is resolved since her hysterectomy Repeat CBC show no evidence of anemia and she is no longer iron deficient She does not need long-term follow-up If the patient have recurrent iron deficiency anemia, I recommend GI consult to evaluate for GI source of bleeding She expressed verbal understanding  No orders of the defined types were placed in this encounter.   The total time spent in the appointment was 20 minutes encounter with patients including review of chart and various tests results, discussions about plan of care and coordination of care plan   All questions were answered. The patient knows to call the clinic with any problems, questions or concerns. No barriers to learning was detected.    Nicole Lark, MD 1/23/202312:46 PM  INTERVAL HISTORY: Nicole Nicholson 41 y.o. female returns for further follow-up for chronic iron deficiency anemia secondary to menorrhagia She has successful hysterectomy last month Her postoperative course was complicated by brief episode of vaginal bleeding that has since resolved Her energy level is fair The patient denies any recent signs or symptoms of bleeding such as spontaneous epistaxis, hematuria or hematochezia.  SUMMARY OF HEMATOLOGIC HISTORY:  Nicole Nicholson is seen here for severe symptomatic anemia and severe fatigue  She was found to have abnormal CBC from recent ER/urgent care visit. She has "anemia all her life". She had multiple pregnancies in the past and was told she was anemic She denies recent chest pain on exertion, but does have shortness of breath on minimal exertion, pre-syncopal episodes, or palpitations. She had not noticed any recent bleeding such as epistaxis, hematuria or hematochezia The patient has occasional over the counter NSAID ingestion. She is not on antiplatelets agents.  She had  no prior history or diagnosis of cancer. Her age appropriate screening programs are up-to-date. She complained of pica but eats a variety of diet. She never donated blood or received blood transfusion The patient was prescribed oral iron supplements and she takes 1 daily but tolerated that poorly. She has excessive menorrhagia monthly and was told she has fibroids/ovarian cysts. She had thyroid surgery in the past and complained of chronic fatigue She received multiple doses of IV iron in 2016 & 2017 and March 2018 with improvement of energy level She had EGD and colonoscopy in 2018 which showed no evidence of bleeding lesions She has evaluation and endometrial biopsy in 2019 that came back benign In 2022, she underwent endometrial polyp removal and ablation End of December, she had complete hysterectomy.  Pathology showed no evidence of malignancy  I have reviewed the past medical history, past surgical history, social history and family history with the patient and they are unchanged from previous note.  ALLERGIES:  is allergic to codeine, darvocet [propoxyphene n-acetaminophen], percocet [oxycodone-acetaminophen], tylenol [acetaminophen], and mirabegron.  MEDICATIONS:  Current Outpatient Medications  Medication Sig Dispense Refill   albuterol (PROVENTIL HFA;VENTOLIN HFA) 108 (90 Base) MCG/ACT inhaler INL 2 PFS PO QID PRN     ALPRAZolam (XANAX) 0.5 MG tablet TK 1 T PO TWO TIMES DAILY AS NEED     cetirizine (ZYRTEC) 10 MG tablet Take 1 tablet (10 mg total) by mouth every morning. (Patient taking differently: Take 10 mg by mouth daily as needed.) 34 tablet 5   clindamycin (CLINDAGEL) 1 % gel APP EXT AA BID     clotrimazole-betamethasone (LOTRISONE) cream APP EXT AA BID  cyclobenzaprine (FLEXERIL) 10 MG tablet as needed.      diclofenac (VOLTAREN) 50 MG EC tablet TK 1 T PO TID PRN  1   EPINEPHrine (EPIPEN 2-PAK) 0.3 mg/0.3 mL IJ SOAJ injection Use as directed for severe allergic reactions  2 Device 2   famotidine (PEPCID) 20 MG tablet Take 20 mg by mouth 2 (two) times daily.     fluticasone (FLONASE) 50 MCG/ACT nasal spray SHAKE LQ AND U 2 SPRAYS IEN D     fluticasone (FLOVENT HFA) 110 MCG/ACT inhaler Inhale 2 puffs into the lungs 2 (two) times daily. 1 Inhaler 5   folic acid (FOLVITE) 828 MCG tablet Take 400 mcg by mouth daily.     furosemide (LASIX) 40 MG tablet TK 1 T PO  every other day  1   hydrOXYzine (ATARAX/VISTARIL) 25 MG tablet Take 1 tablet (25 mg total) by mouth at bedtime. 34 tablet 5   loratadine (CLARITIN) 10 MG tablet TK 1 T PO D PRN     montelukast (SINGULAIR) 10 MG tablet TAKE 1 TABLET BY MOUTH DAILY TO PREVENT COUGH OR WHEEZING 300 tablet 0   ondansetron (ZOFRAN-ODT) 8 MG disintegrating tablet DIS 1 T ON THE TONGUE Q 8 H PRN  2   promethazine (PHENERGAN) 50 MG tablet TK 1 T PO BID     thyroid (ARMOUR) 120 MG tablet Take 1 tablet by mouth daily.     traMADol (ULTRAM) 50 MG tablet TAKE ONE TO TWO TABLETS BY MOUTH FOUR TIMES DAILY AS NEEDED     Vitamin D, Ergocalciferol, (DRISDOL) 50000 units CAPS capsule Take 50,000 Units by mouth every 7 (seven) days.      No current facility-administered medications for this visit.     REVIEW OF SYSTEMS:   Constitutional: Denies fevers, chills or night sweats Eyes: Denies blurriness of vision Ears, nose, mouth, throat, and face: Denies mucositis or sore throat Respiratory: Denies cough, dyspnea or wheezes Cardiovascular: Denies palpitation, chest discomfort or lower extremity swelling Gastrointestinal:  Denies nausea, heartburn or change in bowel habits Skin: Denies abnormal skin rashes Lymphatics: Denies new lymphadenopathy or easy bruising Neurological:Denies numbness, tingling or new weaknesses Behavioral/Psych: Mood is stable, no new changes  All other systems were reviewed with the patient and are negative.  PHYSICAL EXAMINATION: ECOG PERFORMANCE STATUS: 0 - Asymptomatic  Vitals:   06/29/21 1048  BP: (!)  157/98  Pulse: 70  Resp: 18   Filed Weights   06/29/21 1048  Weight: 246 lb 9.6 oz (111.9 kg)    GENERAL:alert, no distress and comfortable NEURO: alert & oriented x 3 with fluent speech, no focal motor/sensory deficits  LABORATORY DATA:  I have reviewed the data as listed     Component Value Date/Time   NA 139 02/13/2015 1815   K 3.7 02/13/2015 1815   CL 104 02/13/2015 1815   CO2 28 02/13/2015 1815   GLUCOSE 84 02/13/2015 1815   BUN 12 02/13/2015 1815   CREATININE 0.92 02/13/2015 1815   CALCIUM 9.2 02/13/2015 1815   PROT 7.6 11/13/2014 0720   ALBUMIN 3.5 11/13/2014 0720   AST 21 11/13/2014 0720   ALT 12 (L) 11/13/2014 0720   ALKPHOS 54 11/13/2014 0720   BILITOT 0.1 (L) 11/13/2014 0720   GFRNONAA >60 02/13/2015 1815   GFRAA >60 02/13/2015 1815    No results found for: SPEP, UPEP  Lab Results  Component Value Date   WBC 5.7 06/29/2021   NEUTROABS 3.4 06/29/2021   HGB 13.7 06/29/2021  HCT 41.4 06/29/2021   MCV 90.4 06/29/2021   PLT 297 06/29/2021      Chemistry      Component Value Date/Time   NA 139 02/13/2015 1815   K 3.7 02/13/2015 1815   CL 104 02/13/2015 1815   CO2 28 02/13/2015 1815   BUN 12 02/13/2015 1815   CREATININE 0.92 02/13/2015 1815      Component Value Date/Time   CALCIUM 9.2 02/13/2015 1815   ALKPHOS 54 11/13/2014 0720   AST 21 11/13/2014 0720   ALT 12 (L) 11/13/2014 0720   BILITOT 0.1 (L) 11/13/2014 0720

## 2021-06-29 NOTE — Assessment & Plan Note (Signed)
This is resolved since her hysterectomy Repeat CBC show no evidence of anemia and she is no longer iron deficient She does not need long-term follow-up If the patient have recurrent iron deficiency anemia, I recommend GI consult to evaluate for GI source of bleeding She expressed verbal understanding

## 2023-07-27 ENCOUNTER — Other Ambulatory Visit: Payer: Self-pay | Admitting: Internal Medicine

## 2023-07-27 DIAGNOSIS — Z1231 Encounter for screening mammogram for malignant neoplasm of breast: Secondary | ICD-10-CM

## 2023-11-29 ENCOUNTER — Other Ambulatory Visit: Payer: Self-pay

## 2023-11-29 ENCOUNTER — Emergency Department (HOSPITAL_BASED_OUTPATIENT_CLINIC_OR_DEPARTMENT_OTHER)
Admission: EM | Admit: 2023-11-29 | Discharge: 2023-11-29 | Disposition: A | Discharge status: Home / Self Care | Attending: Emergency Medicine | Admitting: Emergency Medicine

## 2023-11-29 ENCOUNTER — Emergency Department (HOSPITAL_BASED_OUTPATIENT_CLINIC_OR_DEPARTMENT_OTHER)

## 2023-11-29 ENCOUNTER — Encounter (HOSPITAL_BASED_OUTPATIENT_CLINIC_OR_DEPARTMENT_OTHER): Payer: Self-pay | Admitting: Emergency Medicine

## 2023-11-29 DIAGNOSIS — Z79899 Other long term (current) drug therapy: Secondary | ICD-10-CM | POA: Insufficient documentation

## 2023-11-29 DIAGNOSIS — M79671 Pain in right foot: Secondary | ICD-10-CM | POA: Diagnosis not present

## 2023-11-29 DIAGNOSIS — I10 Essential (primary) hypertension: Secondary | ICD-10-CM | POA: Diagnosis not present

## 2023-11-29 DIAGNOSIS — M79672 Pain in left foot: Secondary | ICD-10-CM | POA: Diagnosis not present

## 2023-11-29 DIAGNOSIS — R109 Unspecified abdominal pain: Secondary | ICD-10-CM | POA: Insufficient documentation

## 2023-11-29 DIAGNOSIS — R079 Chest pain, unspecified: Secondary | ICD-10-CM | POA: Diagnosis present

## 2023-11-29 DIAGNOSIS — R0789 Other chest pain: Secondary | ICD-10-CM | POA: Insufficient documentation

## 2023-11-29 DIAGNOSIS — E039 Hypothyroidism, unspecified: Secondary | ICD-10-CM | POA: Insufficient documentation

## 2023-11-29 LAB — COMPREHENSIVE METABOLIC PANEL WITH GFR
ALT: <5 U/L (ref 0–44)
AST: 11 U/L — ABNORMAL LOW (ref 15–41)
Albumin: 3.6 g/dL (ref 3.5–5.0)
Alkaline Phosphatase: 77 U/L (ref 38–126)
Anion gap: 10 (ref 5–15)
BUN: 9 mg/dL (ref 6–20)
CO2: 22 mmol/L (ref 22–32)
Calcium: 9.3 mg/dL (ref 8.9–10.3)
Chloride: 105 mmol/L (ref 98–111)
Creatinine, Ser: 0.81 mg/dL (ref 0.44–1.00)
GFR, Estimated: >60 mL/min (ref >60)
Glucose, Bld: 74 mg/dL (ref 70–99)
Potassium: 4.3 mmol/L (ref 3.5–5.1)
Sodium: 137 mmol/L (ref 135–145)
Total Bilirubin: 0.3 mg/dL (ref 0.0–1.2)
Total Protein: 7.5 g/dL (ref 6.5–8.1)

## 2023-11-29 LAB — CBC WITH DIFFERENTIAL/PLATELET
Abs Immature Granulocytes: 0.01 10*3/uL (ref 0.00–0.07)
Basophils Absolute: 0 10*3/uL (ref 0.0–0.1)
Basophils Relative: 1 %
Eosinophils Absolute: 0.1 10*3/uL (ref 0.0–0.5)
Eosinophils Relative: 1 %
HCT: 38.6 % (ref 36.0–46.0)
Hemoglobin: 12.9 g/dL (ref 12.0–15.0)
Immature Granulocytes: 0 %
Lymphocytes Relative: 32 %
Lymphs Abs: 1.9 10*3/uL (ref 0.7–4.0)
MCH: 29.4 pg (ref 26.0–34.0)
MCHC: 33.4 g/dL (ref 30.0–36.0)
MCV: 87.9 fL (ref 80.0–100.0)
Monocytes Absolute: 0.7 10*3/uL (ref 0.1–1.0)
Monocytes Relative: 12 %
Neutro Abs: 3.3 10*3/uL (ref 1.7–7.7)
Neutrophils Relative %: 54 %
Platelets: 312 10*3/uL (ref 150–400)
RBC: 4.39 MIL/uL (ref 3.87–5.11)
RDW: 13.8 % (ref 11.5–15.5)
WBC: 6 10*3/uL (ref 4.0–10.5)
nRBC: 0 % (ref 0.0–0.2)

## 2023-11-29 LAB — TROPONIN T, HIGH SENSITIVITY
Troponin T High Sensitivity: <15 ng/L (ref <19)
Troponin T High Sensitivity: <15 ng/L (ref <19)

## 2023-11-29 LAB — LIPASE, BLOOD: Lipase: 18 U/L (ref 11–51)

## 2023-11-29 LAB — D-DIMER, QUANTITATIVE: D-Dimer, Quant: 0.51 ug{FEU}/mL — ABNORMAL HIGH (ref 0.00–0.50)

## 2023-11-29 MED ORDER — IOHEXOL 350 MG/ML SOLN
100.0000 mL | Freq: Once | INTRAVENOUS | Status: AC | PRN
  Administered 2023-11-29: 100 mL via INTRAVENOUS

## 2023-11-29 NOTE — ED Provider Notes (Signed)
  ED Course / MDM   Clinical Course as of 11/29/23 1903  Tue Nov 29, 2023  1506 Received sign out from Dr. Doretha pending D-dimer. Presenting with right sided pain, foot pain. No abdominal tenderness. Labs otherwise reassuring.  [WS]  1902 Workup reassuring, negative ultrasound, negative CTA chest.  Troponin negative x 2. Will discharge patient to home. All questions answered. Patient comfortable with plan of discharge. Return precautions discussed with patient and specified on the after visit summary.  [WS]    Clinical Course User Index [WS] Francesca Elsie CROME, MD   Medical Decision Making Amount and/or Complexity of Data Reviewed Labs: ordered. Radiology: ordered.  Risk Prescription drug management.         Francesca Elsie CROME, MD 11/29/23 [phone]

## 2023-11-29 NOTE — ED Triage Notes (Signed)
 C/o R sided rib pain, headache, and bilateral feet swelling since Saturday

## 2023-11-29 NOTE — ED Provider Notes (Signed)
  EMERGENCY DEPARTMENT AT [facility] Provider Note   CSN: [id number] Arrival date & time: 11/29/23  1201     Patient presents with: Abdominal Pain   Nicole Nicholson is a 43 y.o. female.   Patient is a 43 year old female with a history of hypothyroidism currently on thyroid  replacement, pulmonary hypertension, hypertension and prior total hysterectomy on estrogen replacement for the last year as well as ongoing tobacco use who is presenting today with right sided chest pain.  She reports that that discomfort started 2 days ago but over the last few weeks she has been experiencing in addition significant pain in her feet bilaterally.  It hurts a little when she is not standing but the pain is much more severe when standing up.  It is an aching throbbing kind of pain.  She has not noticed significant new swelling in her legs but reports she always has some chronic swelling.  She continues to take her thyroid  medication but reports it was last changed in April because her numbers still have not normalized.  She has not had significant cough but always has congestion and is still taking her allergy medication.  She has not had any shortness of breath and taking a deep breath does not seem to make the pain any worse.  She is constantly nauseated and takes medication fairly frequently for that but does not feel that is any different than baseline.  She has had no vomiting, fever, change in her bowel movements or urinary complaints.  She denies any trauma.  The history is provided by the patient and medical records.  Abdominal Pain      Prior to Admission medications   Medication Sig Start Date End Date Taking? Authorizing Provider  fexofenadine (ALLEGRA) 180 MG tablet Take 180 mg by mouth daily. 10/31/19  Yes [provider]  albuterol (PROVENTIL HFA;VENTOLIN HFA) 108 (90 Base) MCG/ACT inhaler INL 2 PFS PO QID PRN 04/27/17   [provider]  ALPRAZolam (XANAX)  0.5 MG tablet TK 1 T PO TWO TIMES DAILY AS NEED 07/05/18   [provider]  cetirizine  (ZYRTEC ) 10 MG tablet Take 1 tablet (10 mg total) by mouth every morning. Patient taking differently: Take 10 mg by mouth daily as needed. 07/04/18   Asa Aloysius LABOR, MD  clindamycin (CLINDAGEL) 1 % gel APP EXT AA BID 07/15/18   [provider]  clotrimazole-betamethasone (LOTRISONE) cream APP EXT AA BID 06/23/18   [provider]  cyclobenzaprine (FLEXERIL) 10 MG tablet as needed.  05/10/18   [provider]  diclofenac (VOLTAREN) 50 MG EC tablet TK 1 T PO TID PRN 09/27/16   [provider]  EPINEPHrine  (EPIPEN  2-PAK) 0.3 mg/0.3 mL IJ SOAJ injection Use as directed for severe allergic reactions 07/04/18   Asa Aloysius LABOR, MD  estradiol (ESTRACE) 1 MG tablet Take 1 mg by mouth daily.    [provider]  famotidine  (PEPCID ) 20 MG tablet Take 20 mg by mouth 2 (two) times daily.    [provider]  fluticasone  (FLONASE ) 50 MCG/ACT nasal spray SHAKE LQ AND U 2 SPRAYS IEN D 09/06/18   [provider]  fluticasone  (FLOVENT  HFA) 110 MCG/ACT inhaler Inhale 2 puffs into the lungs 2 (two) times daily. 09/29/18   Luke Orlan HERO, DO  folic acid (FOLVITE) 400 MCG tablet Take 400 mcg by mouth daily.    [provider]  furosemide (LASIX) 40 MG tablet TK 1 T PO  every other day  01/01/15   [provider]  hydrOXYzine  (ATARAX /VISTARIL ) 25 MG tablet Take 1 tablet (25 mg total) by mouth at bedtime. 07/04/18   Asa Aloysius LABOR, MD  loratadine (CLARITIN) 10 MG tablet TK 1 T PO D PRN 08/23/18   [provider]  montelukast  (SINGULAIR ) 10 MG tablet TAKE 1 TABLET BY MOUTH DAILY TO PREVENT COUGH OR WHEEZING 07/23/19   Luke Orlan HERO, DO  ondansetron  (ZOFRAN -ODT) 8 MG disintegrating tablet DIS 1 T ON THE TONGUE Q 8 H PRN 09/27/16   [provider]  promethazine (PHENERGAN) 50 MG tablet TK 1 T PO BID 08/25/18   [provider]  thyroid   (ARMOUR) 120 MG tablet Take 1 tablet by mouth daily. 05/16/20   [provider]  traMADol  (ULTRAM ) 50 MG tablet TAKE ONE TO TWO TABLETS BY MOUTH FOUR TIMES DAILY AS NEEDED 07/27/18   [provider]  Vitamin D, Ergocalciferol, (DRISDOL) 50000 units CAPS capsule Take 50,000 Units by mouth every 7 (seven) days.  06/02/16   [provider]    Allergies: Codeine, Darvocet [propoxyphene n-acetaminophen], Percocet [oxycodone-acetaminophen], Tylenol [acetaminophen], and Mirabegron    Review of Systems  Gastrointestinal:  Positive for abdominal pain.    Updated Vital Signs BP (!) 145/73   Pulse 66   Temp 97.8 F (36.6 C)   Resp 19   SpO2 99%   Physical Exam Vitals and nursing note reviewed.  Constitutional:      General: She is not in acute distress.    Appearance: She is well-developed.  HENT:     Head: Normocephalic and atraumatic.   Eyes:     Pupils: Pupils are equal, round, and reactive to light.   Neck:     Comments: No swelling noted to the anterior neck Cardiovascular:     Rate and Rhythm: Normal rate and regular rhythm.     Pulses: Normal pulses.     Heart sounds: Normal heart sounds. No murmur heard.    No friction rub.  Pulmonary:     Effort: Pulmonary effort is normal.     Breath sounds: Normal breath sounds. No wheezing or rales.  Chest:     Chest wall: Tenderness present.   Abdominal:     General: Bowel sounds are normal. There is no distension.     Palpations: Abdomen is soft.     Tenderness: There is no abdominal tenderness. There is no guarding or rebound.   Musculoskeletal:        General: No tenderness. Normal range of motion.     Comments: Feet are cold to the touch but pulses are intact bilaterally.  No edema   Skin:    General: Skin is warm and dry.     Findings: No rash.   Neurological:     Mental Status: She is alert and oriented to person, place, and time. Mental status is at baseline.     Cranial Nerves: No cranial  nerve deficit.   Psychiatric:        Behavior: Behavior normal.     (all labs ordered are listed, but only abnormal results are displayed) Labs Reviewed  COMPREHENSIVE METABOLIC PANEL WITH GFR - Abnormal; Notable for the following components:      Result Value   AST 11 (*)    All other components within normal limits  CBC WITH DIFFERENTIAL/PLATELET  LIPASE, BLOOD  D-DIMER, QUANTITATIVE (NOT AT [facility])  TROPONIN T, HIGH SENSITIVITY    EKG: EKG Interpretation Date/Time:  Tuesday November 29 2023 12:12:01 EDT Ventricular Rate:  73 PR Interval:  150 QRS Duration:  86 QT Interval:  354 QTC Calculation: 389 R Axis:   33  Text Interpretation: Normal sinus rhythm Normal ECG When compared with ECG of 13-Feb-2015 17:25, No significant change was found Confirmed by Doretha Folks ([id number]) on 11/29/2023 1:43:18 PM  Radiology: [facility] Chest Port 1 View Result Date: 11/29/2023 CLINICAL DATA:  Right chest/rib pain. EXAM: PORTABLE CHEST 1 VIEW COMPARISON:  01/18/2020 FINDINGS: The lungs appear clear. Cardiac and mediastinal contours normal. No blunting of the costophrenic angles. No significant bony findings. IMPRESSION: 1. No active cardiopulmonary disease is radiographically apparent. Electronically Signed   By: Ryan Salvage M.D.   On: 11/29/2023 14:40     Procedures   Medications Ordered in the ED - No data to display  Clinical Course as of 11/29/23 1518  Tue Nov 29, 2023  1506 Received sign out from Dr. Doretha pending D-dimer. Presenting with right sided pain, foot pain. No abdominal tenderness. Labs otherwise reassuring.  [WS]    Clinical Course User Index [WS] Francesca Elsie CROME, MD                                 Medical Decision Making Amount and/or Complexity of Data Reviewed Labs: ordered. Decision-making details documented in ED Course. Radiology: ordered and independent interpretation performed. Decision-making details documented in ED Course.   Pt with multiple  medical problems and comorbidities and presenting today with a complaint that caries a high risk for morbidity and mortality.  Here today with the above complaints.  Patient is moderate risk Wells score given tobacco use and daily hormone use with this location of pain but also could be infectious in nature as patient is not a tobacco user or pleurisy related to allergy related issues.  Low suspicion for zoster.  Patient's abdomen is soft and no significant right upper quadrant pain to suggest cholecystitis.  Also will ensure no evidence of hypokalemia or electrolyte disturbance.  Lower suspicion for ACS, dissection, myocarditis or pericarditis. I independently interpreted patient's labs and EKG.  EKG is within normal limits today.  No evidence of dysrhythmia or abnormality. I independently interpreted pt's labs and cbc, trop, bmp, lipase wnl.  I have independently visualized and interpreted pt's images today.  Cxr wnl.     Final diagnoses:  None    ED Discharge Orders     None          Doretha Folks, MD 11/29/23 [phone]

## 2023-11-29 NOTE — Discharge Instructions (Signed)
 We evaluated you for your chest and abdominal pain as well as your foot pain.  Your testing in the emergency department is reassuring.  We did not see any signs of a blood clot.  Your blood tests were otherwise reassuring.  Please take Tylenol (acetaminophen) and Motrin  (ibuprofen ) for your symptoms at home.  You can take 1000 mg of Tylenol every 6 hours and 600 mg of Motrin  every 6 hours as needed for your symptoms.  You can take these medicines together as needed, either at the same time, or alternating every 3 hours.  Please keep your legs elevated as well, you can also apply ice and wear compression stockings to help with your symptoms.  Please follow-up closely with your primary doctor.  Please return if you have any new or worsening symptoms.

## 2024-02-28 ENCOUNTER — Emergency Department (HOSPITAL_BASED_OUTPATIENT_CLINIC_OR_DEPARTMENT_OTHER)

## 2024-02-28 ENCOUNTER — Emergency Department (HOSPITAL_COMMUNITY)

## 2024-02-28 ENCOUNTER — Emergency Department (HOSPITAL_BASED_OUTPATIENT_CLINIC_OR_DEPARTMENT_OTHER)
Admission: EM | Admit: 2024-02-28 | Discharge: 2024-02-28 | Disposition: A | Discharge status: Home / Self Care | Attending: Emergency Medicine | Admitting: Emergency Medicine

## 2024-02-28 ENCOUNTER — Other Ambulatory Visit: Payer: Self-pay

## 2024-02-28 ENCOUNTER — Encounter (HOSPITAL_BASED_OUTPATIENT_CLINIC_OR_DEPARTMENT_OTHER): Payer: Self-pay

## 2024-02-28 DIAGNOSIS — E039 Hypothyroidism, unspecified: Secondary | ICD-10-CM | POA: Diagnosis not present

## 2024-02-28 DIAGNOSIS — M545 Low back pain, unspecified: Secondary | ICD-10-CM | POA: Diagnosis present

## 2024-02-28 DIAGNOSIS — R200 Anesthesia of skin: Secondary | ICD-10-CM | POA: Diagnosis not present

## 2024-02-28 DIAGNOSIS — M5442 Lumbago with sciatica, left side: Secondary | ICD-10-CM | POA: Diagnosis not present

## 2024-02-28 DIAGNOSIS — R1032 Left lower quadrant pain: Secondary | ICD-10-CM | POA: Insufficient documentation

## 2024-02-28 DIAGNOSIS — M5432 Sciatica, left side: Secondary | ICD-10-CM

## 2024-02-28 DIAGNOSIS — I10 Essential (primary) hypertension: Secondary | ICD-10-CM | POA: Insufficient documentation

## 2024-02-28 DIAGNOSIS — M79605 Pain in left leg: Secondary | ICD-10-CM | POA: Diagnosis not present

## 2024-02-28 LAB — CBC
HCT: 41.8 % (ref 36.0–46.0)
Hemoglobin: 13.8 g/dL (ref 12.0–15.0)
MCH: 28.4 pg (ref 26.0–34.0)
MCHC: 33 g/dL (ref 30.0–36.0)
MCV: 86 fL (ref 80.0–100.0)
Platelets: 321 K/uL (ref 150–400)
RBC: 4.86 MIL/uL (ref 3.87–5.11)
RDW: 14.2 % (ref 11.5–15.5)
WBC: 9.5 K/uL (ref 4.0–10.5)
nRBC: 0 % (ref 0.0–0.2)

## 2024-02-28 LAB — URINE DRUG SCREEN
Amphetamines: NEGATIVE
Barbiturates: NEGATIVE
Benzodiazepines: NEGATIVE
Cocaine: NEGATIVE
Fentanyl: NEGATIVE
Methadone Scn, Ur: NEGATIVE
Opiates: NEGATIVE
Tetrahydrocannabinol: NEGATIVE

## 2024-02-28 LAB — URINALYSIS, MICROSCOPIC (REFLEX)

## 2024-02-28 LAB — COMPREHENSIVE METABOLIC PANEL WITH GFR
ALT: 11 U/L (ref 0–44)
AST: 22 U/L (ref 15–41)
Albumin: 3.6 g/dL (ref 3.5–5.0)
Alkaline Phosphatase: 61 U/L (ref 38–126)
Anion gap: 13 (ref 5–15)
BUN: 15 mg/dL (ref 6–20)
CO2: 23 mmol/L (ref 22–32)
Calcium: 9.1 mg/dL (ref 8.9–10.3)
Chloride: 103 mmol/L (ref 98–111)
Creatinine, Ser: 0.96 mg/dL (ref 0.44–1.00)
GFR, Estimated: >60 mL/min (ref >60)
Glucose, Bld: 97 mg/dL (ref 70–99)
Potassium: 3.8 mmol/L (ref 3.5–5.1)
Sodium: 139 mmol/L (ref 135–145)
Total Bilirubin: 0.3 mg/dL (ref 0.0–1.2)
Total Protein: 7.5 g/dL (ref 6.5–8.1)

## 2024-02-28 LAB — DIFFERENTIAL
Abs Immature Granulocytes: 0.06 K/uL (ref 0.00–0.07)
Basophils Absolute: 0.1 K/uL (ref 0.0–0.1)
Basophils Relative: 1 %
Eosinophils Absolute: 0.1 K/uL (ref 0.0–0.5)
Eosinophils Relative: 1 %
Immature Granulocytes: 1 %
Lymphocytes Relative: 37 %
Lymphs Abs: 3.5 K/uL (ref 0.7–4.0)
Monocytes Absolute: 0.7 K/uL (ref 0.1–1.0)
Monocytes Relative: 7 %
Neutro Abs: 5.1 K/uL (ref 1.7–7.7)
Neutrophils Relative %: 53 %

## 2024-02-28 LAB — APTT: aPTT: 28 s (ref 24–36)

## 2024-02-28 LAB — URINALYSIS, ROUTINE W REFLEX MICROSCOPIC
Bilirubin Urine: NEGATIVE
Glucose, UA: NEGATIVE mg/dL
Ketones, ur: NEGATIVE mg/dL
Leukocytes,Ua: NEGATIVE
Nitrite: NEGATIVE
Protein, ur: NEGATIVE mg/dL
Specific Gravity, Urine: 1.02 (ref 1.005–1.030)
pH: 7 (ref 5.0–8.0)

## 2024-02-28 LAB — CBG MONITORING, ED: Glucose-Capillary: 123 mg/dL — ABNORMAL HIGH (ref 70–99)

## 2024-02-28 LAB — PROTIME-INR
INR: 0.9 (ref 0.8–1.2)
Prothrombin Time: 13 s (ref 11.4–15.2)

## 2024-02-28 LAB — ETHANOL: Alcohol, Ethyl (B): <15 mg/dL (ref <15)

## 2024-02-28 LAB — HCG, SERUM, QUALITATIVE: Preg, Serum: NEGATIVE

## 2024-02-28 MED ORDER — HYDROMORPHONE HCL 1 MG/ML IJ SOLN
1.0000 mg | Freq: Once | INTRAMUSCULAR | Status: AC
  Administered 2024-02-28: 1 mg via INTRAVENOUS
  Filled 2024-02-28: qty 1

## 2024-02-28 MED ORDER — OXYCODONE HCL 5 MG PO TABS
5.0000 mg | ORAL_TABLET | Freq: Once | ORAL | Status: AC
  Administered 2024-02-28: 5 mg via ORAL
  Filled 2024-02-28: qty 1

## 2024-02-28 MED ORDER — SODIUM CHLORIDE 0.9 % IV BOLUS
1000.0000 mL | Freq: Once | INTRAVENOUS | Status: AC
  Administered 2024-02-28: 1000 mL via INTRAVENOUS

## 2024-02-28 MED ORDER — LIDOCAINE 5 % EX PTCH: 1.0000 | MEDICATED_PATCH | CUTANEOUS | 0 refills | Status: AC

## 2024-02-28 MED ORDER — KETOROLAC TROMETHAMINE 15 MG/ML IJ SOLN
15.0000 mg | Freq: Once | INTRAMUSCULAR | Status: AC
  Administered 2024-02-28: 15 mg via INTRAVENOUS
  Filled 2024-02-28: qty 1

## 2024-02-28 MED ORDER — METHOCARBAMOL 500 MG PO TABS: 500.0000 mg | ORAL_TABLET | Freq: Two times a day (BID) | ORAL | 0 refills | Status: AC

## 2024-02-28 MED ORDER — ONDANSETRON HCL 4 MG/2ML IJ SOLN
4.0000 mg | Freq: Once | INTRAMUSCULAR | Status: AC
  Administered 2024-02-28: 4 mg via INTRAVENOUS

## 2024-02-28 MED ORDER — GADOBUTROL 1 MMOL/ML IV SOLN
10.0000 mL | Freq: Once | INTRAVENOUS | Status: AC | PRN
  Administered 2024-02-28: 10 mL via INTRAVENOUS

## 2024-02-28 MED ORDER — LIDOCAINE 5 % EX PTCH
1.0000 | MEDICATED_PATCH | CUTANEOUS | Status: DC
  Administered 2024-02-28: 1 via TRANSDERMAL
  Filled 2024-02-28: qty 1

## 2024-02-28 MED ORDER — KETOROLAC TROMETHAMINE 15 MG/ML IJ SOLN
15.0000 mg | Freq: Once | INTRAMUSCULAR | Status: AC
Start: 2024-02-28 — End: 2024-02-28
  Administered 2024-02-28: 15 mg via INTRAVENOUS
  Filled 2024-02-28: qty 1

## 2024-02-28 MED ORDER — ONDANSETRON HCL 4 MG/2ML IJ SOLN
INTRAMUSCULAR | Status: AC
  Filled 2024-02-28: qty 2

## 2024-02-28 MED ORDER — IOHEXOL 300 MG/ML  SOLN
100.0000 mL | Freq: Once | INTRAMUSCULAR | Status: AC | PRN
  Administered 2024-02-28: 100 mL via INTRAVENOUS

## 2024-02-28 MED ORDER — METHOCARBAMOL 500 MG PO TABS
500.0000 mg | ORAL_TABLET | Freq: Once | ORAL | Status: AC
  Administered 2024-02-28: 500 mg via ORAL
  Filled 2024-02-28: qty 1

## 2024-02-28 NOTE — ED Notes (Addendum)
 This nurse was notified by xray that IV in right [facility] was no longer working. New IV site obtained by imaging staff.  This nurse went into patients room to access the new IV and to round on pt. Pt states that she would like to request a new nurse because she doesn't feel that what this nurse is doing is appropriate.   Notified charge Environmental manager.

## 2024-02-28 NOTE — ED Triage Notes (Signed)
 Pt presents with left sided weakness & numbness that started this am at 0300. Denies Dizziness/numbness.

## 2024-02-28 NOTE — ED Provider Notes (Signed)
 MSE was initiated and I personally evaluated the patient and placed orders (if any) at  10:22 AM on February 28, 2024.  I was asked to see patient due to left arm weakness.  Talking to patient, her left leg has been weak since yesterday morning around 6 or 7 AM.  It is also painful and there is pain in the left side of her back.  Went to bed around 6 or 7 PM last night and then around 3 AM woke up and her left arm has been tingly and numb and weak as well.  There is tingling and numbness to her left face.  No headache or neck pain.  Will order stroke workup given the unilateral symptoms but she is well outside any thrombolytic window.  There is no indication this is an LVO at this time.  The patient appears stable so that the remainder of the MSE may be completed by another provider.   Freddi Hamilton, MD 02/28/24 [phone]

## 2024-02-28 NOTE — ED Notes (Signed)
 Pt departed ER at this time by POV/daughter to [facility]

## 2024-02-28 NOTE — ED Provider Notes (Signed)
  EMERGENCY DEPARTMENT AT [facility] Provider Note   CSN: [id number] Arrival date & time: 02/28/24  9041     Patient presents with: Weakness   Nicole Nicholson is a 43 y.o. female.   HPI 43 year old female with a history of hypothyroidism and pulmonary hypertension presents with chief complaint of left-sided weakness.  Patient states that yesterday she noticed back pain in her left low back as well as her left leg being weak, numb, and very painful.  This started around 6 AM.  This morning when she woke up around 3 AM her left arm and face were numb and tingly.  Feels weaker than normal on her left arm as well.  No headache or neck pain.  This morning when she went to the bathroom she noticed some left lower quadrant abdominal pain that is still present.  She feels like her left calf is the area of most pain.  Right now the pain is less in her back but more in her left hip.  She deals with pain in her back from time to time but nothing like this and never with any weakness or leg symptoms. Pain is currently severe.  Prior to Admission medications   Medication Sig Start Date End Date Taking? Authorizing Provider  albuterol (PROVENTIL HFA;VENTOLIN HFA) 108 (90 Base) MCG/ACT inhaler INL 2 PFS PO QID PRN 04/27/17   [provider]  ALPRAZolam (XANAX) 0.5 MG tablet TK 1 T PO TWO TIMES DAILY AS NEED 07/05/18   [provider]  cetirizine  (ZYRTEC ) 10 MG tablet Take 1 tablet (10 mg total) by mouth every morning. Patient taking differently: Take 10 mg by mouth daily as needed. 07/04/18   Asa Aloysius LABOR, MD  clindamycin (CLINDAGEL) 1 % gel APP EXT AA BID 07/15/18   [provider]  clotrimazole-betamethasone (LOTRISONE) cream APP EXT AA BID 06/23/18   [provider]  cyclobenzaprine (FLEXERIL) 10 MG tablet as needed.  05/10/18   [provider]  diclofenac (VOLTAREN) 50 MG EC tablet TK 1 T PO TID PRN 09/27/16   [provider]   EPINEPHrine  (EPIPEN  2-PAK) 0.3 mg/0.3 mL IJ SOAJ injection Use as directed for severe allergic reactions 07/04/18   Asa Aloysius LABOR, MD  estradiol (ESTRACE) 1 MG tablet Take 1 mg by mouth daily.    [provider]  famotidine  (PEPCID ) 20 MG tablet Take 20 mg by mouth 2 (two) times daily.    [provider]  fexofenadine (ALLEGRA) 180 MG tablet Take 180 mg by mouth daily. 10/31/19   [provider]  fluticasone  (FLONASE ) 50 MCG/ACT nasal spray SHAKE LQ AND U 2 SPRAYS IEN D 09/06/18   [provider]  fluticasone  (FLOVENT  HFA) 110 MCG/ACT inhaler Inhale 2 puffs into the lungs 2 (two) times daily. 09/29/18   Luke Orlan HERO, DO  folic acid (FOLVITE) 400 MCG tablet Take 400 mcg by mouth daily.    [provider]  furosemide (LASIX) 40 MG tablet TK 1 T PO  every other day 01/01/15   [provider]  hydrOXYzine  (ATARAX /VISTARIL ) 25 MG tablet Take 1 tablet (25 mg total) by mouth at bedtime. 07/04/18   Asa Aloysius LABOR, MD  loratadine (CLARITIN) 10 MG tablet TK 1 T PO D PRN 08/23/18   [provider]  montelukast  (SINGULAIR ) 10 MG tablet TAKE 1 TABLET BY MOUTH DAILY TO PREVENT COUGH OR WHEEZING 07/23/19   Luke Orlan HERO, DO  ondansetron  (ZOFRAN -ODT) 8 MG disintegrating tablet DIS  1 T ON THE TONGUE Q 8 H PRN 09/27/16   [provider]  promethazine (PHENERGAN) 50 MG tablet TK 1 T PO BID 08/25/18   [provider]  thyroid  (ARMOUR) 120 MG tablet Take 1 tablet by mouth daily. 05/16/20   [provider]  traMADol  (ULTRAM ) 50 MG tablet TAKE ONE TO TWO TABLETS BY MOUTH FOUR TIMES DAILY AS NEEDED 07/27/18   [provider]  Vitamin D, Ergocalciferol, (DRISDOL) 50000 units CAPS capsule Take 50,000 Units by mouth every 7 (seven) days.  06/02/16   [provider]    Allergies: Codeine, Darvocet [propoxyphene n-acetaminophen], Percocet [oxycodone -acetaminophen], Tylenol [acetaminophen], and Mirabegron    Review of Systems   Respiratory:  Negative for shortness of breath.   Cardiovascular:  Negative for chest pain.  Gastrointestinal:  Positive for abdominal pain.  Musculoskeletal:  Positive for back pain. Negative for neck pain.  Neurological:  Positive for weakness and numbness. Negative for headaches.    Updated Vital Signs BP (!) 160/70   Pulse 61   Temp 98.2 F (36.8 C) (Oral)   Resp 17   Ht 5' 7 (1.702 m)   Wt 112 kg   SpO2 95%   BMI 38.69 kg/m   Physical Exam Vitals and nursing note reviewed.  Constitutional:      Appearance: She is well-developed.  HENT:     Head: Normocephalic and atraumatic.  Cardiovascular:     Rate and Rhythm: Normal rate and regular rhythm.     Pulses:          Radial pulses are 2+ on the left side.       Dorsalis pedis pulses are 2+ on the left side.     Heart sounds: Normal heart sounds.  Pulmonary:     Effort: Pulmonary effort is normal.     Breath sounds: Normal breath sounds.  Abdominal:     Palpations: Abdomen is soft.     Tenderness: There is abdominal tenderness in the left lower quadrant.  Musculoskeletal:     Cervical back: No tenderness.     Thoracic back: No tenderness.     Lumbar back: Tenderness present.       Back:     Comments: The left lower leg seems more swollen than the right, though there seems to be a chronic component based on patient description. The left calf is tender to palpation.  There is decreased range of motion of both the knee and the hip and there is severe pain when I try to lift her leg.  Skin:    General: Skin is warm and dry.  Neurological:     Mental Status: She is alert.     Comments: Patient has normal extraocular movements.  No facial droop or slurred speech.  She feels a subjective decrease sensation on the left forehead and left face.  Decreased sensation to the left arm and left leg compared to the right side.  She is able to hold up both arms against gravity without difficulty but seems to have decreased  strength when using her left arm and left grip.  Her left leg is unable to be lifted off the bed though it seems like there is also a pain component to this.     (all labs ordered are listed, but only abnormal results are displayed) Labs Reviewed  CBG MONITORING, ED - Abnormal; Notable for the following components:      Result Value   Glucose-Capillary 123 (*)  All other components within normal limits  ETHANOL  PROTIME-INR  APTT  CBC  DIFFERENTIAL  COMPREHENSIVE METABOLIC PANEL WITH GFR  HCG, SERUM, QUALITATIVE  URINE DRUG SCREEN  URINALYSIS, ROUTINE W REFLEX MICROSCOPIC    EKG: EKG Interpretation Date/Time:  Tuesday February 28 2024 10:10:43 EDT Ventricular Rate:  90 PR Interval:  149 QRS Duration:  93 QT Interval:  342 QTC Calculation: 419 R Axis:   45  Text Interpretation: Sinus rhythm Abnormal R-wave progression, early transition no significant change since Jun 2025 Confirmed by Freddi Hamilton [phone]) on 02/28/2024 10:54:30 AM  Radiology: CT ABDOMEN PELVIS W CONTRAST Result Date: 02/28/2024 CLINICAL DATA:  LLQ abdominal pain. EXAM: CT ABDOMEN AND PELVIS WITH CONTRAST TECHNIQUE: Multidetector CT imaging of the abdomen and pelvis was performed using the standard protocol following bolus administration of intravenous contrast. RADIATION DOSE REDUCTION: This exam was performed according to the departmental dose-optimization program which includes automated exposure control, adjustment of the mA and/or kV according to patient size and/or use of iterative reconstruction technique. CONTRAST:  OMNIPAQUE  IOHEXOL  300 MG/ML  SOLN COMPARISON:  None Available. FINDINGS: Evaluation is mild-to-moderately degraded by patient's motion during data acquisition. Lower chest: There are subpleural atelectatic changes in the visualized lung bases. No overt consolidation. No pleural effusion. The heart is normal in size. No pericardial effusion. Hepatobiliary: The liver is normal in size.  Non-cirrhotic configuration. No suspicious mass. There is a single well-circumscribed 9 x 13 mm cyst in the right hepatic lobe, segment 7. There is a hypoattenuating approximately 2.0 x 2.2 cm subcapsular lesion in the left hepatic lobe, segment 2, which can not be characterized as a simple cyst on the basis of this exam. This is indeterminate. Further evaluation with nonemergent MRI abdomen as per liver mass protocol is recommended. No intrahepatic or extrahepatic bile duct dilation. No calcified gallstones. Normal gallbladder wall thickness. No pericholecystic inflammatory changes. Pancreas: Unremarkable. No pancreatic ductal dilatation or surrounding inflammatory changes. Spleen: Within normal limits. No focal lesion. Adrenals/Urinary Tract: Adrenal glands are unremarkable. No suspicious renal mass. No hydronephrosis. No renal or ureteric calculi. Urinary bladder is under distended, precluding optimal assessment. However, no large mass or stones identified. No perivesical fat stranding. Stomach/Bowel: No disproportionate dilation of the small or large bowel loops. No evidence of abnormal bowel wall thickening or inflammatory changes. The appendix is unremarkable. Vascular/Lymphatic: There is trace amount of free fluid in the dependent pelvis, which may be physiological. No pneumoperitoneum. No abdominal or pelvic lymphadenopathy, by size criteria. No aneurysmal dilation of the major abdominal arteries. Reproductive: Not well evaluated on the CT scan exam. However, having said that there is probable supracervical hysterectomy. Correlate with surgical history. No adnexal mass seen. No suspicious lesion in the pelvis. Other: There is a tiny fat containing umbilical hernia. The soft tissues and abdominal wall are otherwise unremarkable. Musculoskeletal: No suspicious osseous lesions. There are mild multilevel degenerative changes in the visualized spine. IMPRESSION: 1. No acute inflammatory process identified within  the abdomen or pelvis. 2. There is a hypoattenuating approximately 2.0 x 2.2 cm subcapsular lesion in the left hepatic lobe, segment 2, which can not be characterized as a simple cyst on the basis of this exam. Further evaluation with nonemergent MRI abdomen as per liver mass protocol is recommended. 3. Multiple other nonacute observations, as described above. Electronically Signed   By: Ree Molt M.D.   On: 02/28/2024 13:37   US  Venous Img Lower Unilateral Left Result Date: 02/28/2024 CLINICAL DATA:  LEFT lower  extremity pain since yesterday EXAM: LEFT LOWER EXTREMITY VENOUS DOPPLER ULTRASOUND TECHNIQUE: Gray-scale sonography with compression, as well as color and duplex ultrasound, were performed to evaluate the deep venous system(s) from the level of the common femoral vein through the popliteal and proximal calf veins. COMPARISON:  11/29/2023 FINDINGS: VENOUS Normal compressibility of the common femoral, superficial femoral, and popliteal veins, as well as the visualized calf veins. Visualized portions of profunda femoral vein and great saphenous vein unremarkable. No filling defects to suggest DVT on grayscale or color Doppler imaging. Doppler waveforms show normal direction of venous flow, normal respiratory plasticity and response to augmentation. Limited views of the contralateral common femoral vein are unremarkable. OTHER None. Limitations: none IMPRESSION: No DVT of the left lower extremity. Electronically Signed   By: Aliene Lloyd M.D.   On: 02/28/2024 12:30   CT HEAD WO CONTRAST Result Date: 02/28/2024 CLINICAL DATA:  Provided history: Neuro deficit, acute, stroke suspected. Additional history provided: Left-sided weakness and numbness. EXAM: CT HEAD WITHOUT CONTRAST TECHNIQUE: Contiguous axial images were obtained from the base of the skull through the vertex without intravenous contrast. RADIATION DOSE REDUCTION: This exam was performed according to the departmental dose-optimization  program which includes automated exposure control, adjustment of the mA and/or kV according to patient size and/or use of iterative reconstruction technique. COMPARISON:  MRV head 10/30/2019.  Brain MRI 07/09/2016. FINDINGS: Brain: Cerebral volume is normal. Expanded and partially empty sella turcica. There is no acute intracranial hemorrhage. No demarcated cortical infarct. No extra-axial fluid collection. No evidence of an intracranial mass. No midline shift. Vascular: No hyperdense vessel. Skull: No calvarial fracture or aggressive osseous lesion. Sinuses/Orbits: No mass or acute finding within the imaged orbits. Tiny mucous retention cyst within the left maxillary sinus at the imaged levels. Small volume secretions within right ethmoid air cells. IMPRESSION: 1. No evidence of an acute intracranial abnormality. 2. Expanded and partially empty sella turcica. This finding can reflect incidental anatomic variation, or alternatively, it can be associated with chronic idiopathic intracranial hypertension (pseudotumor cerebri). 3. Minor paranasal sinus disease at the imaged levels, as described. Electronically Signed   By: Rockey Childs D.O.   On: 02/28/2024 11:23     Procedures   Medications Ordered in the ED  HYDROmorphone  (DILAUDID ) injection 1 mg (1 mg Intravenous Given 02/28/24 1120)  iohexol  (OMNIPAQUE ) 300 MG/ML solution 100 mL (100 mLs Intravenous Contrast Given 02/28/24 1226)  ketorolac  (TORADOL ) 15 MG/ML injection 15 mg (15 mg Intravenous Given 02/28/24 1323)  HYDROmorphone  (DILAUDID ) injection 1 mg (1 mg Intravenous Given 02/28/24 1323)  sodium chloride  0.9 % bolus 1,000 mL (1,000 mLs Intravenous New Bag/Given 02/28/24 1349)                                    Medical Decision Making Amount and/or Complexity of Data Reviewed External Data Reviewed: notes. Labs: ordered.    Details: Normal WBC Radiology: ordered and independent interpretation performed.    Details: No head bleed.  No  diverticulitis. ECG/medicine tests: ordered and independent interpretation performed.    Details: Sinus rhythm  Risk Prescription drug management.   Patient presents with multiple symptoms.  I think her leg is coming from her back primarily.  She is not having any incontinence.  Did seem like she is moving her leg a little better after some pain control but now states that she is unable to move her leg again.  The upper extremity and facial symptoms are atypical and there is minimal weakness noted but these are also new.  CT imaging is benign for an acute emergent condition.  I have discussed with her the need for an outpatient MRI for her abdomen.  I think she will need MRI for her back and I discussed with neuro, Dr. Matthews, who is advising MRI brain with and without to help for her arm and face symptoms.  If this is negative she could have an outpatient neurowork-up.  Will need reassessment.  Discussed with Dr. Dreama who accepts in transfer to [facility].  Patient wants to go POV and has a friend to take her.     Final diagnoses:  None    ED Discharge Orders     None          Freddi Hamilton, MD 02/28/24 1418

## 2024-02-28 NOTE — Discharge Instructions (Addendum)
 Thank you for coming to [facility] Emergency Department. You were seen for left buttock/leg pain as well as left-sided numbness. You had workup in the ED that showed: -Subcapsular liver lesion that can be followed up outpatient with nonemergent abdominal MRI. -Microvascular ischemic changes in the brain MRI  No strokes or kidney infection/stone were found.   It is possible that your symptoms may be due to sciatica. Please take ibuprofen  600 mg every 8 hours for one week and use methocarbamol  (robaxin ) for muscle relaxer 500 mg every 12 hours. Massage, stretching, and heat/ice can also help. You can also use lidocaine  patches once per day.  Please follow up with your primary care provider within 1 week.   Your CT scan showed a lesion on your liver that will need to be followed up as an outpatient with an MRI.  Your primary care doctor can arrange this.  We have provided a referral to [facility] to make a follow up appointment about your MRI results as well as your numbness. Please schedule an appointment for sometime in the next 1-2 weeks.   Do not hesitate to return to the ED or call 911 if you experience: -Worsening symptoms -Lightheadedness, passing out -Fevers/chills -Anything else that concerns you

## 2024-02-28 NOTE — ED Notes (Signed)
 Patient here in waiting room. MRI notified of patient here. IV saline locked and vitals updated.

## 2024-02-28 NOTE — ED Notes (Addendum)
 Pt noted to be rude to this nurse and new grad RN taking care of her.  Pt proceeded to say that the new grad needed to talk more and to tell her what she is going to do  This nurse spoke up to patient and affirmed that the new grad is learning and all proper channels in her care are being taken.

## 2024-02-28 NOTE — ED Notes (Signed)
 Assuming pt care at this time.

## 2024-02-28 NOTE — ED Notes (Signed)
 Pt transported to MRI

## 2024-07-11 ENCOUNTER — Encounter: Payer: Self-pay | Admitting: Gastroenterology
